# Patient Record
Sex: Male | Born: 1935 | Race: White | Hispanic: No | Marital: Married | Smoking: Former smoker
Health system: Southern US, Community
[De-identification: ages and names within clinical notes are randomized; demographics above are authoritative.]

## PROBLEM LIST (undated history)

## (undated) DIAGNOSIS — R0602 Shortness of breath: Secondary | ICD-10-CM

## (undated) DIAGNOSIS — I4891 Unspecified atrial fibrillation: Secondary | ICD-10-CM

## (undated) DIAGNOSIS — R1032 Left lower quadrant pain: Secondary | ICD-10-CM

## (undated) DIAGNOSIS — R739 Hyperglycemia, unspecified: Principal | ICD-10-CM

## (undated) DIAGNOSIS — I639 Cerebral infarction, unspecified: Secondary | ICD-10-CM

## (undated) DIAGNOSIS — H6120 Impacted cerumen, unspecified ear: Secondary | ICD-10-CM

## (undated) DIAGNOSIS — I714 Abdominal aortic aneurysm, without rupture, unspecified: Secondary | ICD-10-CM

## (undated) DIAGNOSIS — R29898 Other symptoms and signs involving the musculoskeletal system: Principal | ICD-10-CM

## (undated) DIAGNOSIS — S60519A Abrasion of unspecified hand, initial encounter: Secondary | ICD-10-CM

## (undated) DIAGNOSIS — I350 Nonrheumatic aortic (valve) stenosis: Secondary | ICD-10-CM

## (undated) DIAGNOSIS — M79604 Pain in right leg: Secondary | ICD-10-CM

## (undated) DIAGNOSIS — G459 Transient cerebral ischemic attack, unspecified: Secondary | ICD-10-CM

## (undated) DIAGNOSIS — G609 Hereditary and idiopathic neuropathy, unspecified: Secondary | ICD-10-CM

## (undated) DIAGNOSIS — R269 Unspecified abnormalities of gait and mobility: Secondary | ICD-10-CM

## (undated) DIAGNOSIS — E785 Hyperlipidemia, unspecified: Secondary | ICD-10-CM

## (undated) DIAGNOSIS — R2681 Unsteadiness on feet: Secondary | ICD-10-CM

## (undated) DIAGNOSIS — M48061 Spinal stenosis, lumbar region without neurogenic claudication: Secondary | ICD-10-CM

## (undated) DIAGNOSIS — M1990 Unspecified osteoarthritis, unspecified site: Secondary | ICD-10-CM

## (undated) DIAGNOSIS — G4700 Insomnia, unspecified: Secondary | ICD-10-CM

## (undated) DIAGNOSIS — I5032 Chronic diastolic (congestive) heart failure: Secondary | ICD-10-CM

## (undated) DIAGNOSIS — Z0000 Encounter for general adult medical examination without abnormal findings: Secondary | ICD-10-CM

## (undated) DIAGNOSIS — R1031 Right lower quadrant pain: Secondary | ICD-10-CM

## (undated) DIAGNOSIS — I2510 Atherosclerotic heart disease of native coronary artery without angina pectoris: Secondary | ICD-10-CM

## (undated) DIAGNOSIS — F329 Major depressive disorder, single episode, unspecified: Secondary | ICD-10-CM

## (undated) DIAGNOSIS — L989 Disorder of the skin and subcutaneous tissue, unspecified: Principal | ICD-10-CM

## (undated) DIAGNOSIS — R609 Edema, unspecified: Secondary | ICD-10-CM

## (undated) HISTORY — DX: Hereditary and idiopathic neuropathy, unspecified: G60.9

## (undated) HISTORY — DX: Unspecified abnormalities of gait and mobility: R26.9

## (undated) HISTORY — DX: Unspecified atrial fibrillation: I48.91

## (undated) HISTORY — DX: Hyperlipidemia, unspecified: E78.5

## (undated) HISTORY — DX: Abdominal aortic aneurysm, without rupture, unspecified: I71.40

## (undated) HISTORY — DX: Shortness of breath: R06.02

## (undated) HISTORY — DX: Cerebral infarction, unspecified: I63.9

## (undated) HISTORY — DX: Chronic diastolic (congestive) heart failure: I50.32

## (undated) HISTORY — DX: Spinal stenosis, lumbar region without neurogenic claudication: M48.061

## (undated) HISTORY — PX: SPINE SURGERY: SHX786

## (undated) HISTORY — DX: Pain in right leg: M79.604

## (undated) HISTORY — DX: Atherosclerotic heart disease of native coronary artery without angina pectoris: I25.10

## (undated) HISTORY — DX: Unspecified osteoarthritis, unspecified site: M19.90

## (undated) HISTORY — PX: LUMBAR LAMINECTOMY: SHX95

## (undated) HISTORY — DX: Transient cerebral ischemic attack, unspecified: G45.9

## (undated) HISTORY — DX: Insomnia, unspecified: G47.00

## (undated) HISTORY — PX: JOINT REPLACEMENT: SHX530

## (undated) HISTORY — PX: ACHILLES TENDON REPAIR: SUR1153

## (undated) HISTORY — PX: OTHER SURGICAL HISTORY: SHX169

## (undated) HISTORY — DX: Disorder of the skin and subcutaneous tissue, unspecified: L98.9

## (undated) HISTORY — DX: Encounter for general adult medical examination without abnormal findings: Z00.00

## (undated) HISTORY — DX: Hyperglycemia, unspecified: R73.9

## (undated) HISTORY — PX: BRAIN SURGERY: SHX531

## (undated) HISTORY — DX: Other symptoms and signs involving the musculoskeletal system: R29.898

## (undated) HISTORY — DX: Right lower quadrant pain: R10.31

## (undated) HISTORY — DX: Unsteadiness on feet: R26.81

## (undated) HISTORY — DX: Major depressive disorder, single episode, unspecified: F32.9

## (undated) HISTORY — DX: Abrasion of unspecified hand, initial encounter: S60.519A

## (undated) HISTORY — DX: Nonrheumatic aortic (valve) stenosis: I35.0

## (undated) HISTORY — DX: Abdominal aortic aneurysm, without rupture: I71.4

## (undated) HISTORY — DX: Edema, unspecified: R60.9

## (undated) HISTORY — DX: Left lower quadrant pain: R10.32

## (undated) HISTORY — PX: CARPAL TUNNEL RELEASE: SHX101

## (undated) HISTORY — DX: Impacted cerumen, unspecified ear: H61.20

---

## 2005-12-17 ENCOUNTER — Emergency Department (HOSPITAL_COMMUNITY): Admission: EM | Admit: 2005-12-17 | Discharge: 2005-12-17 | Payer: Self-pay | Admitting: *Deleted

## 2008-01-21 ENCOUNTER — Inpatient Hospital Stay (HOSPITAL_COMMUNITY): Admission: AD | Admit: 2008-01-21 | Discharge: 2008-01-22 | Payer: Self-pay | Admitting: General Surgery

## 2008-02-17 ENCOUNTER — Ambulatory Visit (HOSPITAL_BASED_OUTPATIENT_CLINIC_OR_DEPARTMENT_OTHER): Admission: RE | Admit: 2008-02-17 | Discharge: 2008-02-17 | Payer: Self-pay | Admitting: Orthopedic Surgery

## 2008-10-27 LAB — HM COLONOSCOPY

## 2008-11-06 ENCOUNTER — Emergency Department (HOSPITAL_COMMUNITY): Admission: EM | Admit: 2008-11-06 | Discharge: 2008-11-06 | Payer: Self-pay | Admitting: Emergency Medicine

## 2008-12-27 ENCOUNTER — Encounter: Admission: RE | Admit: 2008-12-27 | Discharge: 2009-01-22 | Payer: Self-pay | Admitting: Neurology

## 2009-01-22 ENCOUNTER — Encounter: Admission: RE | Admit: 2009-01-22 | Discharge: 2009-01-22 | Payer: Self-pay | Admitting: *Deleted

## 2009-08-20 ENCOUNTER — Encounter: Admission: RE | Admit: 2009-08-20 | Discharge: 2009-08-20 | Payer: Self-pay | Admitting: Internal Medicine

## 2010-03-31 ENCOUNTER — Encounter: Payer: Self-pay | Admitting: Family Medicine

## 2010-04-03 ENCOUNTER — Encounter: Admission: RE | Admit: 2010-04-03 | Discharge: 2010-04-03 | Payer: Self-pay | Admitting: Family Medicine

## 2010-04-03 ENCOUNTER — Encounter: Payer: Self-pay | Admitting: Family Medicine

## 2010-07-26 ENCOUNTER — Encounter: Admission: RE | Admit: 2010-07-26 | Discharge: 2010-08-19 | Payer: Self-pay | Admitting: Neurology

## 2010-08-12 ENCOUNTER — Ambulatory Visit: Payer: Self-pay | Admitting: Family Medicine

## 2010-08-12 DIAGNOSIS — G609 Hereditary and idiopathic neuropathy, unspecified: Secondary | ICD-10-CM

## 2010-08-12 DIAGNOSIS — H9190 Unspecified hearing loss, unspecified ear: Secondary | ICD-10-CM | POA: Insufficient documentation

## 2010-08-12 DIAGNOSIS — M48061 Spinal stenosis, lumbar region without neurogenic claudication: Secondary | ICD-10-CM

## 2010-08-12 DIAGNOSIS — I714 Abdominal aortic aneurysm, without rupture, unspecified: Secondary | ICD-10-CM | POA: Insufficient documentation

## 2010-08-12 DIAGNOSIS — G459 Transient cerebral ischemic attack, unspecified: Secondary | ICD-10-CM | POA: Insufficient documentation

## 2010-08-12 DIAGNOSIS — D126 Benign neoplasm of colon, unspecified: Secondary | ICD-10-CM | POA: Insufficient documentation

## 2010-08-12 DIAGNOSIS — R011 Cardiac murmur, unspecified: Secondary | ICD-10-CM

## 2010-08-12 DIAGNOSIS — E785 Hyperlipidemia, unspecified: Secondary | ICD-10-CM

## 2010-08-12 DIAGNOSIS — R7309 Other abnormal glucose: Secondary | ICD-10-CM

## 2010-08-12 DIAGNOSIS — Z8619 Personal history of other infectious and parasitic diseases: Secondary | ICD-10-CM

## 2010-08-12 DIAGNOSIS — Z9189 Other specified personal risk factors, not elsewhere classified: Secondary | ICD-10-CM | POA: Insufficient documentation

## 2010-08-12 DIAGNOSIS — H9319 Tinnitus, unspecified ear: Secondary | ICD-10-CM | POA: Insufficient documentation

## 2010-08-12 DIAGNOSIS — R269 Unspecified abnormalities of gait and mobility: Secondary | ICD-10-CM

## 2010-08-12 HISTORY — DX: Spinal stenosis, lumbar region without neurogenic claudication: M48.061

## 2010-08-12 HISTORY — DX: Hereditary and idiopathic neuropathy, unspecified: G60.9

## 2010-08-12 HISTORY — DX: Unspecified abnormalities of gait and mobility: R26.9

## 2010-08-12 HISTORY — DX: Transient cerebral ischemic attack, unspecified: G45.9

## 2010-09-11 ENCOUNTER — Telehealth (INDEPENDENT_AMBULATORY_CARE_PROVIDER_SITE_OTHER): Payer: Self-pay | Admitting: *Deleted

## 2010-09-25 ENCOUNTER — Encounter: Payer: Self-pay | Admitting: Family Medicine

## 2010-11-01 ENCOUNTER — Telehealth: Payer: Self-pay | Admitting: Family Medicine

## 2010-11-19 ENCOUNTER — Encounter: Payer: Self-pay | Admitting: Family Medicine

## 2010-11-20 ENCOUNTER — Encounter: Payer: Self-pay | Admitting: Family Medicine

## 2010-11-20 ENCOUNTER — Ambulatory Visit: Admission: RE | Admit: 2010-11-20 | Discharge: 2010-11-20 | Payer: Self-pay | Source: Home / Self Care

## 2010-11-26 NOTE — Miscellaneous (Signed)
Summary: Appointment Canceled  Appointment status changed to canceled by [employer] on 09/25/2010 3:03 PM.  Cancellation Comments --------------------- echo/heart murmur-mb  Appointment Information ----------------------- Appt Type:  CARDIOLOGY ANCILLARY VISIT      Date:  Friday, November 22, 2010      Time:  3:00 PM for 60 min   Urgency:  Routine   Made By:  Pearson Grippe  To Visit:  [id number]    Reason:  echo/heart murmur-mb  Appt Comments ------------- -- 09/25/10 15:03: ([facility]) CANCELED -- echo/heart murmur-mb -- 08/14/10 14:12: ([facility]) BOOKED -- Routine CARDIOLOGY ANCILLARY VISIT at 11/22/2010 3:00 PM for 60 min echo/heart murmur-mb

## 2010-11-26 NOTE — Progress Notes (Signed)
Summary: Pt called and needs clarification on letter rcvd from Dr Abner Greenspan  Phone Note Call from Patient Call back at [facility] Phone [phone]   Caller: Patient Summary of Call: Pt called and said that he has some questions re: information he needs obtain before next office visit. He rcvd a letter from Dr Abner Greenspan and pt does not understand. He is req clarification. Pls call asap.  Initial call taken by: Lucy Antigua,  September 11, 2010 11:26 AM  Follow-up for Phone Call        Patient informed. Follow-up by: Josph Macho RMA,  September 11, 2010 12:04 PM

## 2010-11-28 ENCOUNTER — Ambulatory Visit: Admit: 2010-11-28 | Payer: Self-pay | Admitting: Family Medicine

## 2010-11-28 ENCOUNTER — Other Ambulatory Visit: Payer: MEDICARE

## 2010-11-28 ENCOUNTER — Encounter (INDEPENDENT_AMBULATORY_CARE_PROVIDER_SITE_OTHER): Payer: Self-pay | Admitting: *Deleted

## 2010-11-28 ENCOUNTER — Other Ambulatory Visit: Payer: Self-pay | Admitting: Family Medicine

## 2010-11-28 DIAGNOSIS — E785 Hyperlipidemia, unspecified: Secondary | ICD-10-CM

## 2010-11-28 DIAGNOSIS — R7309 Other abnormal glucose: Secondary | ICD-10-CM

## 2010-11-28 DIAGNOSIS — R011 Cardiac murmur, unspecified: Secondary | ICD-10-CM

## 2010-11-28 LAB — BASIC METABOLIC PANEL
BUN: 21 mg/dL (ref 6–23)
Chloride: 102 mEq/L (ref 96–112)
Creatinine, Ser: 0.8 mg/dL (ref 0.4–1.5)
GFR: 97.98 mL/min (ref >60.00)
Glucose, Bld: 95 mg/dL (ref 70–99)
Sodium: 136 mEq/L (ref 135–145)

## 2010-11-28 LAB — HEPATIC FUNCTION PANEL
ALT: 17 U/L (ref 0–53)
Total Bilirubin: 0.8 mg/dL (ref 0.3–1.2)

## 2010-11-28 LAB — LIPID PANEL
Cholesterol: 141 mg/dL (ref 0–200)
LDL Cholesterol: 69 mg/dL (ref 0–99)
Total CHOL/HDL Ratio: 3
Triglycerides: 140 mg/dL (ref 0.0–149.0)
VLDL: 28 mg/dL (ref 0.0–40.0)

## 2010-11-28 LAB — CBC WITH DIFFERENTIAL/PLATELET
Basophils Absolute: 0 10*3/uL (ref 0.0–0.1)
Basophils Relative: 0.5 % (ref 0.0–3.0)
Eosinophils Absolute: 0.1 10*3/uL (ref 0.0–0.7)
MCV: 94.3 fl (ref 78.0–100.0)
Monocytes Relative: 7.9 % (ref 3.0–12.0)
Platelets: 197 10*3/uL (ref 150.0–400.0)
RBC: 4.59 Mil/uL (ref 4.22–5.81)

## 2010-11-28 NOTE — Miscellaneous (Signed)
Summary: Orders Update  Clinical Lists Changes  Orders: Added new Test order of Abdominal Aorta Duplex (Abd Aorta Duplex) - Signed 

## 2010-11-28 NOTE — Assessment & Plan Note (Signed)
Summary: NEW PT TO EST//SLM   Vital Signs:  Patient profile:   77 year old male Height:      76 inches (193.04 cm) Weight:      226 pounds (102.73 kg) BMI:     27.61 O2 Sat:      97 % on Room air Temp:     97.4 degrees F (36.33 degrees C) oral Pulse rate:   103 / minute BP sitting:   132 / 68  (left arm) Cuff size:   large  Vitals Entered By: Josph Macho RMA (August 12, 2010 2:22 PM)  O2 Flow:  Room air CC: Establish new patient/ CF Is Patient Diabetic? No   History of Present Illness: Patient in today for new patient vist. He generally feels well but does have a complex medical history. He has been through multiple orthopaedic surgeries for spinal stenosis, and arthritis. He reports surgical intervention [place] at L1 thru L4 without hardware being left in placeHas had lumbar surgeries and is struggling with peripheral neuropathy. He has noted some difficulty with unsteady gait recently due to this but no actual falls and no vertiginous or central nervous system concerns. Denies any CP/palp/SOB/f/c/GI or GU c/o today. No f/c/recent illness  Preventive Screening-Counseling & Management  Alcohol-Tobacco     Smoking Status: quit      Drug Use:  no.    Current Problems (verified): 1)  Cardiac Murmur  (ICD-785.2) 2)  Colonic Polyps  (ICD-211.3) 3)  Hyperglycemia  (ICD-790.29) 4)  Abdominal Aortic Aneurysm  (ICD-441.4) 5)  Measles, Hx of  (ICD-V12.09) 6)  Chickenpox, Hx of  (ICD-V15.9) 7)  Decreased Hearing, Right Ear  (ICD-389.9) 8)  Unsteady Gait  (ICD-781.2) 9)  Tia  (ICD-435.9) 10)  Hyperlipidemia  (ICD-272.4) 11)  Tinnitus  (ICD-388.30) 12)  Spinal Stenosis, Lumbar  (ICD-724.02) 13)  Peripheral Neuropathy  (ICD-356.9)  Current Medications (verified): 1)  Plavix 75 Mg Tabs (Clopidogrel Bisulfate) .... Once Daily 2)  Simvastatin 80 Mg Tabs (Simvastatin) .Marland Kitchen.. 1 Per Day 3)  Gabapentin 100 Mg Caps (Gabapentin) .... Three Times A Day 4)   Metanx 3-35-2 Mg Tabs (L-Methylfolate-B6-B12) .... One Twice A Day 5)  Coenzyme Q10 200 Mg Tabs (Coenzyme Q10) .... Once Daily 6)  Fish Oil 500mg  .... 2 Tablets Daily  Allergies (verified): No Known Drug Allergies  Past History:  Past Surgical History: Total knee replacement, b/l right thumb tendon severed and repaired, tendon snapped Carpal tunnel release on right Achilles tendon repair on left, repaired with wire Lumbar laminectomy Brain surgeries done by Dr Peter Garter for tinnitius, unsuccessful, only on right. Inguinal herniorrhaphy on right years ago and then 2 on the left  Family History: Father: deceased@88 , old age, CHF Mother: deceased@83 , CHF,  venous stasis ulcers Siblings:  P1/2sister: deceased at 24, cancer, unknown MGM: deceasedin late 75s, hip fracture, pneumonia MGF: deceased, unknown history PGM: deceased, unknown history PGF: deceased@97 , old age Children: Daughter: 20: A&W Duaghter: 5, A&W Son: 7, A&W  Social History: Retired from [employer] Married Former Smoker, quit 1961 after 3 ppd habit Drug use-no Alcohol use-yes. 1-2 beer daily Seat belt use regularly Smoking Status:  quit Drug Use:  no  Review of Systems      See HPI       The patient complains of difficulty walking.  The patient denies anorexia, fever, weight loss, weight gain, vision loss, decreased hearing, hoarseness, chest pain, syncope, dyspnea on exertion, peripheral edema, prolonged cough, headaches, hemoptysis,  abdominal pain, melena, hematochezia, severe indigestion/heartburn, hematuria, incontinence, muscle weakness, suspicious skin lesions, transient blindness, depression, unusual weight change, and abnormal bleeding.         Flu Vaccine Consent Questions     Do you have a history of severe allergic reactions to this vaccine? no    Any prior history of allergic reactions to egg and/or gelatin? no    Do you have a sensitivity to the preservative Thimersol? no    Do you  have a past history of Guillan-Barre Syndrome? no    Do you currently have an acute febrile illness? no    Have you ever had a severe reaction to latex? no    Vaccine information given and explained to patient? yes    Are you currently pregnant? no    Lot Number:[id number]   Exp Date:04/26/2011   Site Given  Left Deltoid IM Josph Macho RMA  August 12, 2010 2:58 PM   Physical Exam  General:  Well-developed,well-nourished,in no acute distress; alert,appropriate and cooperative throughout examination Head:  Normocephalic and atraumatic without obvious abnormalities. No apparent alopecia or balding. Eyes:  No corneal or conjunctival inflammation noted. EOMI. Perrla.  Ears:  External ear exam shows no significant lesions or deformities.  Otoscopic examination reveals clear canals, tympanic membranes are intact bilaterally without bulging, retraction, inflammation or discharge. Hearing is grossly normal bilaterally. Nose:  External nasal examination shows no deformity or inflammation. Nasal mucosa are pink and moist without lesions or exudates. Mouth:  Oral mucosa and oropharynx without lesions or exudates.  Teeth in good repair. Neck:  No deformities, masses, or tenderness noted. Lungs:  Normal respiratory effort, chest expands symmetrically. Lungs are clear to auscultation, no crackles or wheezes. Heart:  Normal rate and regular rhythm. S1 and S2 normal without gallop, click, rub or other extra sounds.grade  1/6 systolic murmur.   Abdomen:  Bowel sounds positive,abdomen soft and non-tender without masses, organomegaly or hernias noted. Msk:  No deformity or scoliosis noted of thoracic or lumbar spine.   Pulses:  R and L carotid,dorsalis pedis and posterior tibial pulses are full and equal bilaterally Extremities:  No clubbing, cyanosis, edema, or deformity noted with normal full range of motion of all joints.   Neurologic:  No cranial nerve deficits noted. Station and gait are normal.  Plantar reflexes are down-going bilaterally. DTRs are symmetrical throughout. Sensory, motor and coordinative functions appear intact. Skin:  Intact without suspicious lesions or rashes Cervical Nodes:  No lymphadenopathy noted Psych:  Cognition and judgment appear intact. Alert and cooperative with normal attention span and concentration. No apparent delusions, illusions, hallucinations   Impression & Recommendations:  Problem # 1:  HYPERGLYCEMIA (ICD-790.29) Check a fasting renal prior to next visit and avoid simple carbs  Problem # 2:  CARDIAC MURMUR (ICD-785.2)  Orders: Echo Referral (Echo) Denies ever having this worked up or evaluated, will proceed with echo patient will report if he becomes symptomatic  Problem # 3:  ABDOMINAL AORTIC ANEURYSM (ICD-441.4)  Orders: Radiology Referral (Radiology) Needs surveillance check [place] prior to next visit  Problem # 4:  UNSTEADY GAIT (ICD-781.2) Is already engaged in some PT will cont with same  Problem # 5:  PERIPHERAL NEUROPATHY (ICD-356.9) Long standing is following with neurosurgeon. No change to meds  Complete Medication List: 1)  Plavix 75 Mg Tabs (Clopidogrel bisulfate) .Marland Kitchen.. 1 tab by mouth qhs 2)  Gabapentin 100 Mg Caps (Gabapentin) .... Three times a day 3)  Metanx 3-35-2 Mg Tabs (L-methylfolate-b6-b12) .Marland KitchenMarland KitchenMarland Kitchen  One twice a day 4)  Coenzyme Q10 200 Mg Tabs (Coenzyme q10) .... Once daily 5)  Fish Oil 500mg   .... 2 tablets daily 6)  Simvastatin 40 Mg Tabs (Simvastatin) .Marland Kitchen.. 1 tab by mouth qhs  Other Orders: Flu Vaccine 27yrs + MEDICARE PATIENTS (U0454) Administration Flu vaccine - MCR (U9811)  Patient Instructions: 1)  Please schedule a follow-up appointment in 3 to 4 months .  2)  BMP prior to visit, ICD-9: 790.29 3)  Hepatic Panel prior to visit ICD-9: 790.29 4)  Lipid panel prior to visit ICD-9 : 272.4 5)  TSH prior to visit ICD-9 : 785.2 6)  CBC w/ Diff prior to visit ICD-9 : 785.2 Prescriptions: SIMVASTATIN 40 MG TABS  (SIMVASTATIN) 1 tab by mouth qhs  #90 x 3   Entered and Authorized by:   Danise Edge MD   Signed by:   Danise Edge MD on 08/12/2010   Method used:   Print then Give to Patient   RxID:   [id number] PLAVIX 75 MG TABS (CLOPIDOGREL BISULFATE) 1 tab by mouth qhs  #90 x 3   Entered and Authorized by:   Danise Edge MD   Signed by:   Danise Edge MD on 08/12/2010   Method used:   Print then Give to Patient   RxID:   [id number] SIMVASTATIN 80 MG TABS (SIMVASTATIN) 1 tab by mouth at bedtime  #90 x 3   Entered and Authorized by:   Danise Edge MD   Signed by:   Danise Edge MD on 08/12/2010   Method used:   Print then Give to Patient   RxID:   [id number]    Orders Added: 1)  Flu Vaccine 54yrs + MEDICARE PATIENTS [Q2039] 2)  Administration Flu vaccine - MCR [G0008] 3)  Echo Referral [Echo] 4)  Radiology Referral [Radiology] 5)  New Patient Level IV [74259]    Preventive Care Screening  Last Pneumovax:    Date:  06/27/2009    Results:  historical   Colonoscopy:    Date:  10/27/2008    Results:  historical   Appended Document: Orders Update    Clinical Lists Changes  Orders: Added new Referral order of Echo Referral (Echo) - Signed Added new Referral order of Radiology Referral (Radiology) - Signed

## 2010-11-28 NOTE — Progress Notes (Signed)
Summary: Pt referral   Phone Note Outgoing Call   Call placed by: Lannette Donath,  November 01, 2010 2:12 PM Summary of Call: Pt is refusing echo for cardiac murmur, pt agreed to abdominal u/s Initial call taken by: Lannette Donath,  November 01, 2010 2:19 PM  Follow-up for Phone Call        OK Follow-up by: Danise Edge MD,  November 01, 2010 3:42 PM

## 2010-12-06 ENCOUNTER — Ambulatory Visit: Payer: Self-pay | Admitting: Family Medicine

## 2010-12-09 ENCOUNTER — Encounter: Payer: Self-pay | Admitting: Family Medicine

## 2010-12-09 ENCOUNTER — Ambulatory Visit: Payer: Self-pay | Admitting: Family Medicine

## 2010-12-09 ENCOUNTER — Ambulatory Visit (INDEPENDENT_AMBULATORY_CARE_PROVIDER_SITE_OTHER): Payer: MEDICARE | Admitting: Family Medicine

## 2010-12-09 DIAGNOSIS — R011 Cardiac murmur, unspecified: Secondary | ICD-10-CM

## 2010-12-09 DIAGNOSIS — I714 Abdominal aortic aneurysm, without rupture: Secondary | ICD-10-CM

## 2010-12-09 DIAGNOSIS — G609 Hereditary and idiopathic neuropathy, unspecified: Secondary | ICD-10-CM

## 2010-12-09 DIAGNOSIS — E785 Hyperlipidemia, unspecified: Secondary | ICD-10-CM

## 2010-12-09 DIAGNOSIS — M48061 Spinal stenosis, lumbar region without neurogenic claudication: Secondary | ICD-10-CM

## 2010-12-18 NOTE — Assessment & Plan Note (Signed)
Summary: 4 month follow up/ njr   Vital Signs:  Patient profile:   77 year old male Height:      76 inches (193.04 cm) Weight:      226 pounds (102.73 kg) O2 Sat:      96 % on Room air Temp:     97.5 degrees F (36.39 degrees C) oral Pulse rate:   75 / minute BP sitting:   119 / 72  (right arm) Cuff size:   large  Vitals Entered By: Josph Macho RMA (December 09, 2010 2:42 PM)  O2 Flow:  Room air CC: 4 month follow up/ CF Is Patient Diabetic? No   History of Present Illness: patient is an 77 year old Caucasian male today for follow up examination. He reports feeling well since his last visit and denies any recent illness. He, congestion, fevers, chills, chest pain, palpitations, shortness of breath, abdominal pain, GI or GU symptoms last seen. He reports his bowels are moving well he is eating well in the ofers no newcomplaints. he continues to still with peripheral neuropathy and bilateral lower and extremities secondary to his spinal stenosis in the lumbar spine. He is following with neurology and is presently taking gabapentin 100 mg t.i.d. but does not notice any significant improvement in his symptoms. He also denies any untoward side effects, no somnolence GI upset or troubles with that gabapentin are noted. He has an appointment with neurology in the near future to discuss further options. Symptoms are not worsening   Current Medications (verified): 1)  Plavix 75 Mg Tabs (Clopidogrel Bisulfate) .Marland Kitchen.. 1 Tab By Mouth Qhs 2)  Gabapentin 100 Mg Caps (Gabapentin) .... Three Times A Day 3)  Metanx 3-35-2 Mg Tabs (L-Methylfolate-B6-B12) .... One Twice A Day 4)  Coenzyme Q10 200 Mg Tabs (Coenzyme Q10) .... Once Daily 5)  Fish Oil 500mg  .... 2 Tablets Daily 6)  Simvastatin 40 Mg Tabs (Simvastatin) .Marland Kitchen.. 1 Tab By Mouth Qhs  Allergies (verified): No Known Drug Allergies  Past History:  Past medical history reviewed for relevance to current acute and chronic problems. Social history  (including risk factors) reviewed for relevance to current acute and chronic problems.  Social History: Reviewed history from 08/12/2010 and no changes required. Retired from [employer] Married Former Smoker, quit 1961 after 3 ppd habit Drug use-no Alcohol use-yes. 1-2 beer daily Seat belt use regularly  Review of Systems      See HPI  Physical Exam  General:  Well-developed,well-nourished,in no acute distress; alert,appropriate and cooperative throughout examination Head:  Normocephalic and atraumatic without obvious abnormalities. No apparent alopecia or balding. Lungs:  Normal respiratory effort, chest expands symmetrically. Lungs are clear to auscultation, no crackles or wheezes. Heart:  Normal rate and regular rhythm. S1 and S2 normal without gallop, click, rub or other extra sounds.grade 2 /6 systolic murmur.   Abdomen:  Bowel sounds positive,abdomen soft and non-tender without masses, or organomegaly noted Extremities:  No clubbing, cyanosis, edema, or deformity noted with normal full range of motion of all joints.   Skin:  Intact without suspicious lesions or rashes   Impression & Recommendations:  Problem # 1:  ABDOMINAL AORTIC ANEURYSM (ICD-441.4)  Enlarging patient agrees to vascular consultation due to the increasing size of his Anuerysm. Will refer foor further evaluation and he will seek immediate care if worsening symptoms including abdominal pain, palp, CP, SOB develops  Orders: Vascular Clinic (Vascular)  Problem # 2:  PERIPHERAL NEUROPATHY (ICD-356.9) Is seeing neurology and was recently started  on Gabapentin for his neuropathy. Has not had any difficulty with the  medications but he does not feel they are helping. He has appt soon and may discuss the possiblity of increasing his dosing.  Problem # 3:  HYPERLIPIDEMIA (ICD-272.4)  His updated medication list for this problem includes:    Simvastatin 40 Mg Tabs (Simvastatin) .Marland Kitchen... 1 tab by mouth  qhs Tolerating Simvastatin and good FLP noted. Patient given a copy of his labs and asked to maintain current dosing of meds  Complete Medication List: 1)  Plavix 75 Mg Tabs (Clopidogrel bisulfate) .Marland Kitchen.. 1 tab by mouth qhs 2)  Gabapentin 100 Mg Caps (Gabapentin) .... Three times a day 3)  Metanx 3-35-2 Mg Tabs (L-methylfolate-b6-b12) .... One twice a day 4)  Coenzyme Q10 200 Mg Tabs (Coenzyme q10) .... Once daily 5)  Fish Oil 500mg   .... 2 tablets daily 6)  Simvastatin 40 Mg Tabs (Simvastatin) .Marland Kitchen.. 1 tab by mouth qhs  Patient Instructions: 1)  Please schedule a follow-up appointment in 4 to 6  months or sooner as needed 2)  We will call in next 1-2 days with Vascular appt.   Orders Added: 1)  Vascular Clinic [Vascular] 2)  Est. Patient Level IV [04540]

## 2011-01-01 ENCOUNTER — Encounter (INDEPENDENT_AMBULATORY_CARE_PROVIDER_SITE_OTHER): Payer: Medicare Other | Admitting: Vascular Surgery

## 2011-01-01 DIAGNOSIS — I714 Abdominal aortic aneurysm, without rupture, unspecified: Secondary | ICD-10-CM

## 2011-01-02 ENCOUNTER — Other Ambulatory Visit: Payer: Self-pay | Admitting: Vascular Surgery

## 2011-01-02 DIAGNOSIS — I714 Abdominal aortic aneurysm, without rupture, unspecified: Secondary | ICD-10-CM

## 2011-01-02 NOTE — Consult Note (Signed)
NEW PATIENT CONSULTATION  Shawn Cortez, Shawn Cortez DOB:  1925-05-01                                       01/01/2011 [id number]#:[id number]  I saw the patient in the office today in consultation concerning a 4.5- cm infrarenal abdominal aortic aneurysm.  He was referred by Dr. Abner Greenspan. This is a pleasant 77 year old gentleman who states that he was originally diagnosed with an aneurysm in February 2004 when it measured 3.5 cm in maximum diameter.  He states that he has had routine follow-up studies every 6 to 8 months and this has remained relatively stable in size.  However, on his most recent ultrasound done at the [facility] office on November 20, 2010, the maximum diameter was 4.5 cm.  Of note, the impression was that this was an infrarenal saccular aneurysm.  He was sent for vascular consultation.  He denies any history of abdominal or back pain.  He is unaware of any family history of aneurysmal disease.  PAST MEDICAL HISTORY:  Significant for hypercholesterolemia, which has been stable on his current medications.  In addition, he has had a previous transient ischemic attack in 2005 and has had no symptoms since that time.  He denies any history of diabetes, history of previous myocardial infarction, history of hypertension, history of congestive heart failure or history of COPD.  SOCIAL HISTORY:  He is married.  He has three children.  He quit tobacco in 1961.  FAMILY HISTORY:  There is no history of premature cardiovascular disease that he is aware of.  REVIEW OF SYSTEMS:  GENERAL:  He had no recent weight loss, weight gain or problems with his appetite. CARDIOVASCULAR:  He had no chest pain, chest pressure, palpitations or arrhythmias.  He gas had no history of DVT or phlebitis. GI, neurologic, pulmonary, hematologic, GU, ENT, psychiatric, integumentary review of systems is unremarkable and is documented on the medical history form in his chart. MUSCULOSKELETAL:  He  does have a history of arthritis.  PHYSICAL EXAMINATION:  This is a pleasant 77 year old gentleman who appears his stated age.  Blood pressure is 162/77, heart rate is 96, respiratory rate is 20.  I have reviewed his records from Dr. Mariel Aloe office.  He also has a history of peripheral neuropathy and is on gabapentin for this.  He is followed also by Dr. Sandria Manly.  I have reviewed his ultrasound from [facility] imaging on April 03, 2010, which showed that the maximum diameter of his aneurysm was 4.0 cm.  I have also reviewed his most recent ultrasound from the [facility] office, which showed the maximum diameter was 4.5 cm.  I explained that in a normal-risk patient we would normally consider elective repair of an aneurysm at 5.5 cm.  Therefore, his aneurysm is clearly not large enough to consider repair except that on his most recent ultrasound it was noted that he had a saccular aneurysm. Depending on the morphology of a saccular aneurysm, these may be at slightly higher risk for rupture and for this reason I have recommended that we obtain a CT scan to see if this is, in fact, a saccular aneurysm.  Sometimes this term is used loosely, and I suspect that he has a normal fusiform aneurysm.  However, given that the aneurysm has enlarged, we would feel safest to go ahead and sort this out.  This will also provide some information so that  we will know if in the future he does require elective repair, whether or not he is a candidate for endovascular repair.  We will arrange for his CAT scan and I will see him back after this study.    Di Kindle. Edilia Bo, M.D. Electronically Signed  CSD/MEDQ  D:  01/01/2011  T:  01/02/2011  Job:  [id number]

## 2011-01-15 ENCOUNTER — Ambulatory Visit (INDEPENDENT_AMBULATORY_CARE_PROVIDER_SITE_OTHER): Payer: Medicare Other | Admitting: Vascular Surgery

## 2011-01-15 ENCOUNTER — Ambulatory Visit
Admission: RE | Admit: 2011-01-15 | Discharge: 2011-01-15 | Disposition: A | Discharge status: Home / Self Care | Payer: Medicare Other | Source: Ambulatory Visit | Attending: Vascular Surgery | Admitting: Vascular Surgery

## 2011-01-15 DIAGNOSIS — I714 Abdominal aortic aneurysm, without rupture, unspecified: Secondary | ICD-10-CM

## 2011-01-15 MED ORDER — IOHEXOL 300 MG/ML  SOLN
100.0000 mL | Freq: Once | INTRAMUSCULAR | Status: AC | PRN
  Administered 2011-01-15: 100 mL via INTRAVENOUS

## 2011-01-16 NOTE — Assessment & Plan Note (Signed)
OFFICE VISIT  Shawn Cortez, Shawn Cortez DOB:  Jun 09, 1925                                       01/15/2011 JYNWG#:[id number]  I saw the patient in the office today for follow-up after his CT scan of his abdomen and pelvis.  This is a pleasant 77 year old gentleman who I saw on January 01, 2011, with a 4.5-cm infrarenal abdominal aortic aneurysm by ultrasound that was done at the [place] office.  The report on his ultrasound suggested that this was a saccular aneurysm.  I explained to the patient at that time that sometimes the term "saccular" is used loosely; however, if it is truly a saccular aneurysm, it is associated with increased risk of rupture as compared to a fusiform aneurysm.  For this reason I have recommend a CT scan to sort this out.  He comes in today to discuss those results.  I have reviewed his CT scan from today, which shows that this is a fusiform 4.5-cm infrarenal abdominal aortic aneurysm.  His right common iliac artery measures 22 mm in maximal diameter and the left common iliac 21 mm in maximum diameter.  PHYSICAL EXAMINATION:  On examination, blood pressure is 121/71, heart rate is 101, respiratory rate is 22.  Lungs are clear bilaterally to auscultation.  Abdomen is soft and nontender.  He has palpable femoral pulses.  I reassured the patient that he does not have a saccular aneurysm and that this is a standard fusiform aneurysm and that we would not consider elective repair unless it reached 5.5 cm in maximum diameter.  I have ordered a follow-up ultrasound in 6 months and I will see him back at that time.  He knows to call sooner if he has problems.    Di Kindle. Edilia Bo, M.D. Electronically Signed  CSD/MEDQ  D:  01/15/2011  T:  01/16/2011  Job:  [id number]  cc:   Dr. Abner Greenspan

## 2011-02-10 LAB — URINALYSIS, ROUTINE W REFLEX MICROSCOPIC
Bilirubin Urine: NEGATIVE
Hgb urine dipstick: NEGATIVE
Protein, ur: NEGATIVE mg/dL
Urobilinogen, UA: 0.2 mg/dL (ref 0.0–1.0)

## 2011-02-10 LAB — CBC
Hemoglobin: 15.5 g/dL (ref 13.0–17.0)
MCHC: 33.5 g/dL (ref 30.0–36.0)
Platelets: 194 10*3/uL (ref 150–400)
RDW: 14.1 % (ref 11.5–15.5)
WBC: 8.2 10*3/uL (ref 4.0–10.5)

## 2011-02-10 LAB — COMPREHENSIVE METABOLIC PANEL
ALT: 22 U/L (ref 0–53)
Albumin: 3.9 g/dL (ref 3.5–5.2)
BUN: 17 mg/dL (ref 6–23)
CO2: 22 mEq/L (ref 19–32)
Calcium: 9.3 mg/dL (ref 8.4–10.5)
Chloride: 101 mEq/L (ref 96–112)
GFR calc Af Amer: >60 mL/min (ref >60)
Glucose, Bld: 107 mg/dL — ABNORMAL HIGH (ref 70–99)
Potassium: 4.4 mEq/L (ref 3.5–5.1)
Sodium: 134 mEq/L — ABNORMAL LOW (ref 135–145)

## 2011-02-10 LAB — DIFFERENTIAL
Basophils Absolute: 0 10*3/uL (ref 0.0–0.1)
Eosinophils Absolute: 0 10*3/uL (ref 0.0–0.7)
Lymphs Abs: 1.5 10*3/uL (ref 0.7–4.0)
Monocytes Relative: 8 % (ref 3–12)
Neutro Abs: 6 10*3/uL (ref 1.7–7.7)

## 2011-02-10 LAB — APTT: aPTT: 29 seconds (ref 24–37)

## 2011-03-11 NOTE — Op Note (Signed)
NAME:  Shawn Cortez, Shawn Cortez NO.:  [id number]   MEDICAL RECORD NO.:  [id number]           PATIENT TYPE:   LOCATION:                                 FACILITY:   PHYSICIAN:  Katy Fitch. Sypher, M.D. DATE OF BIRTH:  1925/04/27   DATE OF PROCEDURE:  02/17/2008  DATE OF DISCHARGE:                               OPERATIVE REPORT   PREOPERATIVE DIAGNOSES:  Entrapment neuropathy median nerve, right  carpal tunnel with thenar atrophy and unrecordable latencies, right  wrist also status post transfer of flexor digitorum superficialis of  ring finger to thumb to attempt to reconstruct thumb flexion at the IP  joint.   POSTOPERATIVE DIAGNOSES:  Profound carpal tunnel syndrome.   OPERATION:  Release of right transverse carpal ligament.   OPERATING SURGEON:  Katy Fitch. Sypher, M.D.   ASSISTANT:  Marveen Reeks. Dasnoit, PA-C.   ANESTHESIA:  General by LMA.   SUPERVISING ANESTHESIOLOGIST:  Burna Forts, M.D.   INDICATIONS:  Shawn Cortez is an 77 year old gentleman referred  through the courtesy of Dr. Jacalyn Lefevre of [facility] for  evaluation and management of severe right carpal tunnel syndrome with  thenar atrophy.   Shawn Cortez has a complex past medical history with prior transfer of  his right ring finger flexor digitorum superficialis to the thumb,  flexor pollicis longus in an effort to reconstruct IP flexion following  a rupture of the flexor pollicis longus.   Unfortunately, the tendon transfer was not successful in recovering  motion at the right thumb IP joint.   Shawn Cortez also has a history of wrist injury and developed severe  bilateral carpal tunnel syndrome worse on the right than the left.   Due to failure to respond to nonoperative measures for the carpal tunnel  syndrome and with electrodiagnostic studies demonstrating profound  neuropathy on the right, Shawn Cortez is brought to the operating room  at this time anticipating  decompression of the carpal canal by release  of the transverse carpal ligament.   Preoperatively, he was advised of the multiple underlying factors that  may complicate recovery on the right side.  He has a history of his  prior tendon transfer, which may have altered the kinematics of the  flexor tendons at the wrist and cause pressure on the median nerve.  In  addition, he has had prior wrist injury and due to change in the shape  of his carpal bones he may have a more problematic bony anatomy of the  carpal canal.   Shawn Cortez was noted have unrecordable latencies across the right  wrist.  Therefore, he will require a minimum of 5 months to see recovery  following release of transverse carpal ligament.   After informed consent, he is brought to the operating at this time.   He had been on Plavix for TIA predicament.  He has been off the Plavix  for 4 days.   Questions were invited and answered in detail.  He was noted to be  intolerant of IV contrast dye.   PROCEDURE:  Shawn Cortez is brought to the operating room  and placed  in supine position on the operating table.   Following an anesthesia consult by Dr. Jacklynn Bue, general anesthesia by  LMA technique was recommended and accepted by Shawn Cortez.   He was brought to room 8 of the [facility] placed in the  supine position on the operating table and under Dr. Marlane Mingle direct  supervision general anesthesia by LMA technique induced.   The right arm was prepped with Betadine soap solution, sterilely draped.  A pneumatic tourniquet was applied proximal right brachium.   Upon exsanguination of the right arm with Esmarch bandage, the arterial  tourniquet was inflated to 230 mmHg.  The procedure commenced with a 2.5-  cm incision paralleling the thenar crease directly over the ulnar aspect  of the carpal canal.  Subcutaneous tissues were carefully divided taking  care to identify the palmar fascia.  The palmar  fascia was split in line  of its fibers to reveal the superficial palmar arch and common sensory  branch of the median nerve.  The ulnar artery was identified and Guyon's  canal sounded.  The pisohamate ligament was released subcutaneously for  an incidental release of Guyon's canal.  The transverse carpal ligament  was isolated and a Penfield 4 elevator used to sound the synovium and  clear the synovium off of the deep surface of the transverse carpal  ligament.   Due to the history of prior tendon transfer, great care was taken to  identify the path of the median nerve, which likely was in an abnormal  position following the prior tendon transfer.  Indeed, there was noted  to be a bulge beneath the median nerve probably due to the transfer of  the superficialis muscle causing the nerve to be in a far more ulnar  position than usual.   With great care under direct vision with a headlight, the transverse  carpal ligament was released subcutaneously into the distal forearm.  Care was taken to identify and retract the median nerve while the  transverse carpal ligament was released.  The canal was completely  released followed by identification of definite flattening of the median  nerve with the transverse carpal ligament compressing the nerve against  the transferred superficialis tendon.   The wound was meticulously inspected for bleeding points due to the  history of Plavix use.  These were electrocauterized with bipolar  forceps followed by repair of the skin with intradermal 3-0 Prolene  suture.   The wound was held with manual compression for 7 minutes to assure  hemostasis.   A compressive dressing was applied with a volar plaster splint to  maintain the wrist in 5 degrees dorsiflexion.  A 2% lidocaine was  infiltrated along the wound margins for postoperative analgesia.   Shawn Cortez tolerated surgery and anesthesia well.  He was transferred  to the recovery room with  stable vital signs.  We will see him back in  followup in approximately 1 week for wound inspection and instruction in  exercise program.  For aftercare, he is provided prescription for  Vicodin 5 mg  1 p.o. q. 4-6h, p.r.n. pain 20 tablets without refill.      Katy Fitch Sypher, M.D.  Electronically Signed     RVS/MEDQ  D:  02/17/2008  T:  02/18/2008  Job:  [id number]

## 2011-03-11 NOTE — Op Note (Signed)
NAME:  NICKOLIS, DIEL NO.:  [id number]   MEDICAL RECORD NO.:  [id number]          PATIENT TYPE:  OIB   LOCATION:  1524                         FACILITY:  [facility]   PHYSICIAN:  Adolph Pollack, M.D.DATE OF BIRTH:  1925-07-06   DATE OF PROCEDURE:  01/20/2008  DATE OF DISCHARGE:                               OPERATIVE REPORT   PREOPERATIVE DIAGNOSIS:  Left spigelian (ventral) hernia.   POSTOPERATIVE DIAGNOSIS:  Left spigelian (ventral) hernia.   PROCEDURE:  Laparoscopic repair of left spigelian hernia with mesh.   SURGEON:  Adolph Pollack, M.D.   ANESTHESIA:  General.   INDICATIONS:  This is an 77 year old male who at the end of last year  was having some left lower quadrant pain and constipation.  It was bad  enough that he went to the emergency department and was evaluated with a  CT scan which demonstrated a left spigelian hernia.  He was started on a  bowel regimen, which improved things, but he was still having  intermittent pains.  I saw him in the office, and he now presents for  elective repair.  We have discussed the procedure, risks, and aftercare  preoperatively.   TECHNIQUE:  He was seen in the holding area and the left lower quadrant  region marked.  He was then brought to the operating room, placed supine  on the operating table, and general anesthetic administered.  A Foley  catheter was placed in the bladder (the patient has had a problem with  urinary tension postanesthesia before).  The hair on the abdominal wall  was clipped, and the abdominal wall was then widely prepped and draped.  A small 5 mm incision was made in the right upper quadrant after  infiltration of Marcaine.  Using a 5 mm OptiVu trocar and 5 mm scope, I  entered the peritoneal cavity and created pneumoperitoneum.  I inspected  the area directly below the trocar, and there was no injury of solid or  hollow organ.  I was able to visualize the hernia in the left lower  quadrant, with the left colon up in it.  I then placed a 5 mm trocar in  the right midabdomen and an 11 mm trocar in the lower midline.  Using  careful manipulation, I reduced the colon from the hernia.  I then  divided some the lateral attachments to the descending colon and swept  it medially to allow for more exposure to place the mesh with adequate  overlap.  Using a spinal needle, I then marked the four quadrants of the  mesh and measured 3-4 cm away from these.  This would require an  approximately 12 cm x 10 cm piece of mesh to cover the defect with  adequate overlap.  I brought a piece of Parietex mesh with a hydrophilic  with a nonadherent barrier on one side into the field measuring 15 x 10  and trimmed the mesh.  I then put four anchoring sutures of the #1  Novofil at four quadrants of the mesh.  The mesh was hydrated then  placed into the abdominal cavity.  I then unrolled the mesh, making sure  the nonadherent  barrier side faced the viscera.  I then placed four  stab wounds in the four quadrants around the hernia defect as had been  previously marked and brought up the anchoring sutures through these  incisions, making sure I had a fascial bridge on each of them.  I then  tied these down, initially anchoring the mesh to the abdominal wall,  covering the defect adequately.  I then used spiral tacker to tack the  periphery of the mesh down for further fixation.  This more than  adequate coverage the defect, with good overlap.   Following this, I inspected the area, and hemostasis was adequate.  I  inspected the abdominal cavity, and there was no evidence of injury to  the viscera or solid organs.  I then released CO2 gas and watched the  viscera approximate the nonadherent side of the mesh.  I then removed  all trocars.   All skin incisions were closed with 4-0 Monocryl subcuticular stitches.  Steri-Strips and sterile dressings were applied.   He tolerated the procedure  well, without any apparent complications, and  was taken to the recovery room in satisfactory condition.      Adolph Pollack, M.D.  Electronically Signed     TJR/MEDQ  D:  01/20/2008  T:  01/21/2008  Job:  [id number]   cc:   Bertram Millard. Hyacinth Meeker, M.D.  Fax: [phone]

## 2011-04-21 ENCOUNTER — Telehealth: Payer: Self-pay

## 2011-04-21 NOTE — Telephone Encounter (Signed)
Pt would like a copy of CT results from March 2012 mailed to him. CT results put in mail

## 2011-05-07 ENCOUNTER — Telehealth: Payer: Self-pay

## 2011-05-07 MED ORDER — AMOXICILLIN 500 MG PO TABS: 500.0000 mg | ORAL_TABLET | Freq: Three times a day (TID) | ORAL | Status: DC

## 2011-05-07 NOTE — Telephone Encounter (Signed)
Pt called back and didn't want an antibiotic he wanted a Sedative called in. Per MD pt needs to be seen for this. Call was transferred to Diane and appt made for 1:30 tomorrow afternoon with Dr Milinda Cave

## 2011-05-07 NOTE — Telephone Encounter (Signed)
Pt informed

## 2011-05-07 NOTE — Telephone Encounter (Signed)
RX was called and cancelled

## 2011-05-07 NOTE — Telephone Encounter (Signed)
Patient has agreed to evaluation tomorrow will discontinue med until he is seen

## 2011-05-07 NOTE — Telephone Encounter (Signed)
Pt called and stated he had tinnitus in 1980 and he states its back in his right ear. Pt states he was up all night until about 4:30 this morning.  Pt would like an antibiotic called into [facility] [address]

## 2011-05-07 NOTE — Telephone Encounter (Signed)
OK to try a course of Amoxicillin 500mg  po tid x 7 days if no improvement then he needs to come in for evaluation. Should eat a yogurt or take a probiobitic daily while he is taking this. Disp # 21 no rf

## 2011-05-08 ENCOUNTER — Ambulatory Visit (INDEPENDENT_AMBULATORY_CARE_PROVIDER_SITE_OTHER): Payer: Medicare Other | Admitting: Family Medicine

## 2011-05-08 ENCOUNTER — Other Ambulatory Visit: Payer: Self-pay | Admitting: Family Medicine

## 2011-05-08 ENCOUNTER — Telehealth: Payer: Self-pay | Admitting: *Deleted

## 2011-05-08 ENCOUNTER — Encounter: Payer: Self-pay | Admitting: Family Medicine

## 2011-05-08 VITALS — BP 145/68 | HR 46 | Temp 97.4°F | Ht 76.0 in

## 2011-05-08 DIAGNOSIS — H9319 Tinnitus, unspecified ear: Secondary | ICD-10-CM

## 2011-05-08 DIAGNOSIS — H9311 Tinnitus, right ear: Secondary | ICD-10-CM

## 2011-05-08 MED ORDER — TRAZODONE HCL 50 MG PO TABS: ORAL_TABLET | ORAL | Status: AC

## 2011-05-08 MED ORDER — TRIAZOLAM 0.125 MG PO TABS: ORAL_TABLET | ORAL | Status: DC

## 2011-05-08 NOTE — Telephone Encounter (Signed)
Pt notified.  He will pick up new RX and if it does not help he will call office.

## 2011-05-08 NOTE — Telephone Encounter (Signed)
VM left by pt.  Triazolam is not covered by insurance.  Pt states Zaleplon or Zolpidem Tartrate (5mg  or 10mg ) is covered.  Please call to [facility].

## 2011-05-08 NOTE — Progress Notes (Signed)
OFFICE NOTE  05/08/2011  CC:  Chief Complaint  Patient presents with  . Tinnitus     HPI: Patient is a 77 y.o. Caucasian male who is here for ringing in right ear. Ringing in right ear last week for a few nights, impaired sleep greatly, asks for sedative for hs use prn when/if ringing recurs. No vertigo, no change in chronic hearing loss.  No URI or ear pain.  Pertinent PMH:  Tinnitus, distant hx.  Surgery x 2 unsuccessful.   Tinnitus masker helped x 19yrs and then they no longer made these. Spontaneous resolution for 20+ years until recurrence about a week ago.  Pertinent Meds:   No new meds recently.    PE: Blood pressure 145/68, pulse 46, temperature 97.4 F (36.3 C), temperature source Oral, height 6\' 4"  (1.93 m). Gen: Alert, well appearing.  Patient is oriented to person, place, time, and situation. HEENT: Scalp without lesions or hair loss.  Ears: EACs clear, normal epithelium.  TMs with good light reflex and landmarks bilaterally.  Eyes: no injection, icteris, swelling, or exudate.  EOMI, PERRLA. Nose: no drainage or turbinate edema/swelling.  No injection or focal lesion.  Mouth: lips without lesion/swelling.  Oral mucosa pink and moist.  Dentition intact and without obvious caries or gingival swelling.  Oropharynx without erythema, exudate, or swelling.    IMPRESSION AND PLAN: Tinnitus, right ear, recurrent.   Resolved currently, but when it does recur he needs something to help him sleep hs. Rx'd triazolam---short acting benzo: 0.125 to 0.25mg  qhs prn.  FOLLOW UP: prn

## 2011-05-08 NOTE — Telephone Encounter (Signed)
Ok.  Tell him (again) that I don't want him to have the zolpidem or the lunesta b/c of potential probs with side effects and lasting too long.  I will send a rx for trazodone that will cost him around 12 dollars (I checked with his pharmacy).  If this doesn't help then we'll try something else.  Thanks--PM

## 2011-05-27 ENCOUNTER — Other Ambulatory Visit: Payer: Self-pay | Admitting: Family Medicine

## 2011-05-27 NOTE — Telephone Encounter (Signed)
Please advise refill? 

## 2011-05-28 NOTE — Telephone Encounter (Signed)
OK to refill with same sig, #60 with 1 rf

## 2011-05-30 ENCOUNTER — Other Ambulatory Visit: Payer: Self-pay | Admitting: Family Medicine

## 2011-05-30 NOTE — Telephone Encounter (Signed)
RX refilled on 8/1 by Dr. Abner Greenspan.  Per Shanda Bumps at [facility], they did not receive fax on 8/1.  Verbal order given to [facility].  Pt notified that RX was called to pharm and will be ready today.

## 2011-06-18 ENCOUNTER — Encounter: Payer: Self-pay | Admitting: Family Medicine

## 2011-06-18 ENCOUNTER — Ambulatory Visit (INDEPENDENT_AMBULATORY_CARE_PROVIDER_SITE_OTHER): Payer: Medicare Other | Admitting: Family Medicine

## 2011-06-18 DIAGNOSIS — I714 Abdominal aortic aneurysm, without rupture: Secondary | ICD-10-CM

## 2011-06-18 DIAGNOSIS — R269 Unspecified abnormalities of gait and mobility: Secondary | ICD-10-CM

## 2011-06-18 DIAGNOSIS — E785 Hyperlipidemia, unspecified: Secondary | ICD-10-CM

## 2011-06-18 DIAGNOSIS — G609 Hereditary and idiopathic neuropathy, unspecified: Secondary | ICD-10-CM

## 2011-06-18 DIAGNOSIS — R739 Hyperglycemia, unspecified: Secondary | ICD-10-CM

## 2011-06-18 DIAGNOSIS — R5383 Other fatigue: Secondary | ICD-10-CM

## 2011-06-18 DIAGNOSIS — H9319 Tinnitus, unspecified ear: Secondary | ICD-10-CM

## 2011-06-18 DIAGNOSIS — R7309 Other abnormal glucose: Secondary | ICD-10-CM

## 2011-06-18 DIAGNOSIS — H9311 Tinnitus, right ear: Secondary | ICD-10-CM

## 2011-06-18 MED ORDER — TRIAZOLAM 0.125 MG PO TABS: ORAL_TABLET | ORAL | Status: DC

## 2011-06-18 NOTE — Patient Instructions (Signed)

## 2011-06-23 ENCOUNTER — Other Ambulatory Visit (INDEPENDENT_AMBULATORY_CARE_PROVIDER_SITE_OTHER): Payer: Medicare Other

## 2011-06-23 DIAGNOSIS — R5381 Other malaise: Secondary | ICD-10-CM

## 2011-06-23 DIAGNOSIS — E785 Hyperlipidemia, unspecified: Secondary | ICD-10-CM

## 2011-06-23 DIAGNOSIS — R5383 Other fatigue: Secondary | ICD-10-CM

## 2011-06-23 DIAGNOSIS — R7309 Other abnormal glucose: Secondary | ICD-10-CM

## 2011-06-23 DIAGNOSIS — R739 Hyperglycemia, unspecified: Secondary | ICD-10-CM

## 2011-06-23 LAB — CBC WITH DIFFERENTIAL/PLATELET
Basophils Absolute: 0 10*3/uL (ref 0.0–0.1)
Basophils Relative: 0.5 % (ref 0.0–3.0)
Eosinophils Absolute: 0.1 10*3/uL (ref 0.0–0.7)
Hemoglobin: 14.3 g/dL (ref 13.0–17.0)
Lymphs Abs: 2.6 10*3/uL (ref 0.7–4.0)
MCHC: 33.6 g/dL (ref 30.0–36.0)
MCV: 94.7 fl (ref 78.0–100.0)
Monocytes Absolute: 0.6 10*3/uL (ref 0.1–1.0)
Neutro Abs: 3.2 10*3/uL (ref 1.4–7.7)
RBC: 4.49 Mil/uL (ref 4.22–5.81)
RDW: 14.2 % (ref 11.5–14.6)

## 2011-06-23 LAB — RENAL FUNCTION PANEL
Albumin: 4 g/dL (ref 3.5–5.2)
Calcium: 8.7 mg/dL (ref 8.4–10.5)
Chloride: 101 mEq/L (ref 96–112)
Glucose, Bld: 107 mg/dL — ABNORMAL HIGH (ref 70–99)
Phosphorus: 3.5 mg/dL (ref 2.3–4.6)
Potassium: 4.3 mEq/L (ref 3.5–5.1)
Sodium: 138 mEq/L (ref 135–145)

## 2011-06-23 LAB — HEPATIC FUNCTION PANEL
Alkaline Phosphatase: 63 U/L (ref 39–117)
Bilirubin, Direct: 0 mg/dL (ref 0.0–0.3)
Total Bilirubin: 0.6 mg/dL (ref 0.3–1.2)

## 2011-06-23 LAB — LIPID PANEL: Total CHOL/HDL Ratio: 3

## 2011-06-23 NOTE — Assessment & Plan Note (Signed)
Patient is in today to discuss the ongoing surveillance of his AAA. At his most recent US it was found that  His aneurysm was enlarging and there was some concern that it we irregular so he was referred to Vascular Surgery for evaluation, they obtained further imaging and at least for now have opted to continue active surveillance of the lesion. He agrees to follow this up with vascular surgeon and already has a follow up appt for repeat US at the 6 month mark from the last

## 2011-06-23 NOTE — Progress Notes (Signed)
Shawn Cortez [id number] 1925/04/30 06/23/2011      Progress Note-Follow Up  Subjective  Chief Complaint  Chief Complaint  Patient presents with  . Follow-up    cordinate labs and ultrasound    HPI  Patient is an 77 year old male who is in today accompanied by his wife for followup. On recent ultrasound of his abdominal aorta it was found that his long-standing aneurysm was enlarging. There also was some question of irregularity so he was referred to vascular surgeon. They've an EGD further imaging which confirmed that we may still watch this lesion. No abdominal pain GI or GU complaints. No recent illness, fevers, chills, chest pain, palpitations, shortness of breath, GI or GU complaints. He discontinued she will treat pain, back pain and neuropathic numbness and tingling most notably in his hands but this is unchanged from his baseline. Tendinitis and recently flared with a short course of Halcion he is improved again offers no acute complaints of any tinnitus at this time.  History reviewed. No pertinent past medical history.  Past Surgical History  Procedure Date  . Replacement total knee bilateral   . Thumb tendon severed and repaired     right, tendon snapped  . Carpal tunnel release     Right  . Achilles tendon repair     left, repaired w/ wire  . Lumbar laminetomy   . Brain surgery     done by Dr Peter Garter for tinnitius, unsuccessful, only on right  . Inguinal herniorrhapy years ago    right and then 2 on left    Family History  Problem Relation Age of Onset  . Other Mother     CHF  . Ulcers Mother     Venous stasis  . Other Father     CHF  . Cancer Sister     unknown  . Hip fracture Maternal Grandmother   . Pneumonia Maternal Grandmother     History   Social History  . Marital Status: Married    Spouse Name: N/A    Number of Children: N/A  . Years of Education: N/A   Occupational History  . Not on file.   Social History Main Topics  . Smoking  status: Former Smoker -- 3.0 packs/day    Quit date: 10/28/1959  . Smokeless tobacco: Not on file  . Alcohol Use: 8.4 oz/week    14 Cans of beer per week     1-2 beer daily  . Drug Use: No  . Sexually Active:    Other Topics Concern  . Not on file   Social History Narrative  . No narrative on file    Current Outpatient Prescriptions on File Prior to Visit  Medication Sig Dispense Refill  . clopidogrel (PLAVIX) 75 MG tablet Take 75 mg by mouth at bedtime.        . Coenzyme Q-10 200 MG CAPS Take 200 mg by mouth daily.        . fish oil-omega-3 fatty acids 1000 MG capsule Take 500 mg by mouth daily. 2 daily       . gabapentin (NEURONTIN) 100 MG capsule Take 100 mg by mouth 3 (three) times daily.        . simvastatin (ZOCOR) 40 MG tablet Take 40 mg by mouth at bedtime.        . triazolam (HALCION) 0.125 MG tablet 1-2 tabs po qhs prn tinnitus/ringing in ears  60 tablet  3    No Known Allergies  Review of Systems  Review of Systems  Constitutional: Negative for fever and malaise/fatigue.  HENT: Positive for hearing loss and tinnitus. Negative for congestion.        Right ear improving again  Eyes: Negative for discharge.  Respiratory: Negative for shortness of breath.   Cardiovascular: Negative for chest pain, palpitations and leg swelling.  Gastrointestinal: Negative for nausea, abdominal pain and diarrhea.  Genitourinary: Negative for dysuria.  Musculoskeletal: Negative for falls.  Skin: Negative for rash.  Neurological: Positive for tingling. Negative for sensory change, speech change, seizures, loss of consciousness and headaches.       B/l hands  Endo/Heme/Allergies: Negative for polydipsia.  Psychiatric/Behavioral: Negative for depression and suicidal ideas. The patient is not nervous/anxious and does not have insomnia.      Objective  BP 147/78  Pulse 88  Temp(Src) 97.7 F (36.5 C) (Oral)  Ht 6\' 4"  (1.93 m)  Wt 222 lb 12.8 oz (101.061 kg)  BMI 27.12 kg/m2   SpO2 95%  Physical Exam  Physical Exam  Constitutional: He is oriented to person, place, and time and well-developed, well-nourished, and in no distress. No distress.  HENT:  Head: Normocephalic and atraumatic.  Eyes: Conjunctivae are normal.  Neck: Neck supple. No thyromegaly present.  Cardiovascular: Normal rate and regular rhythm.   Murmur heard.      2/6 sys murmur  Pulmonary/Chest: Effort normal and breath sounds normal. No respiratory distress.  Abdominal: He exhibits no distension and no mass. There is no tenderness.  Musculoskeletal: He exhibits no edema.  Neurological: He is alert and oriented to person, place, and time.  Skin: Skin is warm.  Psychiatric: Memory, affect and judgment normal.    Lab Results  Component Value Date   TSH 1.97 11/28/2010   Lab Results  Component Value Date   WBC 7.4 11/28/2010   HGB 15.2 11/28/2010   HCT 43.3 11/28/2010   MCV 94.3 11/28/2010   PLT 197.0 11/28/2010   Lab Results  Component Value Date   CREATININE 0.8 11/28/2010   BUN 21 11/28/2010   NA 136 11/28/2010   K 4.1 11/28/2010   CL 102 11/28/2010   CO2 26 11/28/2010   Lab Results  Component Value Date   ALT 17 11/28/2010   AST 21 11/28/2010   ALKPHOS 71 11/28/2010   BILITOT 0.8 11/28/2010   Lab Results  Component Value Date   CHOL 141 11/28/2010   Lab Results  Component Value Date   HDL 44.50 11/28/2010   Lab Results  Component Value Date   LDLCALC 69 11/28/2010   Lab Results  Component Value Date   TRIG 140.0 11/28/2010   Lab Results  Component Value Date   CHOLHDL 3 11/28/2010     Assessment & Plan  ABDOMINAL AORTIC ANEURYSM Patient is in today to discuss the ongoing surveillance of his AAA. At his most recent US it was found that  His aneurysm was enlarging and there was some concern that it we irregular so he was referred to Vascular Surgery for evaluation, they obtained further imaging and at least for now have opted to continue active surveillance of the lesion. He agrees to follow  this up with vascular surgeon and already has a follow up appt for repeat US at the 6 month mark from the last  Tinnitus of right ear Had recently worsened and he was given a prescription for Halcion to use when necessary. [facility] when he needed it but symptoms are recently began to improve again we will  continue to monitor and consider referral for further evaluation if symptoms become more troublesome.  PERIPHERAL NEUROPATHY Worst in hands, declines referral at this time does see Dr Sandria Manly intermittently  UNSTEADY GAIT Multifactorial and due to advancing age, spinal stenosis and peripheral neuropathy, no change in therapy  HYPERGLYCEMIA Encouraged to avoid simple carbs and we will check annual labs in the fall and see the patient in follow up as needed

## 2011-06-23 NOTE — Assessment & Plan Note (Signed)
Worst in hands, declines referral at this time does see Dr Sandria Manly intermittently

## 2011-06-23 NOTE — Assessment & Plan Note (Signed)
Encouraged to avoid simple carbs and we will check annual labs in the fall and see the patient in follow up as needed

## 2011-06-23 NOTE — Assessment & Plan Note (Signed)
Had recently worsened and he was given a prescription for Halcion to use when necessary. [facility] when he needed it but symptoms are recently began to improve again we will continue to monitor and consider referral for further evaluation if symptoms become more troublesome.

## 2011-06-23 NOTE — Assessment & Plan Note (Signed)
Multifactorial and due to advancing age, spinal stenosis and peripheral neuropathy, no change in therapy

## 2011-07-21 ENCOUNTER — Telehealth: Payer: Self-pay | Admitting: Family Medicine

## 2011-07-21 LAB — CBC
HCT: 44.6
Hemoglobin: 15.4
Platelets: 206
RBC: 4.83
WBC: 6.5

## 2011-07-21 LAB — COMPREHENSIVE METABOLIC PANEL
Albumin: 3.9
Alkaline Phosphatase: 54
BUN: 15
CO2: 30
Chloride: 102
GFR calc non Af Amer: >60
Glucose, Bld: 100 — ABNORMAL HIGH
Potassium: 3.8
Total Bilirubin: 1.3 — ABNORMAL HIGH

## 2011-07-21 LAB — DIFFERENTIAL
Basophils Absolute: 0
Basophils Relative: 1
Eosinophils Relative: 1
Monocytes Absolute: 0.3
Neutro Abs: 4.3

## 2011-07-21 NOTE — Telephone Encounter (Signed)
Last seen on 06/18/11.  Follow up scheduled on 12/19/11.  Is it OK to refill until that time?  Please advise.  Thanks

## 2011-07-21 NOTE — Telephone Encounter (Signed)
Patient wants  Clopidogrel(Generic Plavix, tablet 75 mg 90 day prescription sent to  [facility] pharmacy fax # [phone].

## 2011-07-21 NOTE — Telephone Encounter (Signed)
OK to refill Clopidegrol 75mg  tab po daily, #90, 1 rf

## 2011-07-22 ENCOUNTER — Encounter: Payer: Self-pay | Admitting: Vascular Surgery

## 2011-07-22 LAB — POCT HEMOGLOBIN-HEMACUE: Hemoglobin: 14.9

## 2011-07-22 MED ORDER — CLOPIDOGREL BISULFATE 75 MG PO TABS: 75.0000 mg | ORAL_TABLET | Freq: Every day | ORAL | Status: DC

## 2011-07-23 ENCOUNTER — Encounter: Payer: Self-pay | Admitting: Vascular Surgery

## 2011-07-23 ENCOUNTER — Encounter (INDEPENDENT_AMBULATORY_CARE_PROVIDER_SITE_OTHER): Payer: Medicare Other | Admitting: *Deleted

## 2011-07-23 ENCOUNTER — Ambulatory Visit (INDEPENDENT_AMBULATORY_CARE_PROVIDER_SITE_OTHER): Payer: Medicare Other | Admitting: Vascular Surgery

## 2011-07-23 VITALS — BP 138/80 | HR 75 | Resp 14 | Ht 76.0 in | Wt 225.0 lb

## 2011-07-23 DIAGNOSIS — I714 Abdominal aortic aneurysm, without rupture, unspecified: Secondary | ICD-10-CM

## 2011-07-23 NOTE — Progress Notes (Signed)
CC: Follow up AAA  HPI: Shawn Cortez is a 77 y.o. male I have been following with an abdominal aortic aneurysm. Since I saw him last 6 months ago, he has had no abdominal or back pain. His been no significant change in his medical history. He does have a history of arthritis which is been stable. He has hyperlipidemia which is stable on his current medications. He's had previous TIA in 2005 but has had no recent history of stroke or TIAs.  Past Medical History  Diagnosis Date  . Arthritis   . SOB (shortness of breath) on exertion   . Hyperlipidemia   . Stroke     TIA history 2005    FAMILY HISTORY: Family History  Problem Relation Age of Onset  . Other Mother     CHF  . Ulcers Mother     Venous stasis  . Other Father     CHF  . Cancer Sister     unknown  . Hip fracture Maternal Grandmother   . Pneumonia Maternal Grandmother     SOCIAL HISTORY: History  Substance Use Topics  . Smoking status: Former Smoker -- 3.0 packs/day    Quit date: 10/28/1959  . Smokeless tobacco: Not on file  . Alcohol Use: 8.4 oz/week    14 Cans of beer per week     1-2 beer daily    Allergies  Allergen Reactions  . Niacin And Related     Facial redness  . Omnipaque (Iohexol)     Facial redness and flushing    Current Outpatient Prescriptions  Medication Sig Dispense Refill  . clopidogrel (PLAVIX) 75 MG tablet Take 1 tablet (75 mg total) by mouth at bedtime.  90 tablet  1  . Coenzyme Q-10 200 MG CAPS Take 200 mg by mouth daily.        . fish oil-omega-3 fatty acids 1000 MG capsule Take 500 mg by mouth daily. 2 daily       . gabapentin (NEURONTIN) 100 MG capsule Take 100 mg by mouth 3 (three) times daily.        . simvastatin (ZOCOR) 40 MG tablet Take 40 mg by mouth at bedtime.        . triazolam (HALCION) 0.125 MG tablet 1-2 tabs po qhs prn tinnitus/ringing in ears  60 tablet  3    REVIEW OF SYSTEMS: CARDIOVASCULAR:  [ ]  chest pain   [ ]  chest pressure   [ ]  palpitations   [ ]   orthopnea   Arly.Keller ] dyspnea on exertion   [ ]  claudication   [ ]  rest pain   [ ]  DVT   [ ]  phlebitis PULMONARY:   [ ]  productive cough   [ ]  asthma   [ ]  wheezing NEUROLOGIC:   [ ]  weakness  [ ]  paresthesias  [ ]  aphasia  [ ]  amaurosis  [ ]  dizziness HEMATOLOGIC:   [ ]  bleeding problems   [ ]  clotting disorders MUSCULOSKELETAL:  [ ]  joint pain   [ ]  joint swelling GASTROINTESTINAL: [ ]   blood in stool  [ ]   hematemesis GENITOURINARY:  [ ]   dysuria  [ ]   hematuria PSYCHIATRIC:  [ ]  history of major depression INTEGUMENTARY:  [ ]  rashes  [ ]  ulcers CONSTITUTIONAL:  [ ]  fever   [ ]  chills  PHYSICAL EXAM: Filed Vitals:   07/23/11 0955  BP: 138/80  Pulse: 75  Resp: 14  Height: 6\' 4"  (1.93 m)  Weight: 225 lb (  102.059 kg)  SpO2: 97%   Body mass index is 27.39 kg/(m^2).  GENERAL: The patient appears their stated age. The vital signs are documented above. CARDIOVASCULAR: There is a regular rate and rhythm without significant murmur appreciated. I do not detect any carotid bruits. He has palpable femoral popliteal and dorsalis pedis pulses bilaterally. PULMONARY: There is good air exchange bilaterally without wheezing or rales. ABDOMEN: Soft and non-tender with normal pitched bowel sounds. His aneurysm is palpable and nontender. MUSCULOSKELETAL: There are no major deformities or cyanosis. NEUROLOGIC: No focal weakness or paresthesias are detected. SKIN: There are no ulcers or rashes noted. PSYCHIATRIC: The patient has a normal affect.  DATA: I have independently interpreted his duplex of his abdominal aortic aneurysm which shows that the maximum diameter of his infrarenal aorta is 4.2 cm. In March by CT scan the aneurysm was 4.5 cm in maximum diameter. Of note on today's study because of his size it was difficult to visualize his common iliac arteries bilaterally.   MEDICAL ISSUES:this patient's abdominal aortic aneurysm appears to be stable in size. He understands that and a normal risk  patient we would consider elective repair at 5.5 cm. Because visualization was somewhat difficult by ultrasound today, in 6 months I have ordered a followup CT scan of the abdomen and pelvis with IV contrast. I'll see him back at that time. He knows to call sooner if he has problems.

## 2011-07-29 ENCOUNTER — Other Ambulatory Visit: Payer: Self-pay | Admitting: Family Medicine

## 2011-07-29 MED ORDER — CLOPIDOGREL BISULFATE 75 MG PO TABS: 75.0000 mg | ORAL_TABLET | Freq: Every day | ORAL | Status: DC

## 2011-07-29 NOTE — Telephone Encounter (Addendum)
[facility] day [facility]. [facility] [address]

## 2011-07-31 NOTE — Procedures (Unsigned)
DUPLEX ULTRASOUND OF ABDOMINAL AORTA  INDICATION:  Abdominal aortic aneurysm.  HISTORY: Diabetes:  No. Cardiac:  No. Hypertension:  No. Smoking:  Previous. Connective Tissue Disorder: Family History:  No. Previous Surgery:  No.  DUPLEX EXAM:         AP (cm)                   TRANSVERSE (cm) Proximal             2.8 cm                    2.9 cm Mid                  Not visualized            Not visualized Distal               4.2 cm                    4.2 cm Right Iliac          Not visualized            Not visualized Left Iliac           Not visualized            Not visualized  PREVIOUS:  Date: 01/01/2011  AP:  4.5  TRANSVERSE:  4.4  IMPRESSION: 1. Aneurysmal dilatation of the distal abdominal aorta with no     significant change when compared to the previous examination done     at [facility]. 2. Unable to adequately visualize the mid abdominal aorta and     bilateral common iliac arteries due to overlying bowel gas     patterns.  ___________________________________________ Di Kindle. Edilia Bo, M.D.  CH/MEDQ  D:  07/28/2011  T:  07/28/2011  Job:  [id number]

## 2011-08-06 ENCOUNTER — Ambulatory Visit: Payer: Medicare Other

## 2011-08-20 ENCOUNTER — Telehealth: Payer: Self-pay | Admitting: Family Medicine

## 2011-08-20 NOTE — Telephone Encounter (Signed)
Patient needs to know whether he got his flu shot

## 2011-08-20 NOTE — Telephone Encounter (Signed)
Informed patients wife that pt had not had his flu shot.

## 2011-10-04 ENCOUNTER — Other Ambulatory Visit: Payer: Self-pay | Admitting: Family Medicine

## 2011-11-10 ENCOUNTER — Ambulatory Visit (INDEPENDENT_AMBULATORY_CARE_PROVIDER_SITE_OTHER): Payer: Medicare Other | Admitting: Family Medicine

## 2011-11-10 ENCOUNTER — Encounter: Payer: Self-pay | Admitting: Family Medicine

## 2011-11-10 DIAGNOSIS — H9311 Tinnitus, right ear: Secondary | ICD-10-CM

## 2011-11-10 DIAGNOSIS — G609 Hereditary and idiopathic neuropathy, unspecified: Secondary | ICD-10-CM

## 2011-11-10 DIAGNOSIS — H9319 Tinnitus, unspecified ear: Secondary | ICD-10-CM

## 2011-11-10 MED ORDER — TRIAZOLAM 0.125 MG PO TABS: 0.1250 mg | ORAL_TABLET | Freq: Two times a day (BID) | ORAL | Status: DC | PRN

## 2011-11-10 NOTE — Progress Notes (Signed)
Patient ID: Shawn Cortez, male   DOB: 01/27/1925, 78 y.o.   MRN: [id number] Shawn Cortez [id number] 12/18/1924 11/10/2011      Progress Note-Follow Up  Subjective  Chief Complaint  Chief Complaint  Patient presents with  . consulation per wife    HPI  Patient is a 78 year old male who is in today for evaluation of multiple medical problems. He does note he had a complete checkup at home with [facility] November and increased fasting labs prior to next clinic. He said no recent illness, fevers, chills, palpitations, shortness of breath, GI symptoms  Past Medical History  Diagnosis Date  . Arthritis   . SOB (shortness of breath) on exertion   . Hyperlipidemia   . Stroke     TIA history 2005    Past Surgical History  Procedure Date  . Thumb tendon severed and repaired     right, tendon snapped  . Carpal tunnel release     Right  . Achilles tendon repair     left, repaired w/ wire  . Brain surgery     done by Dr Peter Garter for tinnitius, unsuccessful, only on right  . Inguinal herniorrhapy years ago    right and then 2 on left  . Spine surgery     Lumbar laminectomy  . Joint replacement     Bilateral total knee replacement    Family History  Problem Relation Age of Onset  . Other Mother     CHF  . Ulcers Mother     Venous stasis  . Other Father     CHF  . Cancer Sister     unknown  . Hip fracture Maternal Grandmother   . Pneumonia Maternal Grandmother     History   Social History  . Marital Status: Married    Spouse Name: N/A    Number of Children: N/A  . Years of Education: N/A   Occupational History  . Not on file.   Social History Main Topics  . Smoking status: Former Smoker -- 3.0 packs/day    Quit date: 10/28/1959  . Smokeless tobacco: Not on file  . Alcohol Use: 8.4 oz/week    14 Cans of beer per week     1-2 beer daily  . Drug Use: No  . Sexually Active:    Other Topics Concern  . Not on file   Social History Narrative  . No narrative  on file    Current Outpatient Prescriptions on File Prior to Visit  Medication Sig Dispense Refill  . clopidogrel (PLAVIX) 75 MG tablet Take 1 tablet (75 mg total) by mouth at bedtime.  30 tablet  1  . Coenzyme Q-10 200 MG CAPS Take 200 mg by mouth daily.        . fish oil-omega-3 fatty acids 1000 MG capsule Take 500 mg by mouth daily. 2 daily       . gabapentin (NEURONTIN) 100 MG capsule Take 100 mg by mouth 3 (three) times daily.        . simvastatin (ZOCOR) 40 MG tablet Take 40 mg by mouth at bedtime.        Marland Kitchen DISCONTD: triazolam (HALCION) 0.125 MG tablet TAKE ONE TO TWO TABLETS BY MOUTH AT BEDTIME AS NEEDED RINGING IN THE EAR.  60 tablet  0    Allergies  Allergen Reactions  . Niacin And Related     Facial redness  . Omnipaque (Iohexol)     Facial redness and flushing  Review of Systems  Review of Systems  Constitutional: Negative for fever and malaise/fatigue.  HENT: Negative for congestion.   Eyes: Negative for discharge.  Respiratory: Negative for shortness of breath.   Cardiovascular: Negative for chest pain, palpitations and leg swelling.  Gastrointestinal: Negative for nausea, abdominal pain and diarrhea.  Genitourinary: Negative for dysuria.  Musculoskeletal: Negative for falls.  Skin: Negative for rash.  Neurological: Negative for loss of consciousness and headaches.  Endo/Heme/Allergies: Negative for polydipsia.  Psychiatric/Behavioral: Negative for depression and suicidal ideas. The patient is not nervous/anxious and does not have insomnia.     Objective  BP 108/68  Pulse 83  Temp(Src) 97.6 F (36.4 C) (Temporal)  Ht 6\' 4"  (1.93 m)  Wt 225 lb 12.8 oz (102.422 kg)  BMI 27.49 kg/m2  SpO2 95%  Physical Exam  Physical Exam  Constitutional: He is oriented to person, place, and time and well-developed, well-nourished, and in no distress. No distress.  HENT:  Head: Normocephalic and atraumatic.  Eyes: Conjunctivae are normal.  Neck: Neck supple. No  thyromegaly present.  Cardiovascular: Normal rate, regular rhythm and normal heart sounds.   No murmur heard. Pulmonary/Chest: Effort normal and breath sounds normal. No respiratory distress.  Abdominal: He exhibits no distension and no mass. There is no tenderness.  Musculoskeletal: He exhibits no edema.  Neurological: He is alert and oriented to person, place, and time.  Skin: Skin is warm.  Psychiatric: Memory, affect and judgment normal.    Lab Results  Component Value Date   TSH 0.68 06/23/2011   Lab Results  Component Value Date   WBC 6.6 06/23/2011   HGB 14.3 06/23/2011   HCT 42.6 06/23/2011   MCV 94.7 06/23/2011   PLT 209.0 06/23/2011   Lab Results  Component Value Date   CREATININE 0.9 06/23/2011   BUN 21 06/23/2011   NA 138 06/23/2011   K 4.3 06/23/2011   CL 101 06/23/2011   CO2 27 06/23/2011   Lab Results  Component Value Date   ALT 14 06/23/2011   AST 20 06/23/2011   ALKPHOS 63 06/23/2011   BILITOT 0.6 06/23/2011   Lab Results  Component Value Date   CHOL 133 06/23/2011   Lab Results  Component Value Date   HDL 52.30 06/23/2011   Lab Results  Component Value Date   LDLCALC 56 06/23/2011   Lab Results  Component Value Date   TRIG 124.0 06/23/2011   Lab Results  Component Value Date   CHOLHDL 3 06/23/2011     Assessment & Plan

## 2011-11-10 NOTE — Assessment & Plan Note (Signed)
Has had a good response to low dose halcion will give rx today

## 2011-11-10 NOTE — Assessment & Plan Note (Signed)
B/l feet with burning tingling dating back over the years patient has many old work mates to work

## 2011-11-10 NOTE — Patient Instructions (Signed)
Peripheral Neuropathy Peripheral neuropathy is a common disorder of your nerves resulting from damage. CAUSES  This disorder may be caused by a disease of the nerves or illness. Many neuropathies have well known causes such as:  Diabetes. This is one of the most common causes.   Uremia.   AIDS.   Nutritional deficiencies.   Other causes include mechanical pressures. These may be from:   Compression.   Injury.   Contusions or bruises.   Fracture or dislocated bones.   Pressure involving the nerves close to the surface. Nerves such as the ulnar, or radial can be injured by prolonged use of crutches.  Other injuries may come from:  Tumor.   Hemorrhage or bleeding into a nerve.   Exposure to cold or radiation.   Certain medicines or toxic substances (rare).   Vascular or collagen disorders such as:   Atherosclerosis.   Systemic lupus erythematosus.   Scleroderma.   Sarcoidosis.   Rheumatoid arthritis.   Polyarteritis nodosa.   A large number of cases are of unknown cause.  SYMPTOMS  Common problems include:  Weakness.   Numbness.   Abnormal sensations (paresthesia) such as:   Burning.   Tickling.   Pricking.   Tingling.   Pain in the arms, hands, legs and/or feet.  TREATMENT  Therapy for this disorder differs depending on the cause. It may vary from medical treatment with medications or physical therapy among others.   For example, therapy for this disorder caused by diabetes involves control of the diabetes.   In cases where a tumor or ruptured disc is the cause, therapy may involve surgery. This would be to remove the tumor or to repair the ruptured disc.   In entrapment or compression neuropathy, treatment may consist of splinting or surgical decompression of the ulnar or median nerves. A common example of entrapment neuropathy is carpal tunnel syndrome. This has become more common because of the increasing use of computers.   Peroneal and  radial compression neuropathies may require avoidance of pressure.   Physical therapy and/or splints may be useful in preventing contractures. This is a condition in which shortened muscles around joints cause abnormal and sometimes painful positioning of the joints.  Document Released: 10/03/2002 Document Revised: 06/25/2011 Document Reviewed: 10/13/2005 [employer] Patient Information 2012 [employer]. 

## 2011-11-11 ENCOUNTER — Telehealth: Payer: Self-pay | Admitting: Family Medicine

## 2011-11-11 MED ORDER — CLOPIDOGREL BISULFATE 75 MG PO TABS: 75.0000 mg | ORAL_TABLET | Freq: Every day | ORAL | Status: DC

## 2011-11-11 MED ORDER — SIMVASTATIN 40 MG PO TABS: 40.0000 mg | ORAL_TABLET | Freq: Every day | ORAL | Status: DC

## 2011-11-11 NOTE — Telephone Encounter (Signed)
Pls send Rx  For Simvastatin & Clopidogrel to [facility] fax [phone], patient is not sure if this needs to be for 30 or 90 days?

## 2011-11-12 ENCOUNTER — Encounter: Payer: Self-pay | Admitting: Neurology

## 2011-11-24 ENCOUNTER — Telehealth: Payer: Self-pay | Admitting: Family Medicine

## 2011-11-24 MED ORDER — CLOPIDOGREL BISULFATE 75 MG PO TABS: 75.0000 mg | ORAL_TABLET | Freq: Every day | ORAL | Status: DC

## 2011-11-24 MED ORDER — SIMVASTATIN 40 MG PO TABS: 40.0000 mg | ORAL_TABLET | Freq: Every day | ORAL | Status: DC

## 2011-11-24 NOTE — Telephone Encounter (Signed)
Patient requesting Clopidogrel & Simvastatin 90 days sent to [facility], patient said that the mail order pharmacy did not fill his Rx due to having the wrong CC on file and didn't contact him & he doesn't want to deal with them any more

## 2011-12-12 ENCOUNTER — Other Ambulatory Visit: Payer: Medicare Other

## 2011-12-17 ENCOUNTER — Ambulatory Visit: Payer: Medicare Other | Admitting: Neurology

## 2011-12-18 ENCOUNTER — Ambulatory Visit (INDEPENDENT_AMBULATORY_CARE_PROVIDER_SITE_OTHER): Payer: Medicare Other | Admitting: Neurology

## 2011-12-18 ENCOUNTER — Encounter: Payer: Self-pay | Admitting: Neurology

## 2011-12-18 DIAGNOSIS — R7309 Other abnormal glucose: Secondary | ICD-10-CM

## 2011-12-18 DIAGNOSIS — G609 Hereditary and idiopathic neuropathy, unspecified: Secondary | ICD-10-CM

## 2011-12-18 MED ORDER — GABAPENTIN 300 MG PO CAPS: 300.0000 mg | ORAL_CAPSULE | Freq: Two times a day (BID) | ORAL | Status: DC

## 2011-12-18 NOTE — Patient Instructions (Addendum)
Your labs will be collected next week when you go to Dr. Mariel Aloe office.

## 2011-12-18 NOTE — Progress Notes (Signed)
Dear Dr. Abner Greenspan,  Thank you for having me see Shawn Cortez in consultation today at [facility] for his problem with ?peripheral neuropathy.  As you may recall, he is a 78 y.o. year old male with a history of lumbar stenosis s/p multilevel laminectomy ?L1-L5 who presents with progresive tingling that started in his right hand over the last several years and then moved to his left.  He feels the dexterity is not the same in his right hand to the extent he has had to start using the computer mouse with his left hand.  He has developed very similar sensations in his left hand.  He had a carpal tunnel release done which did not help his right hand.  He describes the sensation as being in the thumb, forefinger and middle finger.  He has been seen by Dr. Sandria Manly, and it is unclear what his diagnosis was, although it sounds like Dr. Sandria Manly felt this was due to a radiculopathy.  No EMG/NCS has been done for this problem(although did have one done for his lumbar stenosis which found a neuropathy).  We also don't have any records of MRI C-spine imaging.  The patient has been started on gabapentin but is taking it irregularly and takes between 2-4 tablets once per day.  He has a long history of gait instability, that apparently happened secondary to his lumbar stenosis.   Past Medical History  Diagnosis Date  . Arthritis   . SOB (shortness of breath) on exertion   . Hyperlipidemia   . Stroke     TIA history 2005    Past Surgical History  Procedure Date  . Thumb tendon severed and repaired     right, tendon snapped  . Carpal tunnel release     Right  . Achilles tendon repair     left, repaired w/ wire  . Brain surgery     done by Dr Peter Garter for tinnitius, unsuccessful, only on right  . Inguinal herniorrhapy years ago    right and then 2 on left  . Spine surgery     Lumbar laminectomy  . Joint replacement     Bilateral total knee replacement    History   Social History  . Marital Status:  Married    Spouse Name: N/A    Number of Children: N/A  . Years of Education: N/A   Social History Main Topics  . Smoking status: Former Smoker -- 3.0 packs/day    Quit date: 10/16/1960  . Smokeless tobacco: Never Used  . Alcohol Use: 8.4 oz/week    14 Cans of beer per week     1-2 beer daily  . Drug Use: No  . Sexually Active: None   Other Topics Concern  . None   Social History Narrative  . None    Family History  Problem Relation Age of Onset  . Other Mother     CHF  . Ulcers Mother     Venous stasis  . Other Father     CHF  . Cancer Sister     unknown  . Hip fracture Maternal Grandmother   . Pneumonia Maternal Grandmother     Current Outpatient Prescriptions on File Prior to Visit  Medication Sig Dispense Refill  . clopidogrel (PLAVIX) 75 MG tablet Take 1 tablet (75 mg total) by mouth at bedtime.  90 tablet  1  . Coenzyme Q-10 200 MG CAPS Take 200 mg by mouth daily.        Marland Kitchen  fish oil-omega-3 fatty acids 1000 MG capsule Take 500 mg by mouth daily. 2 daily       . simvastatin (ZOCOR) 40 MG tablet Take 1 tablet (40 mg total) by mouth at bedtime.  90 tablet  1  . triazolam (HALCION) 0.125 MG tablet Take 1 tablet (0.125 mg total) by mouth 2 (two) times daily as needed.  60 tablet  2    Allergies  Allergen Reactions  . Niacin And Related     Facial redness  . Omnipaque (Iohexol)     Facial redness and flushing      ROS:  13 systems were reviewed and are notable for severe gait instability, paucity of tingling in feet.  He does get tinnitus and thinks this is related to neck position.  His tingling may get worse with movement of the neck.  He has been told he has arthritis in his neck.  He has severe immobility of his neck.  All other review of systems are unremarkable.   Examination:  Filed Vitals:   12/18/11 1421  BP: 142/80  Pulse: 104  Height: 6\' 4"  (1.93 m)  Weight: 224 lb (101.606 kg)     In general, well appearing older  man.  Cardiovascular: The patient has a regular rate and rhythm.  Fundoscopy:  Limited by pupillary constriction.  Mental status:   The patient is oriented to person, place and time. Recent and remote memory are intact. Attention span and concentration are normal. Language including repetition, naming, following commands are intact. Fund of knowledge of current and historical events, as well as vocabulary are normal.  Cranial Nerves: Pupils are equally round and reactive to light. Visual fields full to confrontation. Extraocular movements are intact without nystagmus. Facial sensation and muscles of mastication are intact. Muscles of facial expression are symmetric. Hearing decreased to finger run, particularly on the right. Tongue protrusion, uvula, palate midline.  Shoulder shrug intact  Motor:  The patient has normal decreased bulk in his right thenar muscles, no pronator drift.  There are no adventitious movements.  5/5 muscle strength bilaterally.  Reflexes:   Biceps Brachioradialis Triceps Patella Ankles Babinski  Right 2+ 1+ 2+ 2+ 0 mute  Left 2+ 2+ 1+ 0 0 mute    Coordination:  Normal finger to nose.  No dysdiadokinesia.  Sensation is decreased to vibration and temperature in feet, but not clearly length dependent.  In hands there may be a dermatomal pattern of loss and again not clear length dep, but perhaps C6 more involved at least in right hand.  Gait and Station are wide based unstable.    Impression/Recs:  1.  Bilateral hand tingling - I think this is due to a C6 radiculopathy.  Patient would like to hold off on EMG for now.  He is going to increase his gabapentin to 300 bid.  If this does not help we can increase further.   I will send off PN labs and get Dr. Imagene Gurney notes. 2.  Gait instability - I think this is from his history of lumbar stenosis.  However, he could have an overlying PN.  If we get an EMG/NCS we may be able to discern the difference or whether both  are at play here.   We will see the patient back in 6 weeks.  Thank you for having [place] see Shawn Cortez in consultation.  Feel free to contact me with any questions.  Lupita Raider Modesto Charon, MD [facility], [place] [address] [place],  [place] [address] Phone: [phone] Fax: [phone].

## 2011-12-19 ENCOUNTER — Ambulatory Visit: Payer: Medicare Other | Admitting: Family Medicine

## 2011-12-26 ENCOUNTER — Other Ambulatory Visit: Payer: Self-pay | Admitting: Neurology

## 2011-12-26 ENCOUNTER — Other Ambulatory Visit (INDEPENDENT_AMBULATORY_CARE_PROVIDER_SITE_OTHER): Payer: Medicare Other

## 2011-12-26 DIAGNOSIS — E785 Hyperlipidemia, unspecified: Secondary | ICD-10-CM

## 2011-12-26 DIAGNOSIS — R7309 Other abnormal glucose: Secondary | ICD-10-CM

## 2011-12-26 DIAGNOSIS — G609 Hereditary and idiopathic neuropathy, unspecified: Secondary | ICD-10-CM

## 2011-12-26 LAB — CBC WITH DIFFERENTIAL/PLATELET
Basophils Absolute: 0 10*3/uL (ref 0.0–0.1)
Basophils Relative: 1 % (ref 0–1)
Eosinophils Absolute: 0.1 10*3/uL (ref 0.0–0.7)
Eosinophils Relative: 2 % (ref 0–5)
HCT: 43.5 % (ref 39.0–52.0)
Hemoglobin: 14.2 g/dL (ref 13.0–17.0)
MCH: 30 pg (ref 26.0–34.0)
MCHC: 32.6 g/dL (ref 30.0–36.0)
Monocytes Absolute: 0.4 10*3/uL (ref 0.1–1.0)
Monocytes Relative: 7 % (ref 3–12)
RDW: 13.9 % (ref 11.5–15.5)

## 2011-12-26 LAB — LIPID PANEL
Cholesterol: 131 mg/dL (ref 0–200)
HDL: 47.2 mg/dL (ref >39.00)
LDL Cholesterol: 64 mg/dL (ref 0–99)
Triglycerides: 100 mg/dL (ref 0.0–149.0)
VLDL: 20 mg/dL (ref 0.0–40.0)

## 2011-12-26 LAB — HEPATIC FUNCTION PANEL
ALT: 17 U/L (ref 0–53)
AST: 24 U/L (ref 0–37)
Albumin: 3.8 g/dL (ref 3.5–5.2)
Alkaline Phosphatase: 74 U/L (ref 39–117)

## 2011-12-26 LAB — RENAL FUNCTION PANEL
Albumin: 3.8 g/dL (ref 3.5–5.2)
BUN: 19 mg/dL (ref 6–23)
CO2: 26 mEq/L (ref 19–32)
Calcium: 8.8 mg/dL (ref 8.4–10.5)
Chloride: 101 mEq/L (ref 96–112)
Creatinine, Ser: 1 mg/dL (ref 0.4–1.5)
Glucose, Bld: 97 mg/dL (ref 70–99)
Phosphorus: 3.2 mg/dL (ref 2.3–4.6)
Potassium: 4 mEq/L (ref 3.5–5.1)
Sodium: 136 mEq/L (ref 135–145)

## 2011-12-27 LAB — HEMOGLOBIN A1C
Hgb A1c MFr Bld: 6.1 % — ABNORMAL HIGH (ref <5.7)
Mean Plasma Glucose: 128 mg/dL — ABNORMAL HIGH (ref <117)

## 2011-12-27 LAB — COMPREHENSIVE METABOLIC PANEL
Albumin: 3.8 g/dL (ref 3.5–5.2)
Alkaline Phosphatase: 81 U/L (ref 39–117)
BUN: 17 mg/dL (ref 6–23)
Creat: 0.98 mg/dL (ref 0.50–1.35)
Glucose, Bld: 92 mg/dL (ref 70–99)
Total Bilirubin: 0.8 mg/dL (ref 0.3–1.2)

## 2011-12-27 LAB — VITAMIN B12: Vitamin B-12: 441 pg/mL (ref 211–911)

## 2011-12-27 LAB — INTRINSIC FACTOR ANTIBODIES: Intrinsic Factor: NEGATIVE

## 2011-12-29 LAB — FOLATE: Folate: >24.8 ng/mL (ref >5.9)

## 2011-12-30 LAB — METHYLMALONIC ACID, SERUM: Methylmalonic Acid, Quant: 0.2 umol/L (ref <0.40)

## 2011-12-30 LAB — VITAMIN B1: Vitamin B1 (Thiamine): 21 nmol/L (ref 8–30)

## 2011-12-31 LAB — SPEP & IFE WITH QIG
Albumin ELP: 56.6 % (ref 55.8–66.1)
Alpha-1-Globulin: 4.3 % (ref 2.9–4.9)
Alpha-2-Globulin: 11.1 % (ref 7.1–11.8)
Beta 2: 5.2 % (ref 3.2–6.5)
Beta Globulin: 5.8 % (ref 4.7–7.2)
Gamma Globulin: 17 % (ref 11.1–18.8)

## 2012-01-02 ENCOUNTER — Encounter: Payer: Self-pay | Admitting: Family Medicine

## 2012-01-02 ENCOUNTER — Ambulatory Visit (INDEPENDENT_AMBULATORY_CARE_PROVIDER_SITE_OTHER): Payer: Medicare Other | Admitting: Family Medicine

## 2012-01-02 DIAGNOSIS — R7309 Other abnormal glucose: Secondary | ICD-10-CM

## 2012-01-02 DIAGNOSIS — R739 Hyperglycemia, unspecified: Secondary | ICD-10-CM

## 2012-01-02 DIAGNOSIS — M48061 Spinal stenosis, lumbar region without neurogenic claudication: Secondary | ICD-10-CM

## 2012-01-02 DIAGNOSIS — I714 Abdominal aortic aneurysm, without rupture: Secondary | ICD-10-CM

## 2012-01-02 DIAGNOSIS — G609 Hereditary and idiopathic neuropathy, unspecified: Secondary | ICD-10-CM

## 2012-01-02 NOTE — Patient Instructions (Signed)
Peripheral Neuropathy Peripheral neuropathy is a common disorder of your nerves resulting from damage. CAUSES  This disorder may be caused by a disease of the nerves or illness. Many neuropathies have well known causes such as:  Diabetes. This is one of the most common causes.   Uremia.   AIDS.   Nutritional deficiencies.   Other causes include mechanical pressures. These may be from:   Compression.   Injury.   Contusions or bruises.   Fracture or dislocated bones.   Pressure involving the nerves close to the surface. Nerves such as the ulnar, or radial can be injured by prolonged use of crutches.  Other injuries may come from:  Tumor.   Hemorrhage or bleeding into a nerve.   Exposure to cold or radiation.   Certain medicines or toxic substances (rare).   Vascular or collagen disorders such as:   Atherosclerosis.   Systemic lupus erythematosus.   Scleroderma.   Sarcoidosis.   Rheumatoid arthritis.   Polyarteritis nodosa.   A large number of cases are of unknown cause.  SYMPTOMS  Common problems include:  Weakness.   Numbness.   Abnormal sensations (paresthesia) such as:   Burning.   Tickling.   Pricking.   Tingling.   Pain in the arms, hands, legs and/or feet.  TREATMENT  Therapy for this disorder differs depending on the cause. It may vary from medical treatment with medications or physical therapy among others.   For example, therapy for this disorder caused by diabetes involves control of the diabetes.   In cases where a tumor or ruptured disc is the cause, therapy may involve surgery. This would be to remove the tumor or to repair the ruptured disc.   In entrapment or compression neuropathy, treatment may consist of splinting or surgical decompression of the ulnar or median nerves. A common example of entrapment neuropathy is carpal tunnel syndrome. This has become more common because of the increasing use of computers.   Peroneal and  radial compression neuropathies may require avoidance of pressure.   Physical therapy and/or splints may be useful in preventing contractures. This is a condition in which shortened muscles around joints cause abnormal and sometimes painful positioning of the joints.  Document Released: 10/03/2002 Document Revised: 06/25/2011 Document Reviewed: 10/13/2005 [employer] Patient Information 2012 [employer]. 

## 2012-01-04 ENCOUNTER — Encounter: Payer: Self-pay | Admitting: Family Medicine

## 2012-01-04 DIAGNOSIS — R739 Hyperglycemia, unspecified: Secondary | ICD-10-CM

## 2012-01-04 HISTORY — DX: Hyperglycemia, unspecified: R73.9

## 2012-01-04 NOTE — Assessment & Plan Note (Signed)
hgba1c of 6.1. Patient encouraged to minimize carbs and only use complex carbs, minimize sweets and we will repeat hgba1c in 3 months

## 2012-01-04 NOTE — Assessment & Plan Note (Signed)
Has been left some residual neuropathy

## 2012-01-04 NOTE — Assessment & Plan Note (Signed)
Has an MRI of Aorta scheduled soon and is following with vascular surgeon.

## 2012-01-04 NOTE — Progress Notes (Signed)
Patient ID: Shawn Cortez, male   DOB: [birth year], 78 y.o.   MRN: [id number] Shawn Cortez [id number] 1925-06-02 01/04/2012      Progress Note-Follow Up  Subjective  Chief Complaint  Chief Complaint  Patient presents with  . Follow-up    6 month follow up    HPI  Patient is a 78 year old caucasian male in today for routine follow up. He feels well today. No recent illness, fevers, chills, cp, palp, sob, gi or gu c/o. He has an appt soon for an MRI of his aorta but is not experiencing any abdominal pain, syncope or other concerning symptoms.He continues to struggle with numbness and discomfort in his legs. He is following with neurology at the present time  Past Medical History  Diagnosis Date  . Arthritis   . SOB (shortness of breath) on exertion   . Hyperlipidemia   . Stroke     TIA history 2005  . Hyperglycemia 01/04/2012    Past Surgical History  Procedure Date  . Thumb tendon severed and repaired     right, tendon snapped  . Carpal tunnel release     Right  . Achilles tendon repair     left, repaired w/ wire  . Brain surgery     done by Dr Peter Garter for tinnitius, unsuccessful, only on right  . Inguinal herniorrhapy years ago    right and then 2 on left  . Spine surgery     Lumbar laminectomy  . Joint replacement     Bilateral total knee replacement    Family History  Problem Relation Age of Onset  . Other Mother     CHF  . Ulcers Mother     Venous stasis  . Other Father     CHF  . Cancer Sister     unknown  . Hip fracture Maternal Grandmother   . Pneumonia Maternal Grandmother     History   Social History  . Marital Status: Married    Spouse Name: N/A    Number of Children: N/A  . Years of Education: N/A   Occupational History  . Not on file.   Social History Main Topics  . Smoking status: Former Smoker -- 3.0 packs/day    Quit date: 10/16/1960  . Smokeless tobacco: Never Used  . Alcohol Use: 8.4 oz/week    14 Cans of beer per week    1-2 beer daily  . Drug Use: No  . Sexually Active: Not on file   Other Topics Concern  . Not on file   Social History Narrative  . No narrative on file    Current Outpatient Prescriptions on File Prior to Visit  Medication Sig Dispense Refill  . clopidogrel (PLAVIX) 75 MG tablet Take 1 tablet (75 mg total) by mouth at bedtime.  90 tablet  1  . fish oil-omega-3 fatty acids 1000 MG capsule Take 500 mg by mouth daily. 2 daily       . gabapentin (NEURONTIN) 300 MG capsule Take 1 capsule (300 mg total) by mouth 2 (two) times daily.  60 capsule  3  . simvastatin (ZOCOR) 40 MG tablet Take 1 tablet (40 mg total) by mouth at bedtime.  90 tablet  1  . triazolam (HALCION) 0.125 MG tablet Take 1 tablet (0.125 mg total) by mouth 2 (two) times daily as needed.  60 tablet  2  . Coenzyme Q-10 200 MG CAPS Take 200 mg by mouth daily.  Allergies  Allergen Reactions  . Niacin And Related     Facial redness  . Omnipaque (Iohexol)     Facial redness and flushing    Review of Systems  Review of Systems  Constitutional: Negative for fever and malaise/fatigue.  HENT: Negative for congestion.   Eyes: Negative for discharge.  Respiratory: Negative for shortness of breath.   Cardiovascular: Negative for chest pain, palpitations and leg swelling.  Gastrointestinal: Negative for nausea, abdominal pain and diarrhea.  Genitourinary: Negative for dysuria.  Musculoskeletal: Negative for falls.  Skin: Negative for rash.  Neurological: Positive for sensory change. Negative for loss of consciousness and headaches.  Endo/Heme/Allergies: Negative for polydipsia.  Psychiatric/Behavioral: Negative for depression and suicidal ideas. The patient is not nervous/anxious and does not have insomnia.     Objective  BP 111/65  Pulse 80  Temp(Src) 99 F (37.2 C) (Temporal)  Ht 6\' 4"  (1.93 m)  Wt 226 lb 12.8 oz (102.876 kg)  BMI 27.61 kg/m2  SpO2 97%  Physical Exam  Physical Exam  Constitutional:  He is oriented to person, place, and time and well-developed, well-nourished, and in no distress. No distress.  HENT:  Head: Normocephalic and atraumatic.  Eyes: Conjunctivae are normal.  Neck: Neck supple. No thyromegaly present.  Cardiovascular: Normal rate and regular rhythm.   Murmur heard.      1/6 murmur  Pulmonary/Chest: Effort normal and breath sounds normal. No respiratory distress.  Abdominal: He exhibits no distension and no mass. There is no tenderness.  Musculoskeletal: He exhibits no edema.  Neurological: He is alert and oriented to person, place, and time.  Skin: Skin is warm.  Psychiatric: Memory, affect and judgment normal.    Lab Results  Component Value Date   TSH 2.532 12/26/2011   Lab Results  Component Value Date   WBC 6.0 12/26/2011   HGB 14.2 12/26/2011   HCT 43.5 12/26/2011   MCV 92.0 12/26/2011   PLT 220 12/26/2011   Lab Results  Component Value Date   CREATININE 1.0 12/26/2011   BUN 19 12/26/2011   NA 136 12/26/2011   K 4.0 12/26/2011   CL 101 12/26/2011   CO2 26 12/26/2011   Lab Results  Component Value Date   ALT 17 12/26/2011   AST 24 12/26/2011   ALKPHOS 74 12/26/2011   BILITOT 0.7 12/26/2011   Lab Results  Component Value Date   CHOL 131 12/26/2011   Lab Results  Component Value Date   HDL 47.20 12/26/2011   Lab Results  Component Value Date   LDLCALC 64 12/26/2011   Lab Results  Component Value Date   TRIG 100.0 12/26/2011   Lab Results  Component Value Date   CHOLHDL 3 12/26/2011     Assessment & Plan  Hyperglycemia hgba1c of 6.1. Patient encouraged to minimize carbs and only use complex carbs, minimize sweets and we will repeat hgba1c in 3 months  ABDOMINAL AORTIC ANEURYSM Has an MRI of Aorta scheduled soon and is following with vascular surgeon.  SPINAL STENOSIS, LUMBAR Has been left some residual neuropathy   PERIPHERAL NEUROPATHY Thus far his work up with neurology has not uncovered any cause of his lower extremity symptoms that are amenable  to easy treatment

## 2012-01-04 NOTE — Assessment & Plan Note (Signed)
Thus far his work up with neurology has not uncovered any cause of his lower extremity symptoms that are amenable to easy treatment

## 2012-01-16 ENCOUNTER — Other Ambulatory Visit: Payer: Self-pay | Admitting: Vascular Surgery

## 2012-01-16 LAB — CREATININE, SERUM: Creat: 1.12 mg/dL (ref 0.50–1.35)

## 2012-01-20 ENCOUNTER — Encounter: Payer: Self-pay | Admitting: Vascular Surgery

## 2012-01-21 ENCOUNTER — Ambulatory Visit (INDEPENDENT_AMBULATORY_CARE_PROVIDER_SITE_OTHER): Payer: Medicare Other | Admitting: Vascular Surgery

## 2012-01-21 ENCOUNTER — Ambulatory Visit
Admission: RE | Admit: 2012-01-21 | Discharge: 2012-01-21 | Disposition: A | Discharge status: Home / Self Care | Payer: Medicare Other | Source: Ambulatory Visit | Attending: Vascular Surgery | Admitting: Vascular Surgery

## 2012-01-21 ENCOUNTER — Encounter: Payer: Self-pay | Admitting: Vascular Surgery

## 2012-01-21 VITALS — BP 127/69 | HR 107 | Temp 97.4°F | Resp 18 | Ht 76.0 in | Wt 223.0 lb

## 2012-01-21 DIAGNOSIS — I714 Abdominal aortic aneurysm, without rupture, unspecified: Secondary | ICD-10-CM

## 2012-01-21 MED ORDER — IOHEXOL 300 MG/ML  SOLN
100.0000 mL | Freq: Once | INTRAMUSCULAR | Status: AC | PRN
  Administered 2012-01-21: 100 mL via INTRAVENOUS

## 2012-01-21 NOTE — Progress Notes (Signed)
[facility]  Patient name: Shawn Cortez MRN: [id number] DOB: 1925/06/06 Sex: male  REASON FOR VISIT: follow up of abdominal aortic aneurysm  HPI: Shawn Cortez is a 78 y.o. male who I last saw in September of 2012 with a 4.5 cm abdominal aortic aneurysm. He comes in with a 6 month follow up CT scan. He has not had any new onset abdominal pain or back pain. This been no significant change in his medical history. He has some generalized arthritis which is been stable. He has hyperlipidemia which is well-controlled on his current medications.  Past Medical History  Diagnosis Date  . Arthritis   . SOB (shortness of breath) on exertion   . Hyperlipidemia   . Stroke     TIA history 2005  . Hyperglycemia 01/04/2012    Family History  Problem Relation Age of Onset  . Other Mother     CHF  . Ulcers Mother     Venous stasis  . Other Father     CHF  . Cancer Sister     unknown  . Hip fracture Maternal Grandmother   . Pneumonia Maternal Grandmother     SOCIAL HISTORY: History  Substance Use Topics  . Smoking status: Former Smoker -- 3.0 packs/day    Quit date: 10/16/1960  . Smokeless tobacco: Never Used  . Alcohol Use: 8.4 oz/week    14 Cans of beer per week     1-2 beer daily    Allergies  Allergen Reactions  . Niacin And Related     Facial redness  . Omnipaque (Iohexol)     Facial redness and flushing    Current Outpatient Prescriptions  Medication Sig Dispense Refill  . clopidogrel (PLAVIX) 75 MG tablet Take 1 tablet (75 mg total) by mouth at bedtime.  90 tablet  1  . fish oil-omega-3 fatty acids 1000 MG capsule Take 500 mg by mouth daily. 2 daily       . gabapentin (NEURONTIN) 300 MG capsule Take 1 capsule (300 mg total) by mouth 2 (two) times daily.  60 capsule  3  . simvastatin (ZOCOR) 40 MG tablet Take 1 tablet (40 mg total) by mouth at bedtime.  90 tablet  1  . triazolam (HALCION) 0.125 MG tablet Take 1 tablet (0.125 mg total)  by mouth 2 (two) times daily as needed.  60 tablet  2  . Coenzyme Q-10 200 MG CAPS Take 200 mg by mouth daily.         No current facility-administered medications for this visit.   Facility-Administered Medications Ordered in Other Visits  Medication Dose Route Frequency Provider Last Rate Last Dose  . iohexol (OMNIPAQUE) 300 MG/ML solution 100 mL  100 mL Intravenous Once PRN Medication Radiologist, MD   100 mL at 01/21/12 1053    REVIEW OF SYSTEMS: Arly.Keller ] denotes positive finding; [  ] denotes negative finding  CARDIOVASCULAR:  [ ]  chest pain   [ ]  chest pressure   [ ]  palpitations   [ ]  orthopnea   [ ]  dyspnea on exertion   [ ]  claudication   [ ]  rest pain   [ ]  DVT   [ ]  phlebitis PULMONARY:   [ ]  productive cough   [ ]  asthma   [ ]  wheezing NEUROLOGIC:   [ ]  weakness  [ ]  paresthesias  [ ]  aphasia  [ ]  amaurosis  [ ]  dizziness HEMATOLOGIC:   [ ]  bleeding problems   [ ]   clotting disorders MUSCULOSKELETAL:  [ ]  joint pain   [ ]  joint swelling [ ]  leg swelling GASTROINTESTINAL: [ ]   blood in stool  [ ]   hematemesis GENITOURINARY:  [ ]   dysuria  [ ]   hematuria PSYCHIATRIC:  [ ]  history of major depression INTEGUMENTARY:  [ ]  rashes  [ ]  ulcers CONSTITUTIONAL:  [ ]  fever   [ ]  chills  PHYSICAL EXAM: Filed Vitals:   01/21/12 1123  BP: 127/69  Pulse: 107  Temp: 97.4 F (36.3 C)  TempSrc: Oral  Resp: 18  Height: 6\' 4"  (1.93 m)  Weight: 223 lb (101.152 kg)   Body mass index is 27.14 kg/(m^2). GENERAL: The patient is a well-nourished male, in no acute distress. The vital signs are documented above. CARDIOVASCULAR: There is a regular rate and rhythm without significant murmur appreciated. I do not detect carotid bruits. He has palpable femoral pulses and palpable dorsalis pedis pulses bilaterally. He has mild bilateral lower extremity swelling.  PULMONARY: There is good air exchange bilaterally without wheezing or rales. ABDOMEN: Soft and non-tender with normal pitched bowel sounds.  His aneurysm it is difficult to palpate because of his size. MUSCULOSKELETAL: There are no major deformities or cyanosis. NEUROLOGIC: No focal weakness or paresthesias are detected. SKIN: There are no ulcers or rashes noted. PSYCHIATRIC: The patient has a normal affect.  DATA:  I have independently reviewed his CT scan. By my measurement the maximum diameter is 4.7 cm. I compared this to his CAT scan a year ago and is been no significant change in size of his aneurysm. The right common iliac artery is ectatic at 2.4 cm in the left common iliac is also ectatic at 2.5 cm.  His creatinine was normal today  MEDICAL ISSUES:  AAA: This patient has a 4.7 cm infrarenal abdominal aortic aneurysm which has been stable in size. He understands we would not consider elective repair in a normal risk patient unless the aneurysm were greater than 5.5 cm. I've ordered a follow up ultrasound in 6 months. I'll plan on seeing him back in one year. It is any increase in size of his aneurysm in 6 months also him at that time. He knows to call sooner for has problems.  Navid Lenzen S [facility] [place]: [phone]

## 2012-01-30 ENCOUNTER — Ambulatory Visit (INDEPENDENT_AMBULATORY_CARE_PROVIDER_SITE_OTHER): Payer: Medicare Other | Admitting: Neurology

## 2012-01-30 ENCOUNTER — Encounter: Payer: Self-pay | Admitting: Neurology

## 2012-01-30 VITALS — BP 110/60 | HR 88 | Wt 224.0 lb

## 2012-01-30 DIAGNOSIS — G629 Polyneuropathy, unspecified: Secondary | ICD-10-CM

## 2012-01-30 DIAGNOSIS — G589 Mononeuropathy, unspecified: Secondary | ICD-10-CM

## 2012-01-30 NOTE — Patient Instructions (Signed)
Your MRI is scheduled at the [facility] right beside [facility]. The address is [address] [place]. Your appointment is Thursday, April 11th at 1:00 pm.  Please arrive 15 minutes prior to your scheduled appointment. [phone].  Your NCV/EMG is scheduled at [facility] located at [address] in [place] on Monday, April 29th at 10:15 am.  Please arrive 15 minutes prior to your scheduled appointment.  [phone]    Follow up with Dr. Modesto Charon in 3 months.

## 2012-01-30 NOTE — Progress Notes (Signed)
Dear Dr. Abner Greenspan,  I saw  Shawn Cortez back in [facility] for his problem with bilateral hand numbness.  As you may recall, he is a 78 y.o. year old male with a history of lumbar spinal stenosis surgery s/p decompression L3-L5 who has had progressive numbness and tingling in his hands mainly in the 2nd, 3rd and 4th finger on both sides.  At his last visit I felt this was likely due to a cervical radiculopathy.  Notably he has had a carpal tunnel surgery done on his right hand with no improvement.  We increased his gabapentin to 300 bid with no effect.  He does not report any change in his symptoms.  Medical history, social history, and family history were reviewed and have not changed since the last clinic visit.  Current Outpatient Prescriptions on File Prior to Visit  Medication Sig Dispense Refill  . clopidogrel (PLAVIX) 75 MG tablet Take 1 tablet (75 mg total) by mouth at bedtime.  90 tablet  1  . Coenzyme Q-10 200 MG CAPS Take 200 mg by mouth daily.        . fish oil-omega-3 fatty acids 1000 MG capsule Take 500 mg by mouth daily. 2 daily       . gabapentin (NEURONTIN) 300 MG capsule Take 1 capsule (300 mg total) by mouth 2 (two) times daily.  60 capsule  3  . simvastatin (ZOCOR) 40 MG tablet Take 1 tablet (40 mg total) by mouth at bedtime.  90 tablet  1  . triazolam (HALCION) 0.125 MG tablet Take 1 tablet (0.125 mg total) by mouth 2 (two) times daily as needed.  60 tablet  2    Allergies  Allergen Reactions  . Niacin And Related     Facial redness  . Omnipaque (Iohexol)     Facial redness and flushing    ROS:  13 systems were reviewed and are notable for severe gait ataxia.  This is likely multifactorial, with the main cause being his lumbar stenosis and also idiopathic neuropathy.  All other review of systems are unremarkable.  Exam: . Filed Vitals:   01/30/12 1456  BP: 110/60  Pulse: 88  Weight: 224 lb (101.606 kg)    In general, well appearing man.  H&N:   severe restriction of movement of his neck.  - Spurling's. Tinel's at wrist is negative. Tinel's at elbow negative. Cranial Nerves:   Pupils are equally round and reactive to light. Visual fields full to confrontation. Extraocular movements are intact without nystagmus. Facial sensation and muscles of mastication are intact. Muscles of facial expression are symmetric. Hearing decreased to finger run, particularly on the right. Tongue protrusion, uvula, palate midline. Shoulder shrug intact  Motor: The patient has normal decreased bulk in his right thenar muscles, no pronator drift. There are no adventitious movements.   5/5 muscle strength bilaterally.  Reflexes:   Biceps  Brachioradialis  Triceps  Patella  Ankles  Babinski   Right  2+  1+  2+  2+  0  mute   Left  2+  2+  1+  0  0  mute   Coordination: Normal finger to nose. No dysdiadokinesia.  Sensation is decreased to vibration and temperature in feet, but not clearly length dependent. In hands there may be a dermatomal pattern of loss and again not clear length dep, but perhaps C6 more involved at least in right hand.  Impression/Recommendations:  1.  Bilateral hand numbness - I am going to proceed  with a bilateral UE EMG/NCS.  At the same time I am going to get an MRI of his C-spine.  I suspect his hand numbness may be coming from a radiculopathy.  It still could be due to carpal tunnel syndrome, despite his lack of improvement after her carpal tunnel release on the right however.  We will see the patient back in 2 months.  Lupita Raider Modesto Charon, MD [facility], [place]

## 2012-02-01 ENCOUNTER — Encounter: Payer: Self-pay | Admitting: Neurology

## 2012-02-04 ENCOUNTER — Other Ambulatory Visit: Payer: Self-pay

## 2012-02-04 MED ORDER — TRIAZOLAM 0.125 MG PO TABS: 0.1250 mg | ORAL_TABLET | Freq: Two times a day (BID) | ORAL | Status: DC | PRN

## 2012-02-04 NOTE — Telephone Encounter (Signed)
Pt called stating he needed the Triazolam sent to [facility] on [address]. Pt states he lost his other RX. RX printed and faxed to pharmacy

## 2012-02-05 ENCOUNTER — Ambulatory Visit (HOSPITAL_COMMUNITY)
Admission: RE | Admit: 2012-02-05 | Discharge: 2012-02-05 | Disposition: A | Discharge status: Home / Self Care | Payer: Medicare Other | Source: Ambulatory Visit | Attending: Neurology | Admitting: Neurology

## 2012-02-05 DIAGNOSIS — Q7649 Other congenital malformations of spine, not associated with scoliosis: Secondary | ICD-10-CM | POA: Insufficient documentation

## 2012-02-05 DIAGNOSIS — G589 Mononeuropathy, unspecified: Secondary | ICD-10-CM | POA: Insufficient documentation

## 2012-02-05 DIAGNOSIS — G609 Hereditary and idiopathic neuropathy, unspecified: Secondary | ICD-10-CM | POA: Insufficient documentation

## 2012-02-05 DIAGNOSIS — G629 Polyneuropathy, unspecified: Secondary | ICD-10-CM

## 2012-02-20 ENCOUNTER — Telehealth: Payer: Self-pay | Admitting: Neurology

## 2012-02-20 NOTE — Telephone Encounter (Signed)
Message copied by Benay Spice on Fri Feb 20, 2012  9:50 AM ------      Message from: Milas Gain      Created: Fri Feb 20, 2012  9:27 AM       Let Mr. Brinkley know that his MRI of his C-spine surprisingly did not show any explanation of his bilateral hand weakness and numbness.  We will await the results of his EMG/NCS.

## 2012-02-20 NOTE — Telephone Encounter (Signed)
Spoke with the patient. Information given as per Dr. Modesto Charon below re: MRI results. Patient having his NCV/EMG on Monday. No further questions or concerns voiced at this time.

## 2012-02-24 ENCOUNTER — Other Ambulatory Visit: Payer: Self-pay | Admitting: Family Medicine

## 2012-02-24 MED ORDER — CLOPIDOGREL BISULFATE 75 MG PO TABS: 75.0000 mg | ORAL_TABLET | Freq: Every day | ORAL | Status: DC

## 2012-02-24 MED ORDER — SIMVASTATIN 40 MG PO TABS: 40.0000 mg | ORAL_TABLET | Freq: Every day | ORAL | Status: DC

## 2012-02-24 NOTE — Telephone Encounter (Signed)
RX sent

## 2012-02-26 ENCOUNTER — Telehealth: Payer: Self-pay | Admitting: Neurology

## 2012-02-26 NOTE — Telephone Encounter (Signed)
Pt called back to leave a cell phone number: [phone]

## 2012-02-26 NOTE — Telephone Encounter (Signed)
relayed that he had severe bilateral CTS.  doesn't want to see a hand surgeon now.  Is going to try to increase gabapentin to 600 tid.

## 2012-02-26 NOTE — Telephone Encounter (Signed)
Pt had NCS/EMG done on Monday 02/23/2012. Pt stated that you requested he call in a couple of days for the results. He is leaving town tomorrow and asks that you return his call today.

## 2012-03-01 ENCOUNTER — Other Ambulatory Visit: Payer: Self-pay | Admitting: Family Medicine

## 2012-03-01 ENCOUNTER — Ambulatory Visit (HOSPITAL_BASED_OUTPATIENT_CLINIC_OR_DEPARTMENT_OTHER)
Admission: RE | Admit: 2012-03-01 | Discharge: 2012-03-01 | Disposition: A | Discharge status: Home / Self Care | Payer: Medicare Other | Source: Ambulatory Visit | Attending: Family Medicine | Admitting: Family Medicine

## 2012-03-01 ENCOUNTER — Encounter: Payer: Self-pay | Admitting: Family Medicine

## 2012-03-01 ENCOUNTER — Ambulatory Visit (INDEPENDENT_AMBULATORY_CARE_PROVIDER_SITE_OTHER): Payer: Medicare Other | Admitting: Family Medicine

## 2012-03-01 VITALS — BP 112/62 | HR 99 | Temp 98.5°F | Ht 76.0 in | Wt 224.8 lb

## 2012-03-01 DIAGNOSIS — Z96659 Presence of unspecified artificial knee joint: Secondary | ICD-10-CM

## 2012-03-01 DIAGNOSIS — R29898 Other symptoms and signs involving the musculoskeletal system: Secondary | ICD-10-CM

## 2012-03-01 DIAGNOSIS — M79609 Pain in unspecified limb: Secondary | ICD-10-CM

## 2012-03-01 DIAGNOSIS — M25559 Pain in unspecified hip: Secondary | ICD-10-CM

## 2012-03-01 DIAGNOSIS — M8448XA Pathological fracture, other site, initial encounter for fracture: Secondary | ICD-10-CM

## 2012-03-01 DIAGNOSIS — M4120 Other idiopathic scoliosis, site unspecified: Secondary | ICD-10-CM | POA: Insufficient documentation

## 2012-03-01 DIAGNOSIS — M79604 Pain in right leg: Secondary | ICD-10-CM

## 2012-03-01 DIAGNOSIS — M5380 Other specified dorsopathies, site unspecified: Secondary | ICD-10-CM

## 2012-03-01 DIAGNOSIS — M948X9 Other specified disorders of cartilage, unspecified sites: Secondary | ICD-10-CM

## 2012-03-01 DIAGNOSIS — M6281 Muscle weakness (generalized): Secondary | ICD-10-CM | POA: Insufficient documentation

## 2012-03-01 DIAGNOSIS — M5137 Other intervertebral disc degeneration, lumbosacral region: Secondary | ICD-10-CM | POA: Insufficient documentation

## 2012-03-01 DIAGNOSIS — M47817 Spondylosis without myelopathy or radiculopathy, lumbosacral region: Secondary | ICD-10-CM | POA: Insufficient documentation

## 2012-03-01 DIAGNOSIS — M48061 Spinal stenosis, lumbar region without neurogenic claudication: Secondary | ICD-10-CM

## 2012-03-01 DIAGNOSIS — M51379 Other intervertebral disc degeneration, lumbosacral region without mention of lumbar back pain or lower extremity pain: Secondary | ICD-10-CM | POA: Insufficient documentation

## 2012-03-01 HISTORY — DX: Other symptoms and signs involving the musculoskeletal system: R29.898

## 2012-03-01 HISTORY — DX: Pain in right leg: M79.604

## 2012-03-01 MED ORDER — METHYLPREDNISOLONE 4 MG PO KIT: PACK | ORAL | Status: AC

## 2012-03-01 NOTE — Progress Notes (Signed)
Patient ID: Shawn Cortez, male   DOB: June 13, 1925, 78 y.o.   MRN: [id number] Spoke with patient's wife and clarified his past history. His spinal surgery in November 2005 was done in [place], [place] by Dr Harlon Flor. They actually went in and debrided some areas of spinal stenosis at L1 through to L4

## 2012-03-01 NOTE — Assessment & Plan Note (Signed)
Medrol dosepak no strenuous activity til xray results available

## 2012-03-01 NOTE — Assessment & Plan Note (Signed)
He suffered a fall in his home roughly 2 weeks ago but was doing well until 3 days ago. He reports having some pain in his right anterior hip early on Friday then sat [place] he tried to get out of the car and was physically unable to lift his quadricep op care to pull his leg out of the car. He describes weakness in his quadriceps. He has full sensation and movement in his foot other than being unable to pull his foot off the ground due to the quadricep. He denies incontinence. This pain is not a major symptom but it is a component. Due to the fall we will check x-rays of his lumbar spine, right hip and right knee today and await results. He is referred to orthopedics at this time for further evaluation and we'll await x-ray results before deciding if he needs neurosurgery as well. He will seek immediate care symptoms worsen. We'll start him on a Medrol Dosepak Today

## 2012-03-01 NOTE — Progress Notes (Signed)
Patient ID: Shawn Cortez, male   DOB: [birth year], 78 y.o.   MRN: [id number] Deone Leifheit [id number] 03-Apr-1925 03/01/2012      Progress Note-Follow Up  Subjective  Chief Complaint  Chief Complaint  Patient presents with  . leg weak    right leg- between knee and hip X 4 days    HPI  78 year old Caucasian male. He is here today accompanied by his wife. They report last Friday roughly 4 days ago he began to struggle with some pain in his right hip and then ultimately weakness in his right quadricep muscle. A week ago he was sitting on a chair in his kitchen in the legs broke out from under him and he fell down he says backwards into the corner striking his left arm but also his left side. Other than a bruise versus on his left shoulder he says he doesn't feel like he was left with any deficits or increased pain after that. Then 3-4 days ago he woke up with some mild discomfort and stiffness in his right hip. He then drove to a bald [place] when he tried to get out of the car he was unable to lift his right leg out of the car. He reported a seated position he is minimal to no control over his right quadricep. If he is in a slightly more stretched out position he does have more control over his quadricep is able to lift his foot more. At this point in the room he can not manage his quadricep and off basis but only off the floor. Left side is no difficulty. He denies any incontinence. Denies any actual back pain. He did have spinal surgery at L4-L5 back in 2005 for spinal stenosis but had been doing well until this past May. Today he has minimal discomfort in his anterior right hip as well as some minimal discomfort in his right knee. He does have a history of bilateral knee replacements in the past. He denies any swelling or ecchymosis. He denies any numbness tingling or weakness in the calf or in the foot. No other neurologic complaints, chest pain, palpitations or shortness of breath of note  he  Past Medical History  Diagnosis Date  . Arthritis   . SOB (shortness of breath) on exertion   . Hyperlipidemia   . Stroke     TIA history 2005  . Hyperglycemia 01/04/2012  . Leg pain, right 03/01/2012  . Right leg weakness 03/01/2012    Past Surgical History  Procedure Date  . Thumb tendon severed and repaired     right, tendon snapped  . Carpal tunnel release     Right  . Achilles tendon repair     left, repaired w/ wire  . Brain surgery     done by Dr Peter Garter for tinnitius, unsuccessful, only on right  . Inguinal herniorrhapy years ago    right and then 2 on left  . Lumbar laminectomy     L3-L5 posterior laminectomy 2006  . Joint replacement     Bilateral total knee replacement    Family History  Problem Relation Age of Onset  . Other Mother     CHF  . Ulcers Mother     Venous stasis  . Other Father     CHF  . Cancer Sister     unknown  . Hip fracture Maternal Grandmother   . Pneumonia Maternal Grandmother     History   Social History  . Marital Status: Married  Spouse Name: N/A    Number of Children: N/A  . Years of Education: N/A   Occupational History  . Not on file.   Social History Main Topics  . Smoking status: Former Smoker -- 3.0 packs/day    Quit date: 10/16/1960  . Smokeless tobacco: Never Used  . Alcohol Use: 8.4 oz/week    14 Cans of beer per week     1-2 beer daily  . Drug Use: No  . Sexually Active: Not on file   Other Topics Concern  . Not on file   Social History Narrative  . No narrative on file    Current Outpatient Prescriptions on File Prior to Visit  Medication Sig Dispense Refill  . clopidogrel (PLAVIX) 75 MG tablet Take 1 tablet (75 mg total) by mouth at bedtime.  90 tablet  1  . Coenzyme Q-10 200 MG CAPS Take 200 mg by mouth daily.        . fish oil-omega-3 fatty acids 1000 MG capsule Take 500 mg by mouth daily. 2 daily       . simvastatin (ZOCOR) 40 MG tablet Take 1 tablet (40 mg total) by mouth at bedtime.  90  tablet  1  . triazolam (HALCION) 0.125 MG tablet Take 1 tablet (0.125 mg total) by mouth 2 (two) times daily as needed.  60 tablet  2  . DISCONTD: gabapentin (NEURONTIN) 300 MG capsule Take 1 capsule (300 mg total) by mouth 2 (two) times daily.  60 capsule  3    Allergies  Allergen Reactions  . Niacin And Related     Facial redness  . Omnipaque (Iohexol)     Facial redness and flushing    Review of Systems  Review of Systems  Constitutional: Negative for fever and malaise/fatigue.  HENT: Negative for congestion.   Eyes: Negative for discharge.  Respiratory: Negative for shortness of breath.   Cardiovascular: Negative for chest pain, palpitations and leg swelling.  Gastrointestinal: Negative for nausea, abdominal pain and diarrhea.  Genitourinary: Negative for dysuria.  Musculoskeletal: Positive for joint pain. Negative for falls.       Right hip and right knee are intermittent. Worse over last several days. Have had both knees replaced in past.  Skin: Negative for rash.  Neurological: Positive for focal weakness. Negative for loss of consciousness and headaches.  Endo/Heme/Allergies: Negative for polydipsia.  Psychiatric/Behavioral: Negative for depression and suicidal ideas. The patient is not nervous/anxious and does not have insomnia.     Objective  BP 112/62  Pulse 99  Temp(Src) 98.5 F (36.9 C) (Temporal)  Ht 6\' 4"  (1.93 m)  Wt 224 lb 12.8 oz (101.969 kg)  BMI 27.36 kg/m2  SpO2 99%  Physical Exam  Physical Exam  Constitutional: He is oriented to person, place, and time and well-developed, well-nourished, and in no distress. No distress.  HENT:  Head: Normocephalic and atraumatic.  Eyes: Conjunctivae are normal.  Neck: Neck supple. No thyromegaly present.  Cardiovascular: Normal rate and regular rhythm.   Murmur heard.      1/6 M  Pulmonary/Chest: Effort normal and breath sounds normal. No respiratory distress.  Abdominal: He exhibits no distension and no  mass. There is no tenderness.  Musculoskeletal: He exhibits no edema and no tenderness.  Neurological: He is alert and oriented to person, place, and time.       Unsteady gait. Difficulty illiciting patellar reflexes b/l. Patient has to use his hands to move his right foot forward while in a  seated position but is able to move the foot at will otherwise. Denies any decreased sensation in right thigh vs left thigh. Patient unable to get on the table comfortably.   Skin: Skin is warm.  Psychiatric: Memory, affect and judgment normal.    Lab Results  Component Value Date   TSH 2.532 12/26/2011   Lab Results  Component Value Date   WBC 6.0 12/26/2011   HGB 14.2 12/26/2011   HCT 43.5 12/26/2011   MCV 92.0 12/26/2011   PLT 220 12/26/2011   Lab Results  Component Value Date   CREATININE 1.12 01/16/2012   BUN 19 01/16/2012   NA 136 12/26/2011   K 4.0 12/26/2011   CL 101 12/26/2011   CO2 26 12/26/2011   Lab Results  Component Value Date   ALT 17 12/26/2011   AST 24 12/26/2011   ALKPHOS 74 12/26/2011   BILITOT 0.7 12/26/2011   Lab Results  Component Value Date   CHOL 131 12/26/2011   Lab Results  Component Value Date   HDL 47.20 12/26/2011   Lab Results  Component Value Date   LDLCALC 64 12/26/2011   Lab Results  Component Value Date   TRIG 100.0 12/26/2011   Lab Results  Component Value Date   CHOLHDL 3 12/26/2011     Assessment & Plan  Right leg weakness He suffered a fall in his home roughly 2 weeks ago but was doing well until 3 days ago. He reports having some pain in his right anterior hip early on Friday then sat for a long car ride [place] when he tried to get out of the car and was physically unable to lift his quadricep op care to pull his leg out of the car. He describes weakness in his quadriceps. He has full sensation and movement in his foot other than being unable to pull his foot off the ground due to the quadricep. He denies incontinence. This pain is not a major symptom but it is  a component. Due to the fall we will check x-rays of his lumbar spine, right hip and right knee today and await results. He is referred to orthopedics at this time for further evaluation and we'll await x-ray results before deciding if he needs neurosurgery as well. He will seek immediate care symptoms worsen. We'll start him on a Medrol Dosepak Today  SPINAL STENOSIS, LUMBAR Patient describes loss of strength of his anterior thigh muscle. May benefit from referral to neursurgery if no response to Medrol and xrays today are inconclusive or hip pathology  Leg pain, right Medrol dosepak no strenuous activity til xray results available

## 2012-03-01 NOTE — Assessment & Plan Note (Signed)
Patient describes loss of strength of his anterior thigh muscle. May benefit from referral to neursurgery if no response to Medrol and xrays today are inconclusive or hip pathology

## 2012-03-01 NOTE — Patient Instructions (Signed)
Hip Pain  The hips join the upper legs to the lower pelvis. The bones, cartilage, tendons, and muscles of the hip joint perform a lot of work each day holding your body weight and allowing you to move around.  Hip pain is a common symptom. It can range from a minor ache to severe pain on 1 or both hips. Pain may be felt on the inside of the hip joint near the groin, or the outside near the buttocks and upper thigh. There may be swelling or stiffness as well. It occurs more often when a person walks or performs activity. There are many reasons hip pain can develop.  CAUSES   It is important to work with your caregiver to identify the cause since many conditions can impact the bones, cartilage, muscles, and tendons of the hips. Causes for hip pain include:   Broken (fractured) bones.   Separation of the thighbone from the hip socket (dislocation).   Torn cartilage of the hip joint.   Swelling (inflammation) of a tendon (tendonitis), the sac within the hip joint (bursitis), or a joint.   A weakening in the abdominal wall (hernia), affecting the nerves to the hip.   Arthritis in the hip joint or lining of the hip joint.   Pinched nerves in the back, hip, or upper thigh.   A bulging disc in the spine (herniated disc).   Rarely, bone infection or cancer.  DIAGNOSIS   The location of your hip pain will help your caregiver understand what may be causing the pain. A diagnosis is based on your medical history, your symptoms, results from your physical exam, and results from diagnostic tests. Diagnostic tests may include X-ray exams, a computerized magnetic scan (magnetic resonance imaging, MRI), or bone scan.  TREATMENT   Treatment will depend on the cause of your hip pain. Treatment may include:   Limiting activities and resting until symptoms improve.   Crutches or other walking supports (a cane or brace).   Ice, elevation, and compression.   Physical therapy or home exercises.   Shoe inserts or special  shoes.   Losing weight.   Medications to reduce pain.   Undergoing surgery.  HOME CARE INSTRUCTIONS    Only take over-the-counter or prescription medicines for pain, discomfort, or fever as directed by your caregiver.   Put ice on the injured area:   Put ice in a plastic bag.   Place a towel between your skin and the bag.   Leave the ice on for 15 to 20 minutes at a time, 3 to 4 times a day.   Keep your leg raised (elevated) when possible to lessen swelling.   Avoid activities that cause pain.   Follow specific exercises as directed by your caregiver.   Sleep with a pillow between your legs on your most comfortable side.   Record how often you have hip pain, the location of the pain, and what it feels like. This information may be helpful to you and your caregiver.   Ask your caregiver about returning to work or sports and whether you should drive.   Follow up with your caregiver for further exams, therapy, or testing as directed.  SEEK MEDICAL CARE IF:    Your pain or swelling continues or worsens after 1 week.   You are feeling unwell or have chills.   You have increasing difficulty with walking.   You have a loss of sensation or other new symptoms.   You have questions   or concerns.  SEEK IMMEDIATE MEDICAL CARE IF:    You cannot put weight on the affected hip.   You have fallen.   You have a sudden increase in pain and swelling in your hip.   You have a fever.  MAKE SURE YOU:    Understand these instructions.   Will watch your condition.   Will get help right away if you are not doing well or get worse.  Document Released: 04/02/2010 Document Revised: 10/02/2011 Document Reviewed: 04/02/2010  [employer] Patient Information 2012 [employer].

## 2012-03-02 ENCOUNTER — Encounter: Payer: Self-pay | Admitting: Neurology

## 2012-03-04 ENCOUNTER — Telehealth: Payer: Self-pay

## 2012-03-04 NOTE — Telephone Encounter (Signed)
Patient called stating that he sees Dr Maeola Harman on Monday but would like a second opinion with Dr Marthenia Rolling with [facility] also. Pt would like MD to put a referral in for him. I informed patient that MD is out until Monday and he stated that this was okay. Please advise?

## 2012-03-08 NOTE — Telephone Encounter (Signed)
So he needs to have his ortho notes forwarded to me and then he should come in so I can document why he needs a second opinion otherwise insurance might not cover it

## 2012-03-08 NOTE — Telephone Encounter (Signed)
Pt left a message stating to hold off right now for the referral

## 2012-03-17 ENCOUNTER — Other Ambulatory Visit: Payer: Self-pay | Admitting: Neurosurgery

## 2012-03-17 DIAGNOSIS — M545 Low back pain: Secondary | ICD-10-CM

## 2012-03-23 ENCOUNTER — Other Ambulatory Visit: Payer: Self-pay

## 2012-03-23 ENCOUNTER — Telehealth: Payer: Self-pay

## 2012-03-23 MED ORDER — METHYLPREDNISOLONE 4 MG PO KIT: PACK | ORAL | Status: DC

## 2012-03-23 MED ORDER — GABAPENTIN 300 MG PO CAPS: 600.0000 mg | ORAL_CAPSULE | Freq: Three times a day (TID) | ORAL | Status: DC

## 2012-03-23 NOTE — Telephone Encounter (Signed)
Pt calling about his gabapentin, it is working wonderful, but he would like to discuss changing the dosage.  He is currently up to 600mg  three times a day and would like a refill sent for 90 day supply to his pharmacy.  [facility] [address]  Pt going out of town next week.    Ok per Dr. Modesto Charon.  [facility]

## 2012-03-23 NOTE — Telephone Encounter (Signed)
Pt called stating he is going out of town and would like the Methylprednisolone called into the pharmacy so he'll have it just in case? Please advise?

## 2012-03-23 NOTE — Telephone Encounter (Signed)
RX sent and patient informed.  

## 2012-03-23 NOTE — Telephone Encounter (Signed)
OK to give one refill on the medrol dosepak to use as needed

## 2012-03-25 ENCOUNTER — Ambulatory Visit
Admission: RE | Admit: 2012-03-25 | Discharge: 2012-03-25 | Disposition: A | Discharge status: Home / Self Care | Payer: Medicare Other | Source: Ambulatory Visit | Attending: Neurosurgery | Admitting: Neurosurgery

## 2012-03-25 DIAGNOSIS — M545 Low back pain: Secondary | ICD-10-CM

## 2012-03-25 MED ORDER — GADOBENATE DIMEGLUMINE 529 MG/ML IV SOLN
20.0000 mL | Freq: Once | INTRAVENOUS | Status: AC | PRN
  Administered 2012-03-25: 20 mL via INTRAVENOUS

## 2012-04-07 ENCOUNTER — Ambulatory Visit: Payer: Medicare Other | Admitting: Family Medicine

## 2012-04-19 ENCOUNTER — Ambulatory Visit (INDEPENDENT_AMBULATORY_CARE_PROVIDER_SITE_OTHER): Payer: Medicare Other | Admitting: Family Medicine

## 2012-04-19 ENCOUNTER — Encounter: Payer: Self-pay | Admitting: Family Medicine

## 2012-04-19 VITALS — BP 125/69 | HR 90 | Temp 98.0°F | Ht 76.0 in | Wt 223.8 lb

## 2012-04-19 DIAGNOSIS — R739 Hyperglycemia, unspecified: Secondary | ICD-10-CM

## 2012-04-19 DIAGNOSIS — M48061 Spinal stenosis, lumbar region without neurogenic claudication: Secondary | ICD-10-CM

## 2012-04-19 DIAGNOSIS — R269 Unspecified abnormalities of gait and mobility: Secondary | ICD-10-CM

## 2012-04-19 DIAGNOSIS — R7309 Other abnormal glucose: Secondary | ICD-10-CM

## 2012-04-19 DIAGNOSIS — E785 Hyperlipidemia, unspecified: Secondary | ICD-10-CM

## 2012-04-19 NOTE — Assessment & Plan Note (Signed)
Now following with ortho and is supposed to have a steroid injection in the spine soon, encouraged to proceed may hold Plavix daily x 7 days. Is feeling much stronger and his pain improved greatly with the oral steroids we gave him at his last visit. He has a refill if he needs it

## 2012-04-19 NOTE — Patient Instructions (Addendum)
Spinal Stenosis One cause of back pain is spinal stenosis. Stenosis means abnormal narrowing. The spinal canal contains and protects the spinal nerve roots. In spinal stenosis, the spinal canal narrows and pinches the spinal cord and nerves. This causes low back pain and pain in the legs. Stenosis may pinch the nerves that control muscles and sensation in the legs. This leads to pain and abnormal feelings in the leg muscles and areas supplied by those nerves. CAUSES  Spinal stenosis often happens to people as they get older and arthritic boney growths occur in their spinal canal. There is also a loss of the disk height between the bones of the back, which also adds to this problem. Sometimes the problem is present at birth. SYMPTOMS   Pain that is generally worse with activities, particularly standing and walking.   Numbness, tingling, hot or cold feelings, weakness, or a weariness in the legs.   Clumsiness, frequent falling, and a foot-slapping gait, which may come as a result of nerve pressure and muscle weakness.  DIAGNOSIS   Your caregiver may suspect spinal stenosis if you have unusual leg symptoms, such as those previously mentioned.   Your orthopedic surgeon may request special imaging exams, such a computerized magnetic scan (MRI) or computerized X-ray scan (CT) to find out the cause of the problem.  TREATMENT   Sometimes treatments such as postural changes or nonsteroidal anti-inflammatory drugs will relieve the pain.   Nonsteroidal anti-inflammatory medications may help relieve symptoms. These medicines do this by decreasing swelling and inflammation in the nerves.   When stenosis causes severe nerve root compression, conservative treatment may not be enough to maintain a normal lifestyle. Surgery may be recommended to relieve the pressure on affected nerves. In properly selected patients, the results are very good, and patients are able to continue a normal lifestyle.  HOME CARE  INSTRUCTIONS   Flexing the spine by leaning forward while walking may relieve symptoms. Lying with the knees drawn up to the chest may offer some relief. These positions enlarge the space available to the nerves. They may make it easier for stenosis sufferers to walk longer distances.   Rest, followed by gradually resuming activity, also can help.   Aerobic activity, such as bicycling or swimming, is often recommended.   Losing weight can also relieve some of the load on the spine.   Application of warm or cold compresses to the area of pain can be helpful.  SEEK MEDICAL CARE IF:   The periods of relief between episodes of pain become shorter and shorter.   You experience pain that radiates down your leg, even when you are not standing or walking.  SEEK IMMEDIATE MEDICAL CARE IF:   You have a loss of bowel or bladder control.   You have a sudden loss of feeling in your legs.   You suddenly cannot move your legs.  Document Released: 01/03/2004 Document Revised: 10/02/2011 Document Reviewed: 02/28/2010 [facility] Patient Information 2012 [place], [place].

## 2012-04-19 NOTE — Assessment & Plan Note (Signed)
Last hgba1c was 6.1 will repeat prior to next visit. Minimize simple carbs

## 2012-04-19 NOTE — Assessment & Plan Note (Signed)
Using walker and walking better today

## 2012-04-19 NOTE — Assessment & Plan Note (Signed)
Repeat lipid panel prior to next visit. and continue Simvastatin

## 2012-04-19 NOTE — Progress Notes (Signed)
Patient ID: Shawn Cortez, male   DOB: Aug 16, 1925, 78 y.o.   MRN: [id number] Shawn Cortez [id number] 1925/06/15 04/19/2012      Progress Note-Follow Up  Subjective  Chief Complaint  Chief Complaint  Patient presents with  . Follow-up    3 month     HPI  Patient is an 78 year old Caucasian male who is in today for followup. Overall he is doing much better. His right lower extremity pain and weakness improved greatly with his steroid dose pack. He has been seen now by orthopedics and they're working and getting him scheduled for some steroid injections for his significant lower back pathology. At this point his right leg is strong enough for him he still needs to physically lift the leg to get in the car. No incontinence. No falls. No numbness, weakness. Otherwise  He reports he feels well. No chest pain, palpitations, shortness of breath, fevers, chills, chest pain, GI or GU complaints noted today.  Past Medical History  Diagnosis Date  . Arthritis   . SOB (shortness of breath) on exertion   . Hyperlipidemia   . Stroke     TIA history 2005  . Hyperglycemia 01/04/2012  . Leg pain, right 03/01/2012  . Right leg weakness 03/01/2012    Past Surgical History  Procedure Date  . Thumb tendon severed and repaired     right, tendon snapped  . Carpal tunnel release     Right  . Achilles tendon repair     left, repaired w/ wire  . Brain surgery     done by Dr Shawn Cortez for tinnitius, unsuccessful, only on right  . Inguinal herniorrhapy years ago    right and then 2 on left  . Lumbar laminectomy     L3-L5 posterior laminectomy 2006  . Joint replacement     Bilateral total knee replacement    Family History  Problem Relation Age of Onset  . Other Mother     CHF  . Ulcers Mother     Venous stasis  . Other Father     CHF  . Cancer Sister     unknown  . Hip fracture Maternal Grandmother   . Pneumonia Maternal Grandmother     History   Social History  . Marital Status:  Married    Spouse Name: N/A    Number of Children: N/A  . Years of Education: N/A   Occupational History  . Not on file.   Social History Main Topics  . Smoking status: Former Smoker -- 3.0 packs/day    Quit date: 10/16/1960  . Smokeless tobacco: Never Used  . Alcohol Use: 8.4 oz/week    14 Cans of beer per week     1-2 beer daily  . Drug Use: No  . Sexually Active: Not on file   Other Topics Concern  . Not on file   Social History Narrative  . No narrative on file    Current Outpatient Prescriptions on File Prior to Visit  Medication Sig Dispense Refill  . clopidogrel (PLAVIX) 75 MG tablet Take 1 tablet (75 mg total) by mouth at bedtime.  90 tablet  1  . Coenzyme Q-10 200 MG CAPS Take 200 mg by mouth daily.        . fish oil-omega-3 fatty acids 1000 MG capsule Take 500 mg by mouth daily. 2 daily       . gabapentin (NEURONTIN) 300 MG capsule Take 2 capsules (600 mg total) by mouth 3 (three) times  daily.  540 capsule  3  . simvastatin (ZOCOR) 40 MG tablet Take 1 tablet (40 mg total) by mouth at bedtime.  90 tablet  1  . triazolam (HALCION) 0.125 MG tablet Take 1 tablet (0.125 mg total) by mouth 2 (two) times daily as needed.  60 tablet  2  . methylPREDNISolone (MEDROL DOSEPAK) 4 MG tablet Follow package directions  21 tablet  0    Allergies  Allergen Reactions  . Niacin And Related     Facial redness  . Omnipaque (Iohexol)     Facial redness and flushing    Review of Systems  Review of Systems  Constitutional: Negative for fever and malaise/fatigue.  HENT: Negative for congestion.   Eyes: Negative for discharge.  Respiratory: Negative for shortness of breath.   Cardiovascular: Negative for chest pain, palpitations and leg swelling.  Gastrointestinal: Negative for nausea, abdominal pain and diarrhea.  Genitourinary: Negative for dysuria.  Musculoskeletal: Positive for back pain. Negative for falls.  Skin: Negative for rash.  Neurological: Positive for focal  weakness. Negative for loss of consciousness and headaches.       RLE weakness still present but greatly improved  Endo/Heme/Allergies: Negative for polydipsia.  Psychiatric/Behavioral: Negative for depression and suicidal ideas. The patient is not nervous/anxious and does not have insomnia.     Objective  BP 125/69  Pulse 90  Temp 98 F (36.7 C) (Temporal)  Ht 6\' 4"  (1.93 m)  Wt 223 lb 12.8 oz (101.515 kg)  BMI 27.24 kg/m2  SpO2 96%  Physical Exam  Physical Exam  Constitutional: He is oriented to person, place, and time and well-developed, well-nourished, and in no distress. No distress.  HENT:  Head: Normocephalic and atraumatic.  Eyes: Conjunctivae are normal.  Neck: Neck supple. No thyromegaly present.  Cardiovascular: Normal rate and regular rhythm.   Murmur heard.      1/6 systolic M  Pulmonary/Chest: Effort normal and breath sounds normal. No respiratory distress.  Abdominal: He exhibits no distension and no mass. There is no tenderness.  Musculoskeletal: He exhibits no edema.  Neurological: He is alert and oriented to person, place, and time.       Unsteady gait  Skin: Skin is warm.  Psychiatric: Memory, affect and judgment normal.    Lab Results  Component Value Date   TSH 2.532 12/26/2011   Lab Results  Component Value Date   WBC 6.0 12/26/2011   HGB 14.2 12/26/2011   HCT 43.5 12/26/2011   MCV 92.0 12/26/2011   PLT 220 12/26/2011   Lab Results  Component Value Date   CREATININE 1.12 01/16/2012   BUN 19 01/16/2012   NA 136 12/26/2011   K 4.0 12/26/2011   CL 101 12/26/2011   CO2 26 12/26/2011   Lab Results  Component Value Date   ALT 17 12/26/2011   AST 24 12/26/2011   ALKPHOS 74 12/26/2011   BILITOT 0.7 12/26/2011   Lab Results  Component Value Date   CHOL 131 12/26/2011   Lab Results  Component Value Date   HDL 47.20 12/26/2011   Lab Results  Component Value Date   LDLCALC 64 12/26/2011   Lab Results  Component Value Date   TRIG 100.0 12/26/2011   Lab Results    Component Value Date   CHOLHDL 3 12/26/2011     Assessment & Plan  SPINAL STENOSIS, LUMBAR Now following with ortho and is supposed to have a steroid injection in the spine soon, encouraged to proceed may hold  Plavix daily x 7 days. Is feeling much stronger and his pain improved greatly with the oral steroids we gave him at his last visit. He has a refill if he needs it  Hyperglycemia Last hgba1c was 6.1 will repeat prior to next visit. Minimize simple carbs  UNSTEADY GAIT Using walker and walking better today  HYPERLIPIDEMIA Repeat lipid panel prior to next visit. and continue Simvastatin

## 2012-06-10 ENCOUNTER — Other Ambulatory Visit (INDEPENDENT_AMBULATORY_CARE_PROVIDER_SITE_OTHER): Payer: Medicare Other

## 2012-06-10 DIAGNOSIS — R7309 Other abnormal glucose: Secondary | ICD-10-CM

## 2012-06-10 DIAGNOSIS — E785 Hyperlipidemia, unspecified: Secondary | ICD-10-CM

## 2012-06-10 LAB — CBC
HCT: 42.5 % (ref 39.0–52.0)
MCV: 95.1 fl (ref 78.0–100.0)
RDW: 15.2 % — ABNORMAL HIGH (ref 11.5–14.6)
WBC: 7.4 10*3/uL (ref 4.5–10.5)

## 2012-06-10 LAB — HEPATIC FUNCTION PANEL
ALT: 18 U/L (ref 0–53)
Albumin: 3.8 g/dL (ref 3.5–5.2)
Bilirubin, Direct: 0 mg/dL (ref 0.0–0.3)
Total Protein: 7.1 g/dL (ref 6.0–8.3)

## 2012-06-10 LAB — RENAL FUNCTION PANEL
Albumin: 3.8 g/dL (ref 3.5–5.2)
GFR: 81.04 mL/min (ref >60.00)
Glucose, Bld: 99 mg/dL (ref 70–99)
Phosphorus: 3.2 mg/dL (ref 2.3–4.6)
Potassium: 4.7 mEq/L (ref 3.5–5.1)

## 2012-06-10 LAB — HEMOGLOBIN A1C: Hgb A1c MFr Bld: 6.1 % (ref 4.6–6.5)

## 2012-06-10 LAB — LIPID PANEL
Cholesterol: 136 mg/dL (ref 0–200)
HDL: 54.2 mg/dL (ref >39.00)
Triglycerides: 94 mg/dL (ref 0.0–149.0)
VLDL: 18.8 mg/dL (ref 0.0–40.0)

## 2012-06-11 ENCOUNTER — Other Ambulatory Visit: Payer: Medicare Other

## 2012-06-18 ENCOUNTER — Encounter: Payer: Self-pay | Admitting: Family Medicine

## 2012-06-18 ENCOUNTER — Ambulatory Visit (INDEPENDENT_AMBULATORY_CARE_PROVIDER_SITE_OTHER): Payer: Medicare Other | Admitting: Family Medicine

## 2012-06-18 VITALS — BP 117/65 | HR 91 | Temp 97.4°F | Ht 76.0 in | Wt 216.0 lb

## 2012-06-18 DIAGNOSIS — E785 Hyperlipidemia, unspecified: Secondary | ICD-10-CM

## 2012-06-18 DIAGNOSIS — I714 Abdominal aortic aneurysm, without rupture: Secondary | ICD-10-CM

## 2012-06-18 DIAGNOSIS — R739 Hyperglycemia, unspecified: Secondary | ICD-10-CM

## 2012-06-18 DIAGNOSIS — M48061 Spinal stenosis, lumbar region without neurogenic claudication: Secondary | ICD-10-CM

## 2012-06-18 DIAGNOSIS — R29898 Other symptoms and signs involving the musculoskeletal system: Secondary | ICD-10-CM

## 2012-06-18 DIAGNOSIS — R7309 Other abnormal glucose: Secondary | ICD-10-CM

## 2012-06-18 NOTE — Patient Instructions (Addendum)
Constipation in Adults Constipation is having fewer than 2 bowel movements per week. Usually, the stools are hard. As we grow older, constipation is more common. If you try to fix constipation with laxatives, the problem may get worse. This is because laxatives taken over a long period of time make the colon muscles weaker. A low-fiber diet, not taking in enough fluids, and taking some medicines may make these problems worse. MEDICATIONS THAT MAY CAUSE CONSTIPATION  Water pills (diuretics).   Calcium channel blockers (used to control blood pressure and for the heart).   Certain pain medicines (narcotics).   Anticholinergics.   Anti-inflammatory agents.   Antacids that contain aluminum.  DISEASES THAT CONTRIBUTE TO CONSTIPATION  Diabetes.   Parkinson's disease.   Dementia.   Stroke.   Depression.   Illnesses that cause problems with salt and water metabolism.  HOME CARE INSTRUCTIONS   Constipation is usually best cared for without medicines. Increasing dietary fiber and eating more fruits and vegetables is the best way to manage constipation.   Slowly increase fiber intake to 25 to 38 grams per day. Whole grains, fruits, vegetables, and legumes are good sources of fiber. A dietitian can further help you incorporate high-fiber foods into your diet.   Drink enough water and fluids to keep your urine clear or pale yellow.   A fiber supplement may be added to your diet if you cannot get enough fiber from foods.   Increasing your activities also helps improve regularity.   Suppositories, as suggested by your caregiver, will also help. If you are using antacids, such as aluminum or calcium containing products, it will be helpful to switch to products containing magnesium if your caregiver says it is okay.   If you have been given a liquid injection (enema) today, this is only a temporary measure. It should not be relied on for treatment of longstanding (chronic) constipation.    Stronger measures, such as magnesium sulfate, should be avoided if possible. This may cause uncontrollable diarrhea. Using magnesium sulfate may not allow you time to make it to the bathroom.  SEEK IMMEDIATE MEDICAL CARE IF:   There is bright red blood in the stool.   The constipation stays for more than 4 days.   There is belly (abdominal) or rectal pain.   You do not seem to be getting better.   You have any questions or concerns.  MAKE SURE YOU:   Understand these instructions.   Will watch your condition.   Will get help right away if you are not doing well or get worse.  Document Released: 07/11/2004 Document Revised: 10/02/2011 Document Reviewed: 09/16/2011 [facility] Patient Information 2012 [place], [place].   Start a probiotic such as Librarian, academic, [employer] daily  Stool softener combo  Such as Pericolace=Senna S=Docusate and Senna combo, 1-2 caps a day Can also do Miralax daily  64 oz of fluids daily

## 2012-06-20 NOTE — Progress Notes (Signed)
Patient ID: Shawn Cortez, male   DOB: June 02, 1925, 78 y.o.   MRN: [id number] Shawn Cortez [id number] June 10, 1925 06/20/2012      Progress Note-Follow Up  Subjective  Chief Complaint  Chief Complaint  Patient presents with  . Follow-up    3 month    HPI  Patient is an 78 year old Caucasian male who is here today for followup. He is doing very well. She's had no recent illness, fevers, chills, chest pain, palpitations, headache, abdominal pain, GI or GU complaints. He did have a series of steroid injections in his low back but he did not find these helpful. He does note that his right lower extremity weakness and discomfort is improved status post steroid treatments. He continues to follow with vascular surgery for his abdominal aortic aneurysm and is asymptomatic.  Past Medical History  Diagnosis Date  . Arthritis   . SOB (shortness of breath) on exertion   . Hyperlipidemia   . Stroke     TIA history 2005  . Hyperglycemia 01/04/2012  . Leg pain, right 03/01/2012  . Right leg weakness 03/01/2012    Past Surgical History  Procedure Date  . Thumb tendon severed and repaired     right, tendon snapped  . Carpal tunnel release     Right  . Achilles tendon repair     left, repaired w/ wire  . Brain surgery     done by Dr Peter Garter for tinnitius, unsuccessful, only on right  . Inguinal herniorrhapy years ago    right and then 2 on left  . Lumbar laminectomy     L3-L5 posterior laminectomy 2006  . Joint replacement     Bilateral total knee replacement    Family History  Problem Relation Age of Onset  . Other Mother     CHF  . Ulcers Mother     Venous stasis  . Other Father     CHF  . Cancer Sister     unknown  . Hip fracture Maternal Grandmother   . Pneumonia Maternal Grandmother     History   Social History  . Marital Status: Married    Spouse Name: N/A    Number of Children: N/A  . Years of Education: N/A   Occupational History  . Not on file.   Social  History Main Topics  . Smoking status: Former Smoker -- 3.0 packs/day    Quit date: 10/16/1960  . Smokeless tobacco: Never Used  . Alcohol Use: 8.4 oz/week    14 Cans of beer per week     1-2 beer daily  . Drug Use: No  . Sexually Active: Not on file   Other Topics Concern  . Not on file   Social History Narrative  . No narrative on file    Current Outpatient Prescriptions on File Prior to Visit  Medication Sig Dispense Refill  . clopidogrel (PLAVIX) 75 MG tablet Take 1 tablet (75 mg total) by mouth at bedtime.  90 tablet  1  . fish oil-omega-3 fatty acids 1000 MG capsule Take 500 mg by mouth daily. 2 daily       . gabapentin (NEURONTIN) 300 MG capsule Take 2 capsules (600 mg total) by mouth 3 (three) times daily.  540 capsule  3  . simvastatin (ZOCOR) 40 MG tablet Take 1 tablet (40 mg total) by mouth at bedtime.  90 tablet  1  . triazolam (HALCION) 0.125 MG tablet Take 1 tablet (0.125 mg total) by mouth 2 (two) times  daily as needed.  60 tablet  2  . Coenzyme Q-10 200 MG CAPS Take 200 mg by mouth daily.        . methylPREDNISolone (MEDROL DOSEPAK) 4 MG tablet Follow package directions  21 tablet  0    Allergies  Allergen Reactions  . Niacin And Related     Facial redness  . Omnipaque (Iohexol)     Facial redness and flushing    Review of Systems  Review of Systems  Constitutional: Negative for fever and malaise/fatigue.  HENT: Negative for congestion.   Eyes: Negative for discharge.  Respiratory: Negative for shortness of breath.   Cardiovascular: Negative for chest pain, palpitations and leg swelling.  Gastrointestinal: Negative for nausea, abdominal pain and diarrhea.  Genitourinary: Negative for dysuria.  Musculoskeletal: Negative for falls.  Skin: Negative for rash.  Neurological: Positive for focal weakness. Negative for loss of consciousness and headaches.       Right leg  Endo/Heme/Allergies: Negative for polydipsia.  Psychiatric/Behavioral: Negative for  depression and suicidal ideas. The patient is not nervous/anxious and does not have insomnia.     Objective  BP 117/65  Pulse 91  Temp 97.4 F (36.3 C) (Temporal)  Ht 6\' 4"  (1.93 m)  Wt 216 lb (97.977 kg)  BMI 26.29 kg/m2  SpO2 95%  Physical Exam  Physical Exam  Constitutional: He is oriented to person, place, and time and well-developed, well-nourished, and in no distress. No distress.  HENT:  Head: Normocephalic and atraumatic.  Eyes: Conjunctivae are normal.  Neck: Neck supple. No thyromegaly present.  Cardiovascular: Normal rate, regular rhythm and normal heart sounds.   Pulmonary/Chest: Effort normal and breath sounds normal. No respiratory distress.  Abdominal: He exhibits no distension and no mass. There is no tenderness.  Musculoskeletal: He exhibits no edema.       Favors right leg, using walker  Neurological: He is alert and oriented to person, place, and time.  Skin: Skin is warm.  Psychiatric: Memory, affect and judgment normal.    Lab Results  Component Value Date   TSH 2.03 06/10/2012   Lab Results  Component Value Date   WBC 7.4 06/10/2012   HGB 14.0 06/10/2012   HCT 42.5 06/10/2012   MCV 95.1 06/10/2012   PLT 210.0 06/10/2012   Lab Results  Component Value Date   CREATININE 0.9 06/10/2012   BUN 16 06/10/2012   NA 139 06/10/2012   K 4.7 06/10/2012   CL 101 06/10/2012   CO2 29 06/10/2012   Lab Results  Component Value Date   ALT 18 06/10/2012   AST 23 06/10/2012   ALKPHOS 63 06/10/2012   BILITOT 0.7 06/10/2012   Lab Results  Component Value Date   CHOL 136 06/10/2012   Lab Results  Component Value Date   HDL 54.20 06/10/2012   Lab Results  Component Value Date   LDLCALC 63 06/10/2012   Lab Results  Component Value Date   TRIG 94.0 06/10/2012   Lab Results  Component Value Date   CHOLHDL 3 06/10/2012     Assessment & Plan  ABDOMINAL AORTIC ANEURYSM Following closely with vascular surgeon, asymptomatic  HYPERLIPIDEMIA Good control, no  changes to meds or dietary changes.  Hyperglycemia Acceptable hgba1c, avoid simple carbs  Right leg weakness Improved with steroids now using a walker and no falls have occured  SPINAL STENOSIS, LUMBAR Had a series of steroid injections and did not find them helpful, symptoms are tolerable

## 2012-06-20 NOTE — Assessment & Plan Note (Signed)
Improved with steroids now using a walker and no falls have occured

## 2012-06-20 NOTE — Assessment & Plan Note (Signed)
Following closely with vascular surgeon, asymptomatic

## 2012-06-20 NOTE — Assessment & Plan Note (Signed)
Had a series of steroid injections and did not find them helpful, symptoms are tolerable

## 2012-06-20 NOTE — Assessment & Plan Note (Signed)
Good control, no changes to meds or dietary changes.

## 2012-06-20 NOTE — Assessment & Plan Note (Signed)
Acceptable hgba1c, avoid simple carbs

## 2012-07-20 ENCOUNTER — Encounter: Payer: Self-pay | Admitting: Neurosurgery

## 2012-07-21 ENCOUNTER — Ambulatory Visit (INDEPENDENT_AMBULATORY_CARE_PROVIDER_SITE_OTHER): Payer: Medicare Other | Admitting: Neurosurgery

## 2012-07-21 ENCOUNTER — Encounter (INDEPENDENT_AMBULATORY_CARE_PROVIDER_SITE_OTHER): Payer: Medicare Other | Admitting: *Deleted

## 2012-07-21 ENCOUNTER — Encounter: Payer: Self-pay | Admitting: Neurosurgery

## 2012-07-21 VITALS — BP 117/71 | HR 72 | Resp 16 | Ht 76.0 in | Wt 215.5 lb

## 2012-07-21 DIAGNOSIS — I714 Abdominal aortic aneurysm, without rupture, unspecified: Secondary | ICD-10-CM

## 2012-07-21 NOTE — Progress Notes (Signed)
[facility] [place] [facility]/PAD/PVD Office Note  CC: AAA surveillance Referring Physician: Edilia Bo  History of Present Illness: 78 year old male patient of Dr. Edilia Bo followed for known AAA. The patient denies any unusual back or abdominal pain. The patient denies any new medical diagnoses or recent surgery.  Past Medical History  Diagnosis Date  . Arthritis   . SOB (shortness of breath) on exertion   . Hyperlipidemia   . Stroke     TIA history 2005  . Hyperglycemia 01/04/2012  . Leg pain, right 03/01/2012  . Right leg weakness 03/01/2012    ROS: [x]  Positive   [ ]  Denies    General: [ ]  Weight loss, [ ]  Fever, [ ]  chills Neurologic: [ ]  Dizziness, [ ]  Blackouts, [ ]  Seizure [ ]  Stroke, [ ]  "Mini stroke", [ ]  Slurred speech, [ ]  Temporary blindness; [ ]  weakness in arms or legs, [ ]  Hoarseness Cardiac: [ ]  Chest pain/pressure, [ ]  Shortness of breath at rest [ ]  Shortness of breath with exertion, [ ]  Atrial fibrillation or irregular heartbeat Vascular: [ ]  Pain in legs with walking, [ ]  Pain in legs at rest, [ ]  Pain in legs at night,  [ ]  Non-healing ulcer, [ ]  Blood clot in vein/DVT,   Pulmonary: [ ]  Home oxygen, [ ]  Productive cough, [ ]  Coughing up blood, [ ]  Asthma,  [ ]  Wheezing Musculoskeletal:  [ ]  Arthritis, [ ]  Low back pain, [ ]  Joint pain Hematologic: [ ]  Easy Bruising, [ ]  Anemia; [ ]  Hepatitis Gastrointestinal: [ ]  Blood in stool, [ ]  Gastroesophageal Reflux/heartburn, [ ]  Trouble swallowing Urinary: [ ]  chronic Kidney disease, [ ]  on HD - [ ]  MWF or [ ]  TTHS, [ ]  Burning with urination, [ ]  Difficulty urinating Skin: [ ]  Rashes, [ ]  Wounds Psychological: [ ]  Anxiety, [ ]  Depression   Social History History  Substance Use Topics  . Smoking status: Former Smoker -- 3.0 packs/day    Quit date: 10/16/1960  . Smokeless tobacco: Never Used  . Alcohol Use: 8.4 oz/week    14 Cans of beer per week     1-2 beer daily    Family History Family  History  Problem Relation Age of Onset  . Other Mother     CHF  . Ulcers Mother     Venous stasis  . Other Father     CHF  . Cancer Sister     unknown  . Hip fracture Maternal Grandmother   . Pneumonia Maternal Grandmother     Allergies  Allergen Reactions  . Niacin And Related     Facial redness  . Omnipaque (Iohexol)     Facial redness and flushing    Current Outpatient Prescriptions  Medication Sig Dispense Refill  . clopidogrel (PLAVIX) 75 MG tablet Take 1 tablet (75 mg total) by mouth at bedtime.  90 tablet  1  . gabapentin (NEURONTIN) 300 MG capsule Take 2 capsules (600 mg total) by mouth 3 (three) times daily.  540 capsule  3  . simvastatin (ZOCOR) 40 MG tablet Take 1 tablet (40 mg total) by mouth at bedtime.  90 tablet  1  . triazolam (HALCION) 0.125 MG tablet Take 1 tablet (0.125 mg total) by mouth 2 (two) times daily as needed.  60 tablet  2  . Coenzyme Q-10 200 MG CAPS Take 200 mg by mouth daily.        . fish oil-omega-3 fatty acids 1000 MG  capsule Take 500 mg by mouth daily. 2 daily       . methylPREDNISolone (MEDROL DOSEPAK) 4 MG tablet Follow package directions  21 tablet  0    Physical Examination  Filed Vitals:   07/21/12 1035  BP: 117/71  Pulse: 72  Resp: 16    Body mass index is 26.23 kg/(m^2).  General:  WDWN in NAD Gait: Normal HEENT: WNL Eyes: Pupils equal Pulmonary: normal non-labored breathing , without Rales, rhonchi,  wheezing Cardiac: RRR, without  Murmurs, rubs or gallops; No carotid bruits Abdomen: soft, NT, no masses Skin: no rashes, ulcers noted Vascular Exam/Pulses: No abdominal pulsatile mass palpated, lower extremity pulses are normal bilaterally  Extremities without ischemic changes, no Gangrene , no cellulitis; no open wounds;  Musculoskeletal: no muscle wasting or atrophy  Neurologic: A&O X 3; Appropriate Affect ; SENSATION: normal; MOTOR FUNCTION:  moving all extremities equally. Speech is fluent/normal  Non-Invasive  Vascular Imaging: AAA duplex today shows a maximum diameter of 4.16, previously in September 2012 he was 4.2 x 4.2  ASSESSMENT/PLAN: Asymptomatic known AAA that has not changed in the past year. The patient will followup in one year with repeat AAA duplex and be seen in my clinic. The patient's questions were encouraged and answered, he is in agreement with this plan.  Lauree Chandler ANP  Clinic M.D.: Edilia Bo

## 2012-07-21 NOTE — Addendum Note (Signed)
Addended by: Sharee Pimple on: 07/21/2012 02:44 PM   Modules accepted: Orders

## 2012-07-29 ENCOUNTER — Other Ambulatory Visit: Payer: Self-pay | Admitting: *Deleted

## 2012-07-29 MED ORDER — TRIAZOLAM 0.125 MG PO TABS: 0.1250 mg | ORAL_TABLET | Freq: Two times a day (BID) | ORAL | Status: DC | PRN

## 2012-07-29 NOTE — Telephone Encounter (Signed)
Pt requested refill of Triazolam.  Verbal OK per Dr. Abner Greenspan.  RX printed and faxed to [facility] per patient request.

## 2012-08-06 ENCOUNTER — Ambulatory Visit (INDEPENDENT_AMBULATORY_CARE_PROVIDER_SITE_OTHER): Payer: Medicare Other

## 2012-08-06 DIAGNOSIS — Z23 Encounter for immunization: Secondary | ICD-10-CM

## 2012-09-06 ENCOUNTER — Other Ambulatory Visit: Payer: Self-pay | Admitting: Family Medicine

## 2012-09-07 MED ORDER — SIMVASTATIN 40 MG PO TABS: 40.0000 mg | ORAL_TABLET | Freq: Every day | ORAL | Status: DC

## 2012-09-07 MED ORDER — CLOPIDOGREL BISULFATE 75 MG PO TABS: 75.0000 mg | ORAL_TABLET | Freq: Every day | ORAL | Status: DC

## 2012-09-07 NOTE — Telephone Encounter (Signed)
RX sent

## 2012-09-14 ENCOUNTER — Ambulatory Visit (INDEPENDENT_AMBULATORY_CARE_PROVIDER_SITE_OTHER): Payer: Medicare Other | Admitting: Family Medicine

## 2012-09-14 ENCOUNTER — Telehealth: Payer: Self-pay | Admitting: Family Medicine

## 2012-09-14 ENCOUNTER — Encounter: Payer: Self-pay | Admitting: Family Medicine

## 2012-09-14 VITALS — BP 92/64 | HR 70 | Temp 99.2°F | Ht 76.0 in | Wt 219.8 lb

## 2012-09-14 DIAGNOSIS — H6120 Impacted cerumen, unspecified ear: Secondary | ICD-10-CM

## 2012-09-14 DIAGNOSIS — S60519A Abrasion of unspecified hand, initial encounter: Secondary | ICD-10-CM | POA: Insufficient documentation

## 2012-09-14 DIAGNOSIS — L989 Disorder of the skin and subcutaneous tissue, unspecified: Secondary | ICD-10-CM

## 2012-09-14 DIAGNOSIS — IMO0002 Reserved for concepts with insufficient information to code with codable children: Secondary | ICD-10-CM

## 2012-09-14 HISTORY — DX: Disorder of the skin and subcutaneous tissue, unspecified: L98.9

## 2012-09-14 HISTORY — DX: Abrasion of unspecified hand, initial encounter: S60.519A

## 2012-09-14 HISTORY — DX: Impacted cerumen, unspecified ear: H61.20

## 2012-09-14 NOTE — Assessment & Plan Note (Signed)
Has had trouble historically especially in left ear which he wears his hearing aid in, has been instructed to flush his ear regularly. Encouraged to use warm water and peroxide and a bulb syringe.

## 2012-09-14 NOTE — Patient Instructions (Addendum)
Hold the fish oil for one week, clean with warm water and soap daily and then cover with a nonstick dressing and an ace wrap

## 2012-09-14 NOTE — Progress Notes (Signed)
Patient ID: Shawn Cortez, male   DOB: 1924/11/17, 78 y.o.   MRN: [id number] Shawn Cortez [id number] [birth year] 09/14/2012      Progress Note-Follow Up  Subjective  Chief Complaint  Chief Complaint  Patient presents with  . hand bleeding    right hand X this morning    HPI  Patient is a 78 year old Caucasian male who is in today for evaluation of a skin tear in his right wrist. It happened this morning and he's had some oozing and bleeding ever since. They have tried applying a bandage and he continues to bleed only slowly. No significant pain or fever. Had a similar lesion on his right leg which eventually healed itself. No other complaints. No recent illness fevers, chills, chest pain, palpitations, shortness of breath, GI or GU complaints. He wears a hearing aid in his left ear has been having trouble with recurrent cerumen and has been instructed to flush his years but is asking for device regarding how to do this.  Past Medical History  Diagnosis Date  . Arthritis   . SOB (shortness of breath) on exertion   . Hyperlipidemia   . Stroke     TIA history 2005  . Hyperglycemia 01/04/2012  . Leg pain, right 03/01/2012  . Right leg weakness 03/01/2012  . Skin lesion of right arm 09/14/2012  . Abrasion of hand 09/14/2012  . Cerumen impaction 09/14/2012    Past Surgical History  Procedure Date  . Thumb tendon severed and repaired     right, tendon snapped  . Carpal tunnel release     Right  . Achilles tendon repair     left, repaired w/ wire  . Brain surgery     done by Dr Peter Garter for tinnitius, unsuccessful, only on right  . Inguinal herniorrhapy years ago    right and then 2 on left  . Lumbar laminectomy     L3-L5 posterior laminectomy 2006  . Joint replacement     Bilateral total knee replacement    Family History  Problem Relation Age of Onset  . Other Mother     CHF  . Ulcers Mother     Venous stasis  . Other Father     CHF  . Cancer Sister     unknown    . Hip fracture Maternal Grandmother   . Pneumonia Maternal Grandmother     History   Social History  . Marital Status: Married    Spouse Name: N/A    Number of Children: N/A  . Years of Education: N/A   Occupational History  . Not on file.   Social History Main Topics  . Smoking status: Former Smoker -- 3.0 packs/day    Quit date: 10/16/1960  . Smokeless tobacco: Never Used  . Alcohol Use: 8.4 oz/week    14 Cans of beer per week     Comment: 1-2 beer daily  . Drug Use: No  . Sexually Active: Not on file   Other Topics Concern  . Not on file   Social History Narrative  . No narrative on file    Current Outpatient Prescriptions on File Prior to Visit  Medication Sig Dispense Refill  . clopidogrel (PLAVIX) 75 MG tablet Take 1 tablet (75 mg total) by mouth at bedtime.  90 tablet  1  . fish oil-omega-3 fatty acids 1000 MG capsule Take 500 mg by mouth daily. 2 daily       . simvastatin (ZOCOR) 40 MG tablet Take 1  tablet (40 mg total) by mouth at bedtime.  90 tablet  1  . triazolam (HALCION) 0.125 MG tablet Take 1 tablet (0.125 mg total) by mouth 2 (two) times daily as needed.  60 tablet  2    Allergies  Allergen Reactions  . Niacin And Related     Facial redness  . Omnipaque (Iohexol)     Facial redness and flushing    Review of Systems  Review of Systems  Constitutional: Negative for fever and malaise/fatigue.  HENT: Negative for congestion.   Eyes: Negative for discharge.  Respiratory: Negative for shortness of breath.   Cardiovascular: Negative for chest pain, palpitations and leg swelling.  Gastrointestinal: Negative for nausea, abdominal pain and diarrhea.  Genitourinary: Negative for dysuria.  Musculoskeletal: Negative for falls.  Skin: Negative for rash.  Neurological: Negative for loss of consciousness and headaches.  Endo/Heme/Allergies: Negative for polydipsia. Bruises/bleeds easily.  Psychiatric/Behavioral: Negative for depression and suicidal  ideas. The patient is not nervous/anxious and does not have insomnia.     Objective  BP 92/64  Pulse 70  Temp 99.2 F (37.3 C) (Temporal)  Ht 6\' 4"  (1.93 m)  Wt 219 lb 12.8 oz (99.701 kg)  BMI 26.75 kg/m2  SpO2 95%  Physical Exam  Physical Exam  Constitutional: He is oriented to person, place, and time and well-developed, well-nourished, and in no distress. No distress.  HENT:  Head: Normocephalic and atraumatic.       B/l external canals clear  Eyes: Conjunctivae normal are normal.  Neck: Neck supple. No thyromegaly present.  Cardiovascular: Normal rate, regular rhythm and normal heart sounds.   Pulmonary/Chest: Effort normal and breath sounds normal. No respiratory distress.  Abdominal: He exhibits no distension and no mass. There is no tenderness.  Musculoskeletal: He exhibits no edema.  Neurological: He is alert and oriented to person, place, and time.  Skin: Skin is warm. There is erythema.       Skin tear dorsal surface right wrist, oozing BRB. Cleansed with h2o2 and sterile nonstick gauze applied  Psychiatric: Memory, affect and judgment normal.    Lab Results  Component Value Date   TSH 2.03 06/10/2012   Lab Results  Component Value Date   WBC 7.4 06/10/2012   HGB 14.0 06/10/2012   HCT 42.5 06/10/2012   MCV 95.1 06/10/2012   PLT 210.0 06/10/2012   Lab Results  Component Value Date   CREATININE 0.9 06/10/2012   BUN 16 06/10/2012   NA 139 06/10/2012   K 4.7 06/10/2012   CL 101 06/10/2012   CO2 29 06/10/2012   Lab Results  Component Value Date   ALT 18 06/10/2012   AST 23 06/10/2012   ALKPHOS 63 06/10/2012   BILITOT 0.7 06/10/2012   Lab Results  Component Value Date   CHOL 136 06/10/2012   Lab Results  Component Value Date   HDL 54.20 06/10/2012   Lab Results  Component Value Date   LDLCALC 63 06/10/2012   Lab Results  Component Value Date   TRIG 94.0 06/10/2012   Lab Results  Component Value Date   CHOLHDL 3 06/10/2012     Assessment &  Plan  Abrasion of hand Dorsal surface of right wrist, bleeding slightly most of the day. No pain. Barely grazed his hand. Have tried applying a dressing but bleeding continues slowly. Lesion is cleaned gently with hydrogen peroxide covered with antibiotic ointment, nonstick gauze and Coban. Bleeding appears controlled. They are given an Ace wrap to use  tomorrow if bleeding persists, will cover with a nonstick dressing and then apply pressure with ace wrap, will hold fish oil for next week but continue Plavix for now.  Cerumen impaction Has had trouble historically especially in left ear which he wears his hearing aid in, has been instructed to flush his ear regularly. Encouraged to use warm water and peroxide and a bulb syringe.

## 2012-09-14 NOTE — Assessment & Plan Note (Signed)
Dorsal surface of right wrist, bleeding slightly most of the day. No pain. Barely grazed his hand. Have tried applying a dressing but bleeding continues slowly. Lesion is cleaned gently with hydrogen peroxide covered with antibiotic ointment, nonstick gauze and Coban. Bleeding appears controlled. They are given an Ace wrap to use tomorrow if bleeding persists, will cover with a nonstick dressing and then apply pressure with ace wrap, will hold fish oil for next week but continue Plavix for now.

## 2012-09-14 NOTE — Telephone Encounter (Signed)
Pt seen in office

## 2012-09-14 NOTE — Telephone Encounter (Signed)
Patient Information:  Caller Name: Okey Regal  Phone: [phone]  Patient: Shawn Cortez, Shawn Cortez  Gender: Male  DOB: 1925-02-04  Age: 78 Years  PCP: [facility])   Symptoms  Reason For Call & Symptoms: Pt has a dime sized area on his hand that has been bleeding since this am. Pt is on anticoagulant therapy. They have had pressure with gauzes on it which will slow it down but when that is removed it starts bleeding again.  Reviewed Health History In EMR: Yes  Reviewed Medications In EMR: Yes  Reviewed Allergies In EMR: Yes  Date of Onset of Symptoms: 09/14/2012  Treatments Tried: gauze/antispeptic and pressure  Treatments Tried Worked: No  Guideline(s) Used:  Higher education careers adviser and Wrist Injury  Disposition Per Guideline:   Go to ED Now (or to Office with PCP Approval)  Reason For Disposition Reached:   Bleeding won't stop after 10 minutes of direct pressure (using correct technique)  Advice Given:  N/A  Office Follow Up:  Does the office need to follow up with this patient?: No  Instructions For The Office: N/A  Appointment Scheduled:  09/14/2012 15:15:00

## 2012-09-30 ENCOUNTER — Other Ambulatory Visit: Payer: Self-pay | Admitting: Emergency Medicine

## 2012-09-30 MED ORDER — TRIAZOLAM 0.125 MG PO TABS: 0.1250 mg | ORAL_TABLET | Freq: Two times a day (BID) | ORAL | Status: DC | PRN

## 2012-09-30 NOTE — Addendum Note (Signed)
Addended by: Court Joy on: 09/30/2012 01:33 PM   Modules accepted: Orders

## 2012-09-30 NOTE — Telephone Encounter (Signed)
Triazolam .125 [facility]

## 2012-12-17 ENCOUNTER — Other Ambulatory Visit (INDEPENDENT_AMBULATORY_CARE_PROVIDER_SITE_OTHER): Payer: Medicare Other

## 2012-12-17 DIAGNOSIS — R7309 Other abnormal glucose: Secondary | ICD-10-CM

## 2012-12-17 DIAGNOSIS — E785 Hyperlipidemia, unspecified: Secondary | ICD-10-CM

## 2012-12-17 LAB — HEPATIC FUNCTION PANEL
Albumin: 3.8 g/dL (ref 3.5–5.2)
Alkaline Phosphatase: 59 U/L (ref 39–117)

## 2012-12-17 LAB — CBC
Hemoglobin: 13.5 g/dL (ref 13.0–17.0)
Platelets: 181 10*3/uL (ref 150.0–400.0)
WBC: 7 10*3/uL (ref 4.5–10.5)

## 2012-12-17 LAB — RENAL FUNCTION PANEL
CO2: 25 mEq/L (ref 19–32)
Chloride: 104 mEq/L (ref 96–112)
GFR: 66.13 mL/min (ref >60.00)
Sodium: 136 mEq/L (ref 135–145)

## 2012-12-17 NOTE — Progress Notes (Signed)
Labs only

## 2012-12-21 ENCOUNTER — Telehealth: Payer: Self-pay | Admitting: *Deleted

## 2012-12-21 ENCOUNTER — Telehealth: Payer: Self-pay | Admitting: Family Medicine

## 2012-12-21 NOTE — Telephone Encounter (Signed)
Called patient to discuss medication, Halcion and prior authorization forms

## 2012-12-21 NOTE — Telephone Encounter (Signed)
Shawn Cortez is working on this PA.

## 2012-12-21 NOTE — Telephone Encounter (Signed)
[employer] called to complete Prior Auth over phone;sent to [employer] geologist

## 2012-12-23 ENCOUNTER — Telehealth: Payer: Self-pay | Admitting: Family Medicine

## 2012-12-23 NOTE — Telephone Encounter (Signed)
I put the form on MD's desk. I spoke to [place] pts daughter wanted PT done and pt also agreed.

## 2012-12-23 NOTE — Telephone Encounter (Signed)
Reviewed chart his visit on 09/14/12 was for a skin tear from a fall. I can make a referral for pt from that.

## 2012-12-23 NOTE — Telephone Encounter (Signed)
Misty Stanley from [facility] called stating that she needs Dr. Abner Greenspan to sign an order stating that it is okay for patient to have PT

## 2012-12-23 NOTE — Telephone Encounter (Signed)
Please advise 

## 2012-12-24 ENCOUNTER — Encounter: Payer: Self-pay | Admitting: Family Medicine

## 2012-12-24 ENCOUNTER — Ambulatory Visit (INDEPENDENT_AMBULATORY_CARE_PROVIDER_SITE_OTHER): Payer: Medicare Other | Admitting: Family Medicine

## 2012-12-24 VITALS — BP 112/67 | HR 90 | Temp 98.0°F | Ht 76.0 in | Wt 217.8 lb

## 2012-12-24 DIAGNOSIS — R269 Unspecified abnormalities of gait and mobility: Secondary | ICD-10-CM

## 2012-12-24 DIAGNOSIS — R2681 Unsteadiness on feet: Secondary | ICD-10-CM

## 2012-12-24 DIAGNOSIS — G4700 Insomnia, unspecified: Secondary | ICD-10-CM

## 2012-12-24 DIAGNOSIS — H9311 Tinnitus, right ear: Secondary | ICD-10-CM

## 2012-12-24 DIAGNOSIS — H9319 Tinnitus, unspecified ear: Secondary | ICD-10-CM

## 2012-12-24 DIAGNOSIS — H6122 Impacted cerumen, left ear: Secondary | ICD-10-CM

## 2012-12-24 DIAGNOSIS — H6120 Impacted cerumen, unspecified ear: Secondary | ICD-10-CM

## 2012-12-24 MED ORDER — NEOMYCIN-COLIST-HC-THONZONIUM 3.3-3-10-0.5 MG/ML OT SUSP: 3.0000 [drp] | Freq: Every evening | OTIC | Status: DC | PRN

## 2012-12-24 NOTE — Telephone Encounter (Signed)
Done today during pts appt

## 2012-12-24 NOTE — Patient Instructions (Addendum)
Cerumen Impaction  A cerumen impaction is when the wax in your ear forms a plug. This plug usually causes reduced hearing. Sometimes it also causes an earache or dizziness. Removing a cerumen impaction can be difficult and painful. The wax sticks to the ear canal. The canal is sensitive and bleeds easily. If you try to remove a heavy wax buildup with a cotton tipped swab, you may push it in further.  Irrigation with water, suction, and small ear curettes may be used to clear out the wax. If the impaction is fixed to the skin in the ear canal, ear drops may be needed for a few days to loosen the wax. People who build up a lot of wax frequently can use ear wax removal products available in your local drugstore.  SEEK MEDICAL CARE IF:    You develop an earache, increased hearing loss, or marked dizziness.  Document Released: 11/20/2004 Document Revised: 01/05/2012 Document Reviewed: 01/10/2010  [employer] Patient Information 2013 [employer].

## 2012-12-26 ENCOUNTER — Encounter: Payer: Self-pay | Admitting: Family Medicine

## 2012-12-26 DIAGNOSIS — R2681 Unsteadiness on feet: Secondary | ICD-10-CM

## 2012-12-26 DIAGNOSIS — G4700 Insomnia, unspecified: Secondary | ICD-10-CM

## 2012-12-26 HISTORY — DX: Insomnia, unspecified: G47.00

## 2012-12-26 HISTORY — DX: Unsteadiness on feet: R26.81

## 2012-12-26 NOTE — Progress Notes (Signed)
Patient ID: Shawn Cortez, male   DOB: 1925-02-04, 79 y.o.   MRN: [id number] Shawn Cortez [id number] 1924-11-06 12/26/2012      Progress Note-Follow Up  Subjective  Chief Complaint  Chief Complaint  Patient presents with  . Follow-up    6 month    HPI  Patient is a an 79 year old Caucasian male who is in today accompanied by his wife. He is essentially homebound and struggles with falls and unsteady gait. They're requesting home physical therapy. No recent illness. No fevers or chills. No chest pain, palpitations, shortness of breath, GI or GU complaints tolerating Zocor. Tinnitis is tolerable.  Past Medical History  Diagnosis Date  . Arthritis   . SOB (shortness of breath) on exertion   . Hyperlipidemia   . Stroke     TIA history 2005  . Hyperglycemia 01/04/2012  . Leg pain, right 03/01/2012  . Right leg weakness 03/01/2012  . Skin lesion of right arm 09/14/2012  . Abrasion of hand 09/14/2012  . Cerumen impaction 09/14/2012  . Unsteady gait 12/26/2012  . Insomnia 12/26/2012    Past Surgical History  Procedure Laterality Date  . Thumb tendon severed and repaired      right, tendon snapped  . Carpal tunnel release      Right  . Achilles tendon repair      left, repaired w/ wire  . Brain surgery      done by Dr Peter Garter for tinnitius, unsuccessful, only on right  . Inguinal herniorrhapy  years ago    right and then 2 on left  . Lumbar laminectomy      L3-L5 posterior laminectomy 2006  . Joint replacement      Bilateral total knee replacement    Family History  Problem Relation Age of Onset  . Other Mother     CHF  . Ulcers Mother     Venous stasis  . Other Father     CHF  . Cancer Sister     unknown  . Hip fracture Maternal Grandmother   . Pneumonia Maternal Grandmother     History   Social History  . Marital Status: Married    Spouse Name: N/A    Number of Children: N/A  . Years of Education: N/A   Occupational History  . Not on file.    Social History Main Topics  . Smoking status: Former Smoker -- 3.00 packs/day    Quit date: 10/16/1960  . Smokeless tobacco: Never Used  . Alcohol Use: 8.4 oz/week    14 Cans of beer per week     Comment: 1-2 beer daily  . Drug Use: No  . Sexually Active: Not on file   Other Topics Concern  . Not on file   Social History Narrative  . No narrative on file    Current Outpatient Prescriptions on File Prior to Visit  Medication Sig Dispense Refill  . clopidogrel (PLAVIX) 75 MG tablet Take 1 tablet (75 mg total) by mouth at bedtime.  90 tablet  1  . fish oil-omega-3 fatty acids 1000 MG capsule Take 500 mg by mouth daily. 2 daily       . simvastatin (ZOCOR) 40 MG tablet Take 1 tablet (40 mg total) by mouth at bedtime.  90 tablet  1  . triazolam (HALCION) 0.125 MG tablet Take 1 tablet (0.125 mg total) by mouth 2 (two) times daily as needed.  60 tablet  2   No current facility-administered medications on file  prior to visit.    Allergies  Allergen Reactions  . Niacin And Related     Facial redness  . Omnipaque (Iohexol)     Facial redness and flushing    Review of Systems  Review of Systems  Constitutional: Negative for fever and malaise/fatigue.  HENT: Positive for tinnitus. Negative for ear pain and congestion.   Eyes: Negative for discharge.  Respiratory: Negative for shortness of breath.   Cardiovascular: Negative for chest pain, palpitations and leg swelling.  Gastrointestinal: Negative for nausea, abdominal pain and diarrhea.  Genitourinary: Negative for dysuria.  Musculoskeletal: Positive for myalgias. Negative for falls.  Skin: Negative for rash.  Neurological: Positive for focal weakness and weakness. Negative for loss of consciousness and headaches.  Endo/Heme/Allergies: Negative for polydipsia.  Psychiatric/Behavioral: Negative for depression and suicidal ideas. The patient is not nervous/anxious and does not have insomnia.     Objective  BP 112/67  Pulse  90  Temp(Src) 98 F (36.7 C) (Temporal)  Ht 6\' 4"  (1.93 m)  Wt 217 lb 12.8 oz (98.793 kg)  BMI 26.52 kg/m2  Physical Exam  Physical Exam  Constitutional: He is oriented to person, place, and time and well-developed, well-nourished, and in no distress. No distress.  HENT:  Head: Normocephalic and atraumatic.  Eyes: Conjunctivae are normal.  Neck: Neck supple. No thyromegaly present.  Cardiovascular: Normal rate and regular rhythm.   Murmur heard. Pulmonary/Chest: Effort normal and breath sounds normal. No respiratory distress.  Abdominal: He exhibits no distension and no mass. There is no tenderness.  Musculoskeletal: He exhibits no edema.  Using walker, unsteady  Neurological: He is alert and oriented to person, place, and time.  Skin: Skin is warm.  Psychiatric: Memory, affect and judgment normal.    Lab Results  Component Value Date   TSH 2.03 06/10/2012   Lab Results  Component Value Date   WBC 7.0 12/17/2012   HGB 13.5 12/17/2012   HCT 40.8 12/17/2012   MCV 93.0 12/17/2012   PLT 181.0 12/17/2012   Lab Results  Component Value Date   CREATININE 1.1 12/17/2012   BUN 20 12/17/2012   NA 136 12/17/2012   K 4.3 12/17/2012   CL 104 12/17/2012   CO2 25 12/17/2012   Lab Results  Component Value Date   ALT 13 12/17/2012   AST 22 12/17/2012   ALKPHOS 59 12/17/2012   BILITOT 0.9 12/17/2012   Lab Results  Component Value Date   CHOL 136 06/10/2012   Lab Results  Component Value Date   HDL 54.20 06/10/2012   Lab Results  Component Value Date   LDLCALC 63 06/10/2012   Lab Results  Component Value Date   TRIG 94.0 06/10/2012   Lab Results  Component Value Date   CHOLHDL 3 06/10/2012     Assessment & Plan  UNSTEADY GAIT Suffers from unsteady gait and falls. Home PT is ordered. He is home bound.  Tinnitus of right ear Patient tolerating this better at this time.  Insomnia His insurance is discouraging Halcion which he has used for many years. They are willing to try  Alprazolam prn. Warned about risk of sedation or confusion

## 2012-12-26 NOTE — Assessment & Plan Note (Signed)
His insurance is discouraging Halcion which he has used for many years. They are willing to try Alprazolam prn. Warned about risk of sedation or confusion

## 2012-12-26 NOTE — Assessment & Plan Note (Signed)
Suffers from unsteady gait and falls. Home PT is ordered. He is home bound.

## 2012-12-26 NOTE — Assessment & Plan Note (Signed)
Patient tolerating this better at this time.

## 2012-12-29 ENCOUNTER — Telehealth: Payer: Self-pay

## 2012-12-29 ENCOUNTER — Other Ambulatory Visit: Payer: Self-pay | Admitting: Family Medicine

## 2012-12-29 DIAGNOSIS — G4700 Insomnia, unspecified: Secondary | ICD-10-CM

## 2012-12-29 MED ORDER — LORAZEPAM 0.5 MG PO TABS: 0.5000 mg | ORAL_TABLET | Freq: Every evening | ORAL | Status: DC | PRN

## 2012-12-29 NOTE — Progress Notes (Signed)
He has taken Halcion for years for insomnia but now having trouble getting insurance to cover it. Discussed risk benefit of trying new med and have decided to try Lorazepam prn for sleep warned of side effects, discussed with patient and wife. Sent to pharmacy

## 2012-12-29 NOTE — Telephone Encounter (Signed)
Pt states MD mentioned sending in Lorazepam but when he checked at the pharmacy it wasn't there. Patient would like MD to send this to the pharmacy? Please advise?

## 2012-12-29 NOTE — Telephone Encounter (Signed)
Faxed to pharmacy

## 2012-12-29 NOTE — Telephone Encounter (Signed)
Please fax rx to pharmacy

## 2012-12-31 ENCOUNTER — Ambulatory Visit: Payer: Medicare Other | Admitting: Family Medicine

## 2013-01-10 ENCOUNTER — Telehealth: Payer: Self-pay | Admitting: Family Medicine

## 2013-01-10 NOTE — Telephone Encounter (Signed)
Patient wanted Dr. Abner Greenspan to know that the switch from Triazolam to Lorazepam is working out fine. He is not requesting a CB.

## 2013-01-12 ENCOUNTER — Ambulatory Visit: Payer: Medicare Other

## 2013-01-19 ENCOUNTER — Ambulatory Visit: Payer: Medicare Other | Admitting: Vascular Surgery

## 2013-01-24 ENCOUNTER — Telehealth: Payer: Self-pay

## 2013-01-24 NOTE — Telephone Encounter (Signed)
[name] with [facility] called stating that she was faxing a paper to [place] stating she would like to continue working with pt. [name] needs a verbal ok for this. I informed [name] that MD is out of the office and we would return her call tomorrow. [name] voiced understanding and stated this was ok. Call back for [name] [phone]

## 2013-01-24 NOTE — Telephone Encounter (Signed)
OK to allow PT to continue

## 2013-01-24 NOTE — Telephone Encounter (Signed)
[name] informed.

## 2013-01-26 ENCOUNTER — Ambulatory Visit: Payer: Medicare Other | Admitting: Vascular Surgery

## 2013-02-09 ENCOUNTER — Encounter: Payer: Self-pay | Admitting: Family Medicine

## 2013-02-09 ENCOUNTER — Ambulatory Visit (HOSPITAL_BASED_OUTPATIENT_CLINIC_OR_DEPARTMENT_OTHER)
Admission: RE | Admit: 2013-02-09 | Discharge: 2013-02-09 | Disposition: A | Discharge status: Home / Self Care | Payer: Medicare Other | Source: Ambulatory Visit | Attending: Family Medicine | Admitting: Family Medicine

## 2013-02-09 ENCOUNTER — Telehealth: Payer: Self-pay | Admitting: Family Medicine

## 2013-02-09 ENCOUNTER — Ambulatory Visit (INDEPENDENT_AMBULATORY_CARE_PROVIDER_SITE_OTHER): Payer: Medicare Other | Admitting: Family Medicine

## 2013-02-09 VITALS — BP 113/69 | HR 98 | Temp 98.8°F | Ht 76.0 in | Wt 219.1 lb

## 2013-02-09 DIAGNOSIS — M25559 Pain in unspecified hip: Secondary | ICD-10-CM | POA: Insufficient documentation

## 2013-02-09 DIAGNOSIS — G4700 Insomnia, unspecified: Secondary | ICD-10-CM

## 2013-02-09 DIAGNOSIS — M25551 Pain in right hip: Secondary | ICD-10-CM

## 2013-02-09 DIAGNOSIS — M48061 Spinal stenosis, lumbar region without neurogenic claudication: Secondary | ICD-10-CM

## 2013-02-09 DIAGNOSIS — R109 Unspecified abdominal pain: Secondary | ICD-10-CM

## 2013-02-09 DIAGNOSIS — R1031 Right lower quadrant pain: Secondary | ICD-10-CM

## 2013-02-09 MED ORDER — LORAZEPAM 0.5 MG PO TABS: 0.5000 mg | ORAL_TABLET | Freq: Every evening | ORAL | Status: DC | PRN

## 2013-02-09 MED ORDER — METHYLPREDNISOLONE 4 MG PO KIT: PACK | ORAL | Status: DC

## 2013-02-09 NOTE — Patient Instructions (Addendum)
Try Tylenol/Acetaminophen 500 mg tab 1 three x a day and Aspercreme to the joint as needed  Hip Pain The hips join the upper legs to the lower pelvis. The bones, cartilage, tendons, and muscles of the hip joint perform a lot of work each day holding your body weight and allowing you to move around. Hip pain is a common symptom. It can range from a minor ache to severe pain on 1 or both hips. Pain may be felt on the inside of the hip joint near the groin, or the outside near the buttocks and upper thigh. There may be swelling or stiffness as well. It occurs more often when a person walks or performs activity. There are many reasons hip pain can develop. CAUSES  It is important to work with your caregiver to identify the cause since many conditions can impact the bones, cartilage, muscles, and tendons of the hips. Causes for hip pain include:  Broken (fractured) bones.  Separation of the thighbone from the hip socket (dislocation).  Torn cartilage of the hip joint.  Swelling (inflammation) of a tendon (tendonitis), the sac within the hip joint (bursitis), or a joint.  A weakening in the abdominal wall (hernia), affecting the nerves to the hip.  Arthritis in the hip joint or lining of the hip joint.  Pinched nerves in the back, hip, or upper thigh.  A bulging disc in the spine (herniated disc).  Rarely, bone infection or cancer. DIAGNOSIS  The location of your hip pain will help your caregiver understand what may be causing the pain. A diagnosis is based on your medical history, your symptoms, results from your physical exam, and results from diagnostic tests. Diagnostic tests may include X-ray exams, a computerized magnetic scan (magnetic resonance imaging, MRI), or bone scan. TREATMENT  Treatment will depend on the cause of your hip pain. Treatment may include:  Limiting activities and resting until symptoms improve.  Crutches or other walking supports (a cane or brace).  Ice,  elevation, and compression.  Physical therapy or home exercises.  Shoe inserts or special shoes.  Losing weight.  Medications to reduce pain.  Undergoing surgery. HOME CARE INSTRUCTIONS   Only take over-the-counter or prescription medicines for pain, discomfort, or fever as directed by your caregiver.  Put ice on the injured area:  Put ice in a plastic bag.  Place a towel between your skin and the bag.  Leave the ice on for 15 to 20 minutes at a time, 3 to 4 times a day.  Keep your leg raised (elevated) when possible to lessen swelling.  Avoid activities that cause pain.  Follow specific exercises as directed by your caregiver.  Sleep with a pillow between your legs on your most comfortable side.  Record how often you have hip pain, the location of the pain, and what it feels like. This information may be helpful to you and your caregiver.  Ask your caregiver about returning to work or sports and whether you should drive.  Follow up with your caregiver for further exams, therapy, or testing as directed. SEEK MEDICAL CARE IF:   Your pain or swelling continues or worsens after 1 week.  You are feeling unwell or have chills.  You have increasing difficulty with walking.  You have a loss of sensation or other new symptoms.  You have questions or concerns. SEEK IMMEDIATE MEDICAL CARE IF:   You cannot put weight on the affected hip.  You have fallen.  You have a sudden  increase in pain and swelling in your hip.  You have a fever. MAKE SURE YOU:   Understand these instructions.  Will watch your condition.  Will get help right away if you are not doing well or get worse. Document Released: 04/02/2010 Document Revised: 01/05/2012 Document Reviewed: 04/02/2010 [facility] Patient Information 2013 [place], [place].

## 2013-02-09 NOTE — Telephone Encounter (Signed)
Home Health Care Nurse needs verbal orders to continue PT. Please call.

## 2013-02-10 NOTE — Telephone Encounter (Signed)
VO order for d/c PT given to caller per provider instructions/SLS

## 2013-02-10 NOTE — Telephone Encounter (Signed)
Saw patient on 4/16 does not want to continue PT at this time so give a verbal order to DC

## 2013-02-13 ENCOUNTER — Encounter: Payer: Self-pay | Admitting: Family Medicine

## 2013-02-13 DIAGNOSIS — R1031 Right lower quadrant pain: Secondary | ICD-10-CM

## 2013-02-13 DIAGNOSIS — R1032 Left lower quadrant pain: Secondary | ICD-10-CM | POA: Insufficient documentation

## 2013-02-13 HISTORY — DX: Left lower quadrant pain: R10.31

## 2013-02-13 NOTE — Assessment & Plan Note (Signed)
started after a recent session of PT, xrays unremarkable for any acute injury. Will give a course of steroids and will stop therapy for now. Consider restarting at patient discretion.

## 2013-02-13 NOTE — Assessment & Plan Note (Signed)
No increased back pain, no incontinence.

## 2013-02-13 NOTE — Progress Notes (Signed)
Patient ID: Shawn Cortez, male   DOB: 1925/05/08, 79 y.o.   MRN: [id number] Shawn Cortez [id number] [birth year] 02/13/2013      Progress Note-Follow Up  Subjective  Chief Complaint  Chief Complaint  Patient presents with  . Leg Pain    left leg and groin area pain X 5 days    HPI  Patient is a 79 year old caucasian male in today with his wife. He is c/o b/l groin pain for past week or so. It started after a recent physical therapy session he thought was especially difficult. No radicular symptoms, worsening incontinence, falls or injury. No GI or GU c/o. No CP/palp/SOB/HA.  Past Medical History  Diagnosis Date  . Arthritis   . SOB (shortness of breath) on exertion   . Hyperlipidemia   . Stroke     TIA history 2005  . Hyperglycemia 01/04/2012  . Leg pain, right 03/01/2012  . Right leg weakness 03/01/2012  . Skin lesion of right arm 09/14/2012  . Abrasion of hand 09/14/2012  . Cerumen impaction 09/14/2012  . Unsteady gait 12/26/2012  . Insomnia 12/26/2012  . Bilateral groin pain 02/13/2013    L>R    Past Surgical History  Procedure Laterality Date  . Thumb tendon severed and repaired      right, tendon snapped  . Carpal tunnel release      Right  . Achilles tendon repair      left, repaired w/ wire  . Brain surgery      done by Dr Peter Garter for tinnitius, unsuccessful, only on right  . Inguinal herniorrhapy  years ago    right and then 2 on left  . Lumbar laminectomy      L3-L5 posterior laminectomy 2006  . Joint replacement      Bilateral total knee replacement    Family History  Problem Relation Age of Onset  . Other Mother     CHF  . Ulcers Mother     Venous stasis  . Other Father     CHF  . Cancer Sister     unknown  . Hip fracture Maternal Grandmother   . Pneumonia Maternal Grandmother     History   Social History  . Marital Status: Married    Spouse Name: N/A    Number of Children: N/A  . Years of Education: N/A   Occupational History  .  Not on file.   Social History Main Topics  . Smoking status: Former Smoker -- 3.00 packs/day    Quit date: 10/16/1960  . Smokeless tobacco: Never Used  . Alcohol Use: 8.4 oz/week    14 Cans of beer per week     Comment: 1-2 beer daily  . Drug Use: No  . Sexually Active: Not on file   Other Topics Concern  . Not on file   Social History Narrative  . No narrative on file    Current Outpatient Prescriptions on File Prior to Visit  Medication Sig Dispense Refill  . clopidogrel (PLAVIX) 75 MG tablet Take 1 tablet (75 mg total) by mouth at bedtime.  90 tablet  1  . fish oil-omega-3 fatty acids 1000 MG capsule Take 500 mg by mouth daily. 2 daily       . simvastatin (ZOCOR) 40 MG tablet Take 1 tablet (40 mg total) by mouth at bedtime.  90 tablet  1   No current facility-administered medications on file prior to visit.    Allergies  Allergen Reactions  .  Niacin And Related     Facial redness  . Omnipaque (Iohexol)     Facial redness and flushing    Review of Systems  Review of Systems  Constitutional: Negative for fever and malaise/fatigue.  HENT: Negative for congestion.   Eyes: Negative for discharge.  Respiratory: Negative for shortness of breath.   Cardiovascular: Negative for chest pain, palpitations and leg swelling.  Gastrointestinal: Negative for nausea, abdominal pain and diarrhea.  Genitourinary: Negative for dysuria.  Musculoskeletal: Positive for back pain and joint pain. Negative for falls.       L>R groin pain, no radiculopathy  Skin: Negative for rash.  Neurological: Negative for loss of consciousness and headaches.  Endo/Heme/Allergies: Negative for polydipsia.  Psychiatric/Behavioral: Negative for depression and suicidal ideas. The patient is not nervous/anxious and does not have insomnia.     Objective  BP 113/69  Pulse 98  Temp(Src) 98.8 F (37.1 C) (Temporal)  Ht 6\' 4"  (1.93 m)  Wt 219 lb 1.9 oz (99.392 kg)  BMI 26.68 kg/m2  SpO2  97%  Physical Exam  Physical Exam  Constitutional: He is oriented to person, place, and time and well-developed, well-nourished, and in no distress. No distress.  HENT:  Head: Normocephalic and atraumatic.  Eyes: Conjunctivae are normal.  Neck: Neck supple. No thyromegaly present.  Cardiovascular: Normal rate, regular rhythm and normal heart sounds.   Pulmonary/Chest: Effort normal and breath sounds normal. No respiratory distress.  Abdominal: He exhibits no distension and no mass. There is no tenderness.  Musculoskeletal: He exhibits no edema.  Neurological: He is alert and oriented to person, place, and time.  Skin: Skin is warm.  Psychiatric: Memory, affect and judgment normal.    Lab Results  Component Value Date   TSH 2.03 06/10/2012   Lab Results  Component Value Date   WBC 7.0 12/17/2012   HGB 13.5 12/17/2012   HCT 40.8 12/17/2012   MCV 93.0 12/17/2012   PLT 181.0 12/17/2012   Lab Results  Component Value Date   CREATININE 1.1 12/17/2012   BUN 20 12/17/2012   NA 136 12/17/2012   K 4.3 12/17/2012   CL 104 12/17/2012   CO2 25 12/17/2012   Lab Results  Component Value Date   ALT 13 12/17/2012   AST 22 12/17/2012   ALKPHOS 59 12/17/2012   BILITOT 0.9 12/17/2012   Lab Results  Component Value Date   CHOL 136 06/10/2012   Lab Results  Component Value Date   HDL 54.20 06/10/2012   Lab Results  Component Value Date   LDLCALC 63 06/10/2012   Lab Results  Component Value Date   TRIG 94.0 06/10/2012   Lab Results  Component Value Date   CHOLHDL 3 06/10/2012     Assessment & Plan  Bilateral groin pain started after a recent session of PT, xrays unremarkable for any acute injury. Will give a course of steroids and will stop therapy for now. Consider restarting at patient discretion.   SPINAL STENOSIS, LUMBAR No increased back pain, no incontinence.

## 2013-03-24 ENCOUNTER — Other Ambulatory Visit: Payer: Self-pay | Admitting: *Deleted

## 2013-03-24 MED ORDER — CLOPIDOGREL BISULFATE 75 MG PO TABS: 75.0000 mg | ORAL_TABLET | Freq: Every day | ORAL | Status: DC

## 2013-03-25 ENCOUNTER — Other Ambulatory Visit: Payer: Self-pay | Admitting: Family Medicine

## 2013-03-28 ENCOUNTER — Encounter: Payer: Self-pay | Admitting: Family Medicine

## 2013-03-28 ENCOUNTER — Ambulatory Visit (INDEPENDENT_AMBULATORY_CARE_PROVIDER_SITE_OTHER): Payer: Medicare Other | Admitting: Family Medicine

## 2013-03-28 VITALS — BP 122/86 | HR 105 | Temp 97.6°F | Ht 76.0 in | Wt 215.1 lb

## 2013-03-28 DIAGNOSIS — E785 Hyperlipidemia, unspecified: Secondary | ICD-10-CM

## 2013-03-28 DIAGNOSIS — I714 Abdominal aortic aneurysm, without rupture: Secondary | ICD-10-CM

## 2013-03-28 DIAGNOSIS — R7309 Other abnormal glucose: Secondary | ICD-10-CM

## 2013-03-28 DIAGNOSIS — R739 Hyperglycemia, unspecified: Secondary | ICD-10-CM

## 2013-03-28 DIAGNOSIS — R609 Edema, unspecified: Secondary | ICD-10-CM

## 2013-03-28 DIAGNOSIS — I25810 Atherosclerosis of coronary artery bypass graft(s) without angina pectoris: Secondary | ICD-10-CM

## 2013-03-28 MED ORDER — FUROSEMIDE 20 MG PO TABS: 20.0000 mg | ORAL_TABLET | Freq: Every day | ORAL | Status: DC | PRN

## 2013-03-28 NOTE — Progress Notes (Signed)
Patient ID: Shawn Cortez, male   DOB: [birth year], 79 y.o.   MRN: [id number] HEINRICH FERTIG [id number] Nov 24, 1924 03/28/2013      Progress Note-Follow Up  Subjective  Chief Complaint  Chief Complaint  Patient presents with  . leg and feet swelling    redness    HPI  Patient 79 year old Caucasian male is in today with his wife. He is a long history of some mild peripheral edema but recently it worsened. He denies any falls or trauma. There is no pain associated. No prolonged travel. He notes increased swelling there's been some mild edema as well. They deny any fevers or chills. No recent illness or change in diet or activity levels. No chest pain, palpitations, shortness of breath, GI or GU complaints  Past Medical History  Diagnosis Date  . Arthritis   . SOB (shortness of breath) on exertion   . Hyperlipidemia   . Stroke     TIA history 2005  . Hyperglycemia 01/04/2012  . Leg pain, right 03/01/2012  . Right leg weakness 03/01/2012  . Skin lesion of right arm 09/14/2012  . Abrasion of hand 09/14/2012  . Cerumen impaction 09/14/2012  . Unsteady gait 12/26/2012  . Insomnia 12/26/2012  . Bilateral groin pain 02/13/2013    L>R    Past Surgical History  Procedure Laterality Date  . Thumb tendon severed and repaired      right, tendon snapped  . Carpal tunnel release      Right  . Achilles tendon repair      left, repaired w/ wire  . Brain surgery      done by Dr Peter Garter for tinnitius, unsuccessful, only on right  . Inguinal herniorrhapy  years ago    right and then 2 on left  . Lumbar laminectomy      L3-L5 posterior laminectomy 2006  . Joint replacement      Bilateral total knee replacement    Family History  Problem Relation Age of Onset  . Other Mother     CHF  . Ulcers Mother     Venous stasis  . Other Father     CHF  . Cancer Sister     unknown  . Hip fracture Maternal Grandmother   . Pneumonia Maternal Grandmother     History   Social History  .  Marital Status: Married    Spouse Name: N/A    Number of Children: N/A  . Years of Education: N/A   Occupational History  . Not on file.   Social History Main Topics  . Smoking status: Former Smoker -- 3.00 packs/day    Quit date: 10/16/1960  . Smokeless tobacco: Never Used  . Alcohol Use: 8.4 oz/week    14 Cans of beer per week     Comment: 1-2 beer daily  . Drug Use: No  . Sexually Active: Not on file   Other Topics Concern  . Not on file   Social History Narrative  . No narrative on file    Current Outpatient Prescriptions on File Prior to Visit  Medication Sig Dispense Refill  . clopidogrel (PLAVIX) 75 MG tablet Take 1 tablet (75 mg total) by mouth at bedtime.  90 tablet  1  . fish oil-omega-3 fatty acids 1000 MG capsule Take 500 mg by mouth daily. 2 daily       . LORazepam (ATIVAN) 0.5 MG tablet Take 1 tablet (0.5 mg total) by mouth at bedtime as needed for anxiety.  90 tablet  1  . methylPREDNISolone (MEDROL DOSEPAK) 4 MG tablet Follow package directions  21 tablet  1  . simvastatin (ZOCOR) 40 MG tablet TAKE 1 TABLET (40 MG TOTAL) BY MOUTH AT BEDTIME.  90 tablet  1   No current facility-administered medications on file prior to visit.    Allergies  Allergen Reactions  . Niacin And Related     Facial redness  . Omnipaque (Iohexol)     Facial redness and flushing    Review of Systems  Review of Systems  Constitutional: Negative for fever and malaise/fatigue.  HENT: Negative for congestion.   Eyes: Negative for discharge.  Respiratory: Negative for shortness of breath.   Cardiovascular: Positive for leg swelling. Negative for chest pain and palpitations.  Gastrointestinal: Negative for nausea, abdominal pain and diarrhea.  Genitourinary: Negative for dysuria.  Musculoskeletal: Negative for falls.  Skin: Negative for rash.  Neurological: Negative for loss of consciousness and headaches.  Endo/Heme/Allergies: Negative for polydipsia.  Psychiatric/Behavioral:  Negative for depression and suicidal ideas. The patient is not nervous/anxious and does not have insomnia.      Objective  BP 122/86  Pulse 105  Temp(Src) 97.6 F (36.4 C) (Oral)  Ht 6\' 4"  (1.93 m)  Wt 215 lb 1.9 oz (97.578 kg)  BMI 26.2 kg/m2  SpO2 96%  Physical Exam  Physical Exam  Constitutional: He is oriented to person, place, and time and well-developed, well-nourished, and in no distress. No distress.  HENT:  Head: Normocephalic and atraumatic.  Eyes: Conjunctivae are normal.  Neck: Neck supple. No thyromegaly present.  Cardiovascular: Normal rate, regular rhythm and normal heart sounds.   Pulmonary/Chest: Effort normal and breath sounds normal. No respiratory distress.  Abdominal: He exhibits no distension and no mass. There is no tenderness.  Musculoskeletal: He exhibits edema.  B/l feet and ankles  Neurological: He is alert and oriented to person, place, and time.  Skin: Skin is warm.  Psychiatric: Memory, affect and judgment normal.    Lab Results  Component Value Date   TSH 2.03 06/10/2012   Lab Results  Component Value Date   WBC 7.0 12/17/2012   HGB 13.5 12/17/2012   HCT 40.8 12/17/2012   MCV 93.0 12/17/2012   PLT 181.0 12/17/2012   Lab Results  Component Value Date   CREATININE 1.1 12/17/2012   BUN 20 12/17/2012   NA 136 12/17/2012   K 4.3 12/17/2012   CL 104 12/17/2012   CO2 25 12/17/2012   Lab Results  Component Value Date   ALT 13 12/17/2012   AST 22 12/17/2012   ALKPHOS 59 12/17/2012   BILITOT 0.9 12/17/2012   Lab Results  Component Value Date   CHOL 136 06/10/2012   Lab Results  Component Value Date   HDL 54.20 06/10/2012   Lab Results  Component Value Date   LDLCALC 63 06/10/2012   Lab Results  Component Value Date   TRIG 94.0 06/10/2012   Lab Results  Component Value Date   CHOLHDL 3 06/10/2012     Assessment & Plan    Peripheral edema Encouraged compression hose DASH diet, elevate feet. Will check a 2 d Echo due to multiple  risk factors.   ABDOMINAL AORTIC ANEURYSM Vascular disease noted may need lower extremity studies.  HYPERLIPIDEMIA Tolerating Simvastatin  Hyperglycemia Encouraged to minimize simple carbs

## 2013-03-28 NOTE — Patient Instructions (Addendum)
Labs prior hgba1c, lipid, renal, hepatic, cbc, sed rate Compression stockings light weight 10 to 20 mmhg on in am off inpm Elevate feet above heart for 10-15 minutes twice a day  Edema Edema is an abnormal build-up of fluids in tissues. Because this is partly dependent on gravity (water flows to the lowest place), it is more common in the legs and thighs (lower extremities). It is also common in the looser tissues, like around the eyes. Painless swelling of the feet and ankles is common and increases as a person ages. It may affect both legs and may include the calves or even thighs. When squeezed, the fluid may move out of the affected area and may leave a dent for a few moments. CAUSES   Prolonged standing or sitting in one place for extended periods of time. Movement helps pump tissue fluid into the veins, and absence of movement prevents this, resulting in edema.  Varicose veins. The valves in the veins do not work as well as they should. This causes fluid to leak into the tissues.  Fluid and salt overload.  Injury, burn, or surgery to the leg, ankle, or foot, may damage veins and allow fluid to leak out.  Sunburn damages vessels. Leaky vessels allow fluid to go out into the sunburned tissues.  Allergies (from insect bites or stings, medications or chemicals) cause swelling by allowing vessels to become leaky.  Protein in the blood helps keep fluid in your vessels. Low protein, as in malnutrition, allows fluid to leak out.  Hormonal changes, including pregnancy and menstruation, cause fluid retention. This fluid may leak out of vessels and cause edema.  Medications that cause fluid retention. Examples are sex hormones, blood pressure medications, steroid treatment, or anti-depressants.  Some illnesses cause edema, especially heart failure, kidney disease, or liver disease.  Surgery that cuts veins or lymph nodes, such as surgery done for the heart or for breast cancer, may result in  edema. DIAGNOSIS  Your caregiver is usually easily able to determine what is causing your swelling (edema) by simply asking what is wrong (getting a history) and examining you (doing a physical). Sometimes x-rays, EKG (electrocardiogram or heart tracing), and blood work may be done to evaluate for underlying medical illness. TREATMENT  General treatment includes:  Leg elevation (or elevation of the affected body part).  Restriction of fluid intake.  Prevention of fluid overload.  Compression of the affected body part. Compression with elastic bandages or support stockings squeezes the tissues, preventing fluid from entering and forcing it back into the blood vessels.  Diuretics (also called water pills or fluid pills) pull fluid out of your body in the form of increased urination. These are effective in reducing the swelling, but can have side effects and must be used only under your caregiver's supervision. Diuretics are appropriate only for some types of edema. The specific treatment can be directed at any underlying causes discovered. Heart, liver, or kidney disease should be treated appropriately. HOME CARE INSTRUCTIONS   Elevate the legs (or affected body part) above the level of the heart, while lying down.  Avoid sitting or standing still for prolonged periods of time.  Avoid putting anything directly under the knees when lying down, and do not wear constricting clothing or garters on the upper legs.  Exercising the legs causes the fluid to work back into the veins and lymphatic channels. This may help the swelling go down.  The pressure applied by elastic bandages or support stockings can  help reduce ankle swelling.  A low-salt diet may help reduce fluid retention and decrease the ankle swelling.  Take any medications exactly as prescribed. SEEK MEDICAL CARE IF:  Your edema is not responding to recommended treatments. SEEK IMMEDIATE MEDICAL CARE IF:   You develop shortness  of breath or chest pain.  You cannot breathe when you lay down; or if, while lying down, you have to get up and go to the window to get your breath.  You are having increasing swelling without relief from treatment.  You develop a fever over 102 F (38.9 C).  You develop pain or redness in the areas that are swollen.  Tell your caregiver right away if you have gained 3 lb/1.4 kg in 1 day or 5 lb/2.3 kg in a week. MAKE SURE YOU:   Understand these instructions.  Will watch your condition.  Will get help right away if you are not doing well or get worse. Document Released: 10/13/2005 Document Revised: 04/13/2012 Document Reviewed: 05/31/2008 [facility] Patient Information 2014 [place], [place].

## 2013-03-29 ENCOUNTER — Encounter: Payer: Self-pay | Admitting: Family Medicine

## 2013-03-29 ENCOUNTER — Telehealth: Payer: Self-pay | Admitting: General Practice

## 2013-03-29 DIAGNOSIS — R600 Localized edema: Secondary | ICD-10-CM

## 2013-03-29 DIAGNOSIS — R609 Edema, unspecified: Secondary | ICD-10-CM

## 2013-03-29 HISTORY — DX: Localized edema: R60.0

## 2013-03-29 HISTORY — DX: Edema, unspecified: R60.9

## 2013-03-29 MED ORDER — SIMVASTATIN 40 MG PO TABS: ORAL_TABLET | ORAL | Status: DC

## 2013-03-29 NOTE — Telephone Encounter (Signed)
Called to schedule a 2D Echo without Contrast with [facility] [phone]. Left a message to return call. Pt prefers Wednesdays. Primary Diagnosis Edema.

## 2013-03-29 NOTE — Assessment & Plan Note (Signed)
Encouraged to minimiz e simple carbs 

## 2013-03-29 NOTE — Telephone Encounter (Signed)
Patient can drop down from Plavix to Aspirin 81 mg daily 3 days prior to his teeth cleaning and then restart the Plavix when they are done. I am not sure who does the precert for the Echo. He has edema, cardiac murmur, hyperglycemia, aortic aneurysm, hyperlipidemia, fatigue

## 2013-03-29 NOTE — Assessment & Plan Note (Signed)
Encouraged compression hose DASH diet, elevate feet. Will check a 2 d Echo due to multiple risk factors.

## 2013-03-29 NOTE — Telephone Encounter (Signed)
Med filled.  

## 2013-03-29 NOTE — Assessment & Plan Note (Signed)
Vascular disease noted may need lower extremity studies.

## 2013-03-29 NOTE — Telephone Encounter (Signed)
[facility] with [facility] states the dentist was cleaning pts teeth today and it was bleeding and then realized pt was on a high blood thinner. Dentist would like to work some more on pts teeth within the next couple of weeks but would like to know if pt can go down on the blood thinner?  Please advise?   Shawn [phone]

## 2013-03-29 NOTE — Assessment & Plan Note (Signed)
Tolerating Simvastatin

## 2013-03-29 NOTE — Telephone Encounter (Signed)
Called [facility] and was set up a tentative date of 04-06-13 @ 11am for Echo @ [place]. They stated that the Pt would need pre-certification with [facility] Medicare. Please can you call to certify this?

## 2013-03-30 NOTE — Telephone Encounter (Signed)
Meredith informed about the Plavix and aspirin

## 2013-04-06 ENCOUNTER — Ambulatory Visit (HOSPITAL_BASED_OUTPATIENT_CLINIC_OR_DEPARTMENT_OTHER)
Admission: RE | Admit: 2013-04-06 | Discharge: 2013-04-06 | Disposition: A | Discharge status: Home / Self Care | Payer: Medicare Other | Source: Ambulatory Visit | Attending: Family Medicine | Admitting: Family Medicine

## 2013-04-06 DIAGNOSIS — R609 Edema, unspecified: Secondary | ICD-10-CM | POA: Insufficient documentation

## 2013-04-06 DIAGNOSIS — I25810 Atherosclerosis of coronary artery bypass graft(s) without angina pectoris: Secondary | ICD-10-CM

## 2013-04-06 DIAGNOSIS — Z8673 Personal history of transient ischemic attack (TIA), and cerebral infarction without residual deficits: Secondary | ICD-10-CM | POA: Insufficient documentation

## 2013-04-06 DIAGNOSIS — I359 Nonrheumatic aortic valve disorder, unspecified: Secondary | ICD-10-CM | POA: Insufficient documentation

## 2013-04-06 DIAGNOSIS — E785 Hyperlipidemia, unspecified: Secondary | ICD-10-CM | POA: Insufficient documentation

## 2013-04-06 DIAGNOSIS — R011 Cardiac murmur, unspecified: Secondary | ICD-10-CM | POA: Insufficient documentation

## 2013-04-06 NOTE — Progress Notes (Signed)
  Echocardiogram 2D Echocardiogram has been performed.  ZARIF, RATHJE 04/06/2013, 12:15 PM

## 2013-04-07 NOTE — Progress Notes (Signed)
Quick Note:  Patient Informed and voiced understanding ______ 

## 2013-04-25 ENCOUNTER — Encounter: Payer: Self-pay | Admitting: Family Medicine

## 2013-04-25 ENCOUNTER — Ambulatory Visit (INDEPENDENT_AMBULATORY_CARE_PROVIDER_SITE_OTHER): Payer: Medicare Other | Admitting: Family Medicine

## 2013-04-25 VITALS — BP 120/60 | HR 65 | Temp 98.3°F | Ht 76.0 in | Wt 215.1 lb

## 2013-04-25 DIAGNOSIS — R600 Localized edema: Secondary | ICD-10-CM

## 2013-04-25 DIAGNOSIS — R609 Edema, unspecified: Secondary | ICD-10-CM

## 2013-04-25 DIAGNOSIS — R269 Unspecified abnormalities of gait and mobility: Secondary | ICD-10-CM

## 2013-04-25 DIAGNOSIS — I714 Abdominal aortic aneurysm, without rupture, unspecified: Secondary | ICD-10-CM

## 2013-04-25 NOTE — Patient Instructions (Addendum)
Next visit 2-3 months  Peripheral Edema You have swelling in your legs (peripheral edema). This swelling is due to excess accumulation of salt and water in your body. Edema may be a sign of heart, kidney or liver disease, or a side effect of a medication. It may also be due to problems in the leg veins. Elevating your legs and using special support stockings may be very helpful, if the cause of the swelling is due to poor venous circulation. Avoid long periods of standing, whatever the cause. Treatment of edema depends on identifying the cause. Chips, pretzels, pickles and other salty foods should be avoided. Restricting salt in your diet is almost always needed. Water pills (diuretics) are often used to remove the excess salt and water from your body via urine. These medicines prevent the kidney from reabsorbing sodium. This increases urine flow. Diuretic treatment may also result in lowering of potassium levels in your body. Potassium supplements may be needed if you have to use diuretics daily. Daily weights can help you keep track of your progress in clearing your edema. You should call your caregiver for follow up care as recommended. SEEK IMMEDIATE MEDICAL CARE IF:   You have increased swelling, pain, redness, or heat in your legs.  You develop shortness of breath, especially when lying down.  You develop chest or abdominal pain, weakness, or fainting.  You have a fever. Document Released: 11/20/2004 Document Revised: 01/05/2012 Document Reviewed: 10/31/2009 [facility] Patient Information 2014 [place], [place].

## 2013-04-26 ENCOUNTER — Encounter: Payer: Self-pay | Admitting: Family Medicine

## 2013-04-26 NOTE — Progress Notes (Signed)
Patient ID: MARISSA LOWREY, male   DOB: 1924/11/09, 79 y.o.   MRN: [id number] RAJIV PARLATO [id number] 04/07/1925 04/26/2013      Progress Note-Follow Up  Subjective  Chief Complaint  Chief Complaint  Patient presents with  . Follow-up    4 week    HPI  Patient is an 79 year old Caucasian male who is in today for followup. His edema did not seem to improve with the Lasix so they stopped it. He tried compression hose but they actually started his balance more so they stopped these as well. He has not been consistent about elevating his feet. He does deny shortness of breath the rest of the persistent shortness of breath with exertion. No chest pain or palpitations. No recent illness or fevers. No GI or GU complaints noted at this time. Taking other meds as prescribed  Past Medical History  Diagnosis Date  . Arthritis   . SOB (shortness of breath) on exertion   . Hyperlipidemia   . Stroke     TIA history 2005  . Hyperglycemia 01/04/2012  . Leg pain, right 03/01/2012  . Right leg weakness 03/01/2012  . Skin lesion of right arm 09/14/2012  . Abrasion of hand 09/14/2012  . Cerumen impaction 09/14/2012  . Unsteady gait 12/26/2012  . Insomnia 12/26/2012  . Bilateral groin pain 02/13/2013    L>R  . Peripheral edema 03/29/2013    Past Surgical History  Procedure Laterality Date  . Thumb tendon severed and repaired      right, tendon snapped  . Carpal tunnel release      Right  . Achilles tendon repair      left, repaired w/ wire  . Brain surgery      done by Dr Peter Garter for tinnitius, unsuccessful, only on right  . Inguinal herniorrhapy  years ago    right and then 2 on left  . Lumbar laminectomy      L3-L5 posterior laminectomy 2006  . Joint replacement      Bilateral total knee replacement    Family History  Problem Relation Age of Onset  . Other Mother     CHF  . Ulcers Mother     Venous stasis  . Other Father     CHF  . Cancer Sister     unknown  . Hip fracture  Maternal Grandmother   . Pneumonia Maternal Grandmother     History   Social History  . Marital Status: Married    Spouse Name: N/A    Number of Children: N/A  . Years of Education: N/A   Occupational History  . Not on file.   Social History Main Topics  . Smoking status: Former Smoker -- 3.00 packs/day    Quit date: 10/16/1960  . Smokeless tobacco: Never Used  . Alcohol Use: 8.4 oz/week    14 Cans of beer per week     Comment: 1-2 beer daily  . Drug Use: No  . Sexually Active: Not on file   Other Topics Concern  . Not on file   Social History Narrative  . No narrative on file    Current Outpatient Prescriptions on File Prior to Visit  Medication Sig Dispense Refill  . clopidogrel (PLAVIX) 75 MG tablet Take 1 tablet (75 mg total) by mouth at bedtime.  90 tablet  1  . fish oil-omega-3 fatty acids 1000 MG capsule Take 500 mg by mouth daily. 2 daily       . furosemide (  LASIX) 20 MG tablet Take 1 tablet (20 mg total) by mouth daily as needed (1 daily for 5 days and then as needed).  30 tablet  1  . LORazepam (ATIVAN) 0.5 MG tablet Take 1 tablet (0.5 mg total) by mouth at bedtime as needed for anxiety.  90 tablet  1  . simvastatin (ZOCOR) 40 MG tablet TAKE 1 TABLET (40 MG TOTAL) BY MOUTH AT BEDTIME.  90 tablet  1  . methylPREDNISolone (MEDROL DOSEPAK) 4 MG tablet Follow package directions  21 tablet  1   No current facility-administered medications on file prior to visit.    Allergies  Allergen Reactions  . Niacin And Related     Facial redness  . Omnipaque (Iohexol)     Facial redness and flushing    Review of Systems  Review of Systems  Constitutional: Positive for malaise/fatigue. Negative for fever.  HENT: Negative for congestion.   Eyes: Negative for discharge.  Respiratory: Positive for shortness of breath.   Cardiovascular: Negative for chest pain, palpitations and leg swelling.  Gastrointestinal: Negative for nausea, abdominal pain and diarrhea.   Genitourinary: Negative for dysuria.  Musculoskeletal: Negative for falls.  Skin: Negative for rash.  Neurological: Positive for sensory change and focal weakness. Negative for loss of consciousness and headaches.  Endo/Heme/Allergies: Negative for polydipsia.  Psychiatric/Behavioral: Negative for depression and suicidal ideas. The patient is not nervous/anxious and does not have insomnia.     Objective  BP 120/60  Pulse 65  Temp(Src) 98.3 F (36.8 C) (Oral)  Ht 6\' 4"  (1.93 m)  Wt 215 lb 1.9 oz (97.578 kg)  BMI 26.2 kg/m2  SpO2 97%  Physical Exam  Physical Exam  Constitutional: He is oriented to person, place, and time and well-developed, well-nourished, and in no distress. No distress.  Frail with walker  HENT:  Head: Normocephalic and atraumatic.  Eyes: Conjunctivae are normal.  Neck: Neck supple. No thyromegaly present.  Cardiovascular: Normal rate, regular rhythm and normal heart sounds.  Exam reveals no gallop.   Pulmonary/Chest: Effort normal and breath sounds normal. No respiratory distress.  Abdominal: He exhibits no distension and no mass. There is no tenderness.  Musculoskeletal: He exhibits edema.  1 + pedal edema b/l  Neurological: He is alert and oriented to person, place, and time.  Skin: Skin is warm.  Psychiatric: Memory, affect and judgment normal.    Lab Results  Component Value Date   TSH 2.03 06/10/2012   Lab Results  Component Value Date   WBC 7.0 12/17/2012   HGB 13.5 12/17/2012   HCT 40.8 12/17/2012   MCV 93.0 12/17/2012   PLT 181.0 12/17/2012   Lab Results  Component Value Date   CREATININE 1.1 12/17/2012   BUN 20 12/17/2012   NA 136 12/17/2012   K 4.3 12/17/2012   CL 104 12/17/2012   CO2 25 12/17/2012   Lab Results  Component Value Date   ALT 13 12/17/2012   AST 22 12/17/2012   ALKPHOS 59 12/17/2012   BILITOT 0.9 12/17/2012   Lab Results  Component Value Date   CHOL 136 06/10/2012   Lab Results  Component Value Date   HDL 54.20  06/10/2012   Lab Results  Component Value Date   LDLCALC 63 06/10/2012   Lab Results  Component Value Date   TRIG 94.0 06/10/2012   Lab Results  Component Value Date   CHOLHDL 3 06/10/2012     Assessment & Plan  ABDOMINAL AORTIC ANEURYSM Follows closely with  vascular surgery asymptomatic  Peripheral edema Minimize sodium, elevate feet above heart several times a day. Did not tolerate compression hose. May use Lasix sparingly  UNSTEADY GAIT Gets around home with walker but does have limited mobility at this time.

## 2013-04-26 NOTE — Assessment & Plan Note (Signed)
Minimize sodium, elevate feet above heart several times a day. Did not tolerate compression hose. May use Lasix sparingly

## 2013-04-26 NOTE — Assessment & Plan Note (Signed)
Follows closely with vascular surgery asymptomatic

## 2013-04-26 NOTE — Assessment & Plan Note (Signed)
Gets around home with walker but does have limited mobility at this time.

## 2013-06-16 ENCOUNTER — Other Ambulatory Visit: Payer: Medicare Other

## 2013-06-17 ENCOUNTER — Other Ambulatory Visit (INDEPENDENT_AMBULATORY_CARE_PROVIDER_SITE_OTHER): Payer: Medicare Other

## 2013-06-17 DIAGNOSIS — Z0000 Encounter for general adult medical examination without abnormal findings: Secondary | ICD-10-CM

## 2013-06-17 DIAGNOSIS — R7309 Other abnormal glucose: Secondary | ICD-10-CM

## 2013-06-17 DIAGNOSIS — E785 Hyperlipidemia, unspecified: Secondary | ICD-10-CM

## 2013-06-17 DIAGNOSIS — R29898 Other symptoms and signs involving the musculoskeletal system: Secondary | ICD-10-CM

## 2013-06-17 DIAGNOSIS — I714 Abdominal aortic aneurysm, without rupture: Secondary | ICD-10-CM

## 2013-06-17 LAB — RENAL FUNCTION PANEL
Albumin: 3.9 g/dL (ref 3.5–5.2)
Chloride: 102 mEq/L (ref 96–112)
GFR: 77.05 mL/min (ref >60.00)
Phosphorus: 3.1 mg/dL (ref 2.3–4.6)
Potassium: 4.1 mEq/L (ref 3.5–5.1)

## 2013-06-17 LAB — HEPATIC FUNCTION PANEL
Albumin: 3.9 g/dL (ref 3.5–5.2)
Bilirubin, Direct: 0 mg/dL (ref 0.0–0.3)
Total Protein: 7.2 g/dL (ref 6.0–8.3)

## 2013-06-17 LAB — CBC
HCT: 40.7 % (ref 39.0–52.0)
Hemoglobin: 13.7 g/dL (ref 13.0–17.0)
MCHC: 33.7 g/dL (ref 30.0–36.0)
Platelets: 207 10*3/uL (ref 150.0–400.0)
RDW: 15.1 % — ABNORMAL HIGH (ref 11.5–14.6)

## 2013-06-17 LAB — LIPID PANEL
Cholesterol: 128 mg/dL (ref 0–200)
LDL Cholesterol: 59 mg/dL (ref 0–99)
VLDL: 21.6 mg/dL (ref 0.0–40.0)

## 2013-06-17 NOTE — Progress Notes (Signed)
Labs only

## 2013-06-23 ENCOUNTER — Ambulatory Visit (INDEPENDENT_AMBULATORY_CARE_PROVIDER_SITE_OTHER): Payer: Medicare Other | Admitting: Family Medicine

## 2013-06-23 ENCOUNTER — Encounter: Payer: Self-pay | Admitting: Family Medicine

## 2013-06-23 VITALS — BP 120/60 | HR 103 | Temp 97.6°F | Ht 76.0 in | Wt 214.1 lb

## 2013-06-23 DIAGNOSIS — I714 Abdominal aortic aneurysm, without rupture: Secondary | ICD-10-CM

## 2013-06-23 DIAGNOSIS — R29898 Other symptoms and signs involving the musculoskeletal system: Secondary | ICD-10-CM

## 2013-06-23 DIAGNOSIS — R7309 Other abnormal glucose: Secondary | ICD-10-CM

## 2013-06-23 DIAGNOSIS — E785 Hyperlipidemia, unspecified: Secondary | ICD-10-CM

## 2013-06-23 DIAGNOSIS — R739 Hyperglycemia, unspecified: Secondary | ICD-10-CM

## 2013-06-23 DIAGNOSIS — Z0000 Encounter for general adult medical examination without abnormal findings: Secondary | ICD-10-CM

## 2013-06-23 NOTE — Progress Notes (Signed)
Patient ID: Shawn Cortez, male   DOB: 05-08-1925, 79 y.o.   MRN: [id number] Shawn Cortez [id number] 1925-09-12 06/23/2013      Progress Note-Follow Up  Subjective  Chief Complaint  Chief Complaint  Patient presents with  . Annual Exam    physical    HPI  Patient is an 79 year old Caucasian male who is in today with his wife for annual exam. They continue to struggle with debility and right lower extremity pain and weakness. Using a walker he is getting around his home and doing well. No recent falls or further injury. Did not tolerate physical therapy. No recent fevers or chills. No headache, chest pain, palpitations, shortness of breath, GI or GU complaints. No abdominal pain or syncope.  Past Medical History  Diagnosis Date  . Arthritis   . SOB (shortness of breath) on exertion   . Hyperlipidemia   . Stroke     TIA history 2005  . Hyperglycemia 01/04/2012  . Leg pain, right 03/01/2012  . Right leg weakness 03/01/2012  . Skin lesion of right arm 09/14/2012  . Abrasion of hand 09/14/2012  . Cerumen impaction 09/14/2012  . Unsteady gait 12/26/2012  . Insomnia 12/26/2012  . Bilateral groin pain 02/13/2013    L>R  . Peripheral edema 03/29/2013    Past Surgical History  Procedure Laterality Date  . Thumb tendon severed and repaired      right, tendon snapped  . Carpal tunnel release      Right  . Achilles tendon repair      left, repaired w/ wire  . Brain surgery      done by Dr Peter Garter for tinnitius, unsuccessful, only on right  . Inguinal herniorrhapy  years ago    right and then 2 on left  . Lumbar laminectomy      L3-L5 posterior laminectomy 2006  . Joint replacement      Bilateral total knee replacement    Family History  Problem Relation Age of Onset  . Other Mother     CHF  . Ulcers Mother     Venous stasis  . Other Father     CHF  . Cancer Sister     unknown  . Hip fracture Maternal Grandmother   . Pneumonia Maternal Grandmother     History    Social History  . Marital Status: Married    Spouse Name: N/A    Number of Children: N/A  . Years of Education: N/A   Occupational History  . Not on file.   Social History Main Topics  . Smoking status: Former Smoker -- 3.00 packs/day    Quit date: 10/16/1960  . Smokeless tobacco: Never Used  . Alcohol Use: 8.4 oz/week    14 Cans of beer per week     Comment: 1-2 beer daily  . Drug Use: No  . Sexual Activity: Not on file   Other Topics Concern  . Not on file   Social History Narrative  . No narrative on file    Current Outpatient Prescriptions on File Prior to Visit  Medication Sig Dispense Refill  . clopidogrel (PLAVIX) 75 MG tablet Take 1 tablet (75 mg total) by mouth at bedtime.  90 tablet  1  . fish oil-omega-3 fatty acids 1000 MG capsule Take 500 mg by mouth daily. 2 daily       . furosemide (LASIX) 20 MG tablet Take 1 tablet (20 mg total) by mouth daily as needed (1 daily for  5 days and then as needed).  30 tablet  1  . LORazepam (ATIVAN) 0.5 MG tablet Take 1 tablet (0.5 mg total) by mouth at bedtime as needed for anxiety.  90 tablet  1  . methylPREDNISolone (MEDROL DOSEPAK) 4 MG tablet Follow package directions  21 tablet  1  . simvastatin (ZOCOR) 40 MG tablet TAKE 1 TABLET (40 MG TOTAL) BY MOUTH AT BEDTIME.  90 tablet  1   No current facility-administered medications on file prior to visit.    Allergies  Allergen Reactions  . Niacin And Related     Facial redness  . Omnipaque [Iohexol]     Facial redness and flushing    Review of Systems  Review of Systems  Constitutional: Negative for fever, chills and malaise/fatigue.  HENT: Negative for hearing loss, nosebleeds and congestion.   Eyes: Negative for discharge.  Respiratory: Negative for cough, sputum production, shortness of breath and wheezing.   Cardiovascular: Negative for chest pain, palpitations and leg swelling.  Gastrointestinal: Negative for heartburn, nausea, vomiting, abdominal pain,  diarrhea, constipation and blood in stool.  Genitourinary: Negative for dysuria, urgency, frequency and hematuria.  Musculoskeletal: Negative for myalgias, back pain and falls.  Skin: Negative for rash.  Neurological: Negative for dizziness, tremors, sensory change, focal weakness, loss of consciousness, weakness and headaches.  Endo/Heme/Allergies: Negative for polydipsia. Does not bruise/bleed easily.  Psychiatric/Behavioral: Negative for depression and suicidal ideas. The patient is not nervous/anxious and does not have insomnia.     Objective  BP 120/60  Pulse 103  Temp(Src) 97.6 F (36.4 C) (Oral)  Ht 6\' 4"  (1.93 m)  Wt 214 lb 1.3 oz (97.106 kg)  BMI 26.07 kg/m2  SpO2 95%  Physical Exam  Physical Exam  Constitutional: He is oriented to person, place, and time and well-developed, well-nourished, and in no distress. No distress.  HENT:  Head: Normocephalic and atraumatic.  Eyes: Conjunctivae are normal.  Neck: Neck supple. No thyromegaly present.  Cardiovascular: Normal rate and regular rhythm.   Murmur heard. Pulmonary/Chest: Effort normal and breath sounds normal. No respiratory distress.  Abdominal: He exhibits no distension and no mass. There is no tenderness.  Musculoskeletal: He exhibits no edema.  Neurological: He is alert and oriented to person, place, and time. Coordination abnormal.  Unsteady gait using walker.  Skin: Skin is warm.  Psychiatric: Memory, affect and judgment normal.    Lab Results  Component Value Date   TSH 1.62 06/17/2013   Lab Results  Component Value Date   WBC 6.8 06/17/2013   HGB 13.7 06/17/2013   HCT 40.7 06/17/2013   MCV 92.2 06/17/2013   PLT 207.0 06/17/2013   Lab Results  Component Value Date   CREATININE 1.0 06/17/2013   BUN 18 06/17/2013   NA 135 06/17/2013   K 4.1 06/17/2013   CL 102 06/17/2013   CO2 27 06/17/2013   Lab Results  Component Value Date   ALT 13 06/17/2013   AST 20 06/17/2013   ALKPHOS 64 06/17/2013   BILITOT 0.9  06/17/2013   Lab Results  Component Value Date   CHOL 128 06/17/2013   Lab Results  Component Value Date   HDL 47.00 06/17/2013   Lab Results  Component Value Date   LDLCALC 59 06/17/2013   Lab Results  Component Value Date   TRIG 108.0 06/17/2013   Lab Results  Component Value Date   CHOLHDL 3 06/17/2013     Assessment & Plan  ABDOMINAL AORTIC ANEURYSM Follows with  vascular surgeon has been slowly enlarging. asymptomatic  Right leg weakness Did not tolerate PT, is doing well at home with the help of his wife. No further therapy at this time. Will need to continue using assistive devices such as cane and or walker.  HYPERLIPIDEMIA Well controlled, no changes today  Hyperglycemia Glucose wnl today. Minimize simple carbs.  Preventative health care Doing well in his home with the help of his wife. They are managing his ADLs well, no depression or recent illness. No falls or anorexia. Encouraged heart healthy diet, movement as tolerated and adequate sleep

## 2013-06-23 NOTE — Patient Instructions (Addendum)
Start a probiotic such as Dig[employer] Ad[employer]age daily  Needs an appt with dermatology for complete body scan   Constipation, Adult Constipation is when a person has fewer than 3 bowel movements a week; has difficulty having a bowel movement; or has stools that are dry, hard, or larger than normal. As people grow older, constipation is more common. If you try to fix constipation with medicines that make you have a bowel movement (laxatives), the problem may get worse. Long-term laxative use may cause the muscles of the colon to become weak. A low-fiber diet, not taking in enough fluids, and taking certain medicines may make constipation worse. CAUSES   Certain medicines, such as antidepressants, pain medicine, iron supplements, antacids, and water pills.   Certain diseases, such as diabetes, irritable bowel syndrome (IBS), thyroid disease, or depression.   Not drinking enough water.   Not eating enough fiber-rich foods.   Stress or travel.  Lack of physical activity or exercise.  Not going to the restroom when there is the urge to have a bowel movement.  Ignoring the urge to have a bowel movement.  Using laxatives too much. SYMPTOMS   Having fewer than 3 bowel movements a week.   Straining to have a bowel movement.   Having hard, dry, or larger than normal stools.   Feeling full or bloated.   Pain in the lower abdomen.  Not feeling relief after having a bowel movement. DIAGNOSIS  Your caregiver will take a medical history and perform a physical exam. Further testing may be done for severe constipation. Some tests may include:   A barium enema X-ray to examine your rectum, colon, and sometimes, your small intestine.  A sigmoidoscopy to examine your lower colon.  A colonoscopy to examine your entire colon. TREATMENT  Treatment will depend on the severity of your constipation and what is causing it. Some dietary treatments include drinking more fluids and eating more  fiber-rich foods. Lifestyle treatments may include regular exercise. If these diet and lifestyle recommendations do not help, your caregiver may recommend taking over-the-counter laxative medicines to help you have bowel movements. Prescription medicines may be prescribed if over-the-counter medicines do not work.  HOME CARE INSTRUCTIONS   Increase dietary fiber in your diet, such as fruits, vegetables, whole grains, and beans. Limit high-fat and processed sugars in your diet, such as [place] fries, hamburgers, cookies, candies, and soda.   A fiber supplement may be added to your diet if you cannot get enough fiber from foods.   Drink enough fluids to keep your urine clear or pale yellow.   Exercise regularly or as directed by your caregiver.   Go to the restroom when you have the urge to go. Do not hold it.  Only take medicines as directed by your caregiver. Do not take other medicines for constipation without talking to your caregiver first. SEEK IMMEDIATE MEDICAL CARE IF:   You have bright red blood in your stool.   Your constipation lasts for more than 4 days or gets worse.   You have abdominal or rectal pain.   You have thin, pencil-like stools.  You have unexplained weight loss. MAKE SURE YOU:   Understand these instructions.  Will watch your condition.  Will get help right away if you are not doing well or get worse. Document Released: 07/11/2004 Document Revised: 01/05/2012 Document Reviewed: 09/16/2011 [facility] Patient Information 2014 [place], [place].

## 2013-06-24 ENCOUNTER — Ambulatory Visit: Payer: Medicare Other | Admitting: Family Medicine

## 2013-06-26 ENCOUNTER — Encounter: Payer: Self-pay | Admitting: Family Medicine

## 2013-06-26 DIAGNOSIS — Z0000 Encounter for general adult medical examination without abnormal findings: Secondary | ICD-10-CM

## 2013-06-26 HISTORY — DX: Encounter for general adult medical examination without abnormal findings: Z00.00

## 2013-06-26 NOTE — Assessment & Plan Note (Signed)
Did not tolerate PT, is doing well at home with the help of his wife. No further therapy at this time. Will need to continue using assistive devices such as cane and or walker.

## 2013-06-26 NOTE — Assessment & Plan Note (Signed)
Follows with vascular surgeon has been slowly enlarging. asymptomatic

## 2013-06-26 NOTE — Assessment & Plan Note (Signed)
Doing well in his home with the help of his wife. They are managing his ADLs well, no depression or recent illness. No falls or anorexia. Encouraged heart healthy diet, movement as tolerated and adequate sleep

## 2013-06-26 NOTE — Assessment & Plan Note (Signed)
Well controlled, no changes today 

## 2013-06-26 NOTE — Assessment & Plan Note (Signed)
Glucose wnl today. Minimize simple carbs.  

## 2013-07-20 ENCOUNTER — Encounter (INDEPENDENT_AMBULATORY_CARE_PROVIDER_SITE_OTHER): Payer: Medicare Other | Admitting: *Deleted

## 2013-07-20 ENCOUNTER — Encounter: Payer: Self-pay | Admitting: Family

## 2013-07-20 ENCOUNTER — Ambulatory Visit (INDEPENDENT_AMBULATORY_CARE_PROVIDER_SITE_OTHER): Payer: Medicare Other | Admitting: Family

## 2013-07-20 VITALS — BP 115/74 | HR 82 | Resp 16 | Ht 76.0 in | Wt 214.0 lb

## 2013-07-20 DIAGNOSIS — I714 Abdominal aortic aneurysm, without rupture: Secondary | ICD-10-CM

## 2013-07-20 DIAGNOSIS — I6529 Occlusion and stenosis of unspecified carotid artery: Secondary | ICD-10-CM

## 2013-07-20 NOTE — Patient Instructions (Addendum)
Abdominal Aortic Aneurysm  An aneurysm is the enlargement (dilatation), bulging, or ballooning out of part of the wall of a vein or artery. An aortic aneurysm is a bulging in the largest artery of the body. This artery supplies blood from the heart to the rest of the body.  The first part of the aorta is called the thoracic aorta. It leaves the heart, rises (ascends), arches, and goes down (descends) through the chest until it reaches the diaphragm. The diaphragm is the muscular part between the chest and abdomen.  The second part of the aorta is called the abdominal aorta after it has passed the diaphragm and continues down through the abdomen. The abdominal aorta ends where it splits to form the two iliac arteries that go to the legs. Aortic aneurysms can develop anywhere along the length of the aorta. The majority are located along the abdominal aorta. The major concern with an aortic aneurysm is that it can enlarge and rupture. This can cause death unless diagnosed and treated promptly. Aneurysms can also develop blood clots or infections. CAUSES  Many aortic aneurysms are caused by arteriosclerosis. Arteriosclerosis can weaken the aortic wall. The pressure of the blood being pumped through the aorta causes it to balloon out at the site of weakness. Therefore, high blood pressure (hypertension) is associated with aneurysm. Other risk factors include:  Age over 60.  Tobacco use.  Being male.  White race.  Family history of aneurysm.  Less frequent causes of abdominal aortic aneurysms include:  Connective tissue diseases.  Abdominal trauma.  Inflammation of blood vessles (arteritis).  Inherited (congenital) malformations.  Infection. SYMPTOMS  The signs and symptoms of an unruptured aneurysm will partly depend on its size and rate of growth.   Abdominal aortic aneurysms may cause pain. The pain typically has a deep quality as if it is piercing into the person. It is felt most  often in the lower back area. The pain is usually steady but may be relieved by changing your body position.  The person may also become aware of an abnormally prominent pulse in the belly (abdominal pulsation). DIAGNOSIS  An aortic aneurysm may be discovered by chance on physical exam, or on X-ray studies done for other reasons. It may be suspected because of other problems such as back or abdominal pain. The following tests may help identify the problem.  X-rays of the abdomen can show calcium deposits in the aneurysm wall.  CT scanning of the abdomen, particularly with contrast medium, is accurate at showing the exact size and shape of the aneurysm.  Ultrasounds give a clear picture of the size of an aneurysm (about 98% accuracy).  MRI scanning is accurate, but often unnecessary.  An abdominal angiogram shows the source of the major blood vessels arising from the aorta. It reveals the size and extent of any aneurysm. It can also show a clot clinging to the wall of the aneurysm (mural thrombus). TREATMENT  Treating an abdominal aortic aneurysm depends on the size. A rupture of an aneurysm is uncommon when they are less than 5 cm wide (2 inches). Rupture is far more common in aneurysms that are over 6 cm wide (2.4 inches).  Surgical repair is usually recommended for all aneurysms over 6 cm wide (2.4 inches). This depends on the health, age, and other circumstances of the individual. This type of surgery consists of opening the abdomen, removing the aneurysm, and sewing a synthetic graft (similar to a cloth tube) in its place. A   less invasive form of this surgery, using stent grafts, is sometimes recommended.  For most patients, elective repair is recommended for aneurysms between 4 and 6 cm (1.6 and 2.4 inches). Elective means the surgery can be done at your convenience. This should not be put off too long if surgery is recommended.  If you smoke, stop immediately. Smoking is a major risk  factor for enlargement and rupture.  Medications may be used to help decrease complications  these include medicine to lower blood pressure and control cholesterol. HOME CARE INSTRUCTIONS   If you smoke, stop. Do not start smoking.  Take all medications as prescribed.  Your caregiver will tell you when to have your aneurysm rechecked, either by ultrasound or CT scan.  If your caregiver has given you a follow-up appointment, it is very important to keep that appointment. Not keeping the appointment could result in a chronic or permanent injury, pain, or disability. If there is any problem keeping the appointment, you must call back to this facility for assistance. SEEK MEDICAL CARE IF:   You develop mild abdominal pain or pressure.  You are able to feel or perceive your aneurysm, and you sense any change. SEEK IMMEDIATE MEDICAL CARE IF:   You develop severe abdominal pain, or severe pain moving (radiating) to your back.  You suddenly develop cold or blue toes or feet.  You suddenly develop lightheadedness or fainting spells. MAKE SURE YOU:   Understand these instructions.  Will watch your condition.  Will get help right away if you are not doing well or get worse. Document Released: 07/23/2005 Document Revised: 01/05/2012 Document Reviewed: 05/16/2008 [employer] Patient Information 2014 [employer] Stroke Prevention Some medical conditions and behaviors are associated with an increased chance of having a stroke. You may prevent a stroke by making healthy choices and managing medical conditions. Reduce your risk of having a stroke by:  Staying physically active. Get at least 30 minutes of activity on most or all days.  Not smoking. It may also be helpful to avoid exposure to secondhand smoke.  Limiting alcohol use. Moderate alcohol use is considered to be:  No more than 2 drinks per day for men.  No more than 1 drink per day for nonpregnant women.  Eating healthy  foods.  Include 5 or more servings of fruits and vegetables a day.  Certain diets may be prescribed to address high blood pressure, high cholesterol, diabetes, or obesity.  Managing your cholesterol levels.  A low-saturated fat, low-trans fat, low-cholesterol, and high-fiber diet may control cholesterol levels.  Take any prescribed medicines to control cholesterol as directed by your caregiver.  Managing your diabetes.  A controlled-carbohydrate, controlled-sugar diet is recommended to manage diabetes.  Take any prescribed medicines to control diabetes as directed by your caregiver.  Controlling your high blood pressure (hypertension).  A low-salt (sodium), low-saturated fat, low-trans fat, and low-cholesterol diet is recommended to manage high blood pressure.  Take any prescribed medicines to control hypertension as directed by your caregiver.  Maintaining a healthy weight.  A reduced-calorie, low-sodium, low-saturated fat, low-trans fat, low-cholesterol diet is recommended to manage weight.  Stopping drug abuse.  Avoiding birth control pills.  Talk to your caregiver about the risks of taking birth control pills if you are over 35 years old, smoke, get migraines, or have ever had a blood clot.  Getting evaluated for sleep disorders (sleep apnea).  Talk to your caregiver about getting a sleep evaluation if you snore a lot or have   excessive sleepiness.  Taking medicines as directed by your caregiver.  For some people, aspirin or blood thinners (anticoagulants) are helpful in reducing the risk of forming abnormal blood clots that can lead to stroke. If you have the irregular heart rhythm of atrial fibrillation, you should be on a blood thinner unless there is a good reason you cannot take them.  Understand all your medicine instructions. SEEK IMMEDIATE MEDICAL CARE IF:   You have sudden weakness or numbness of the face, arm, or leg, especially on one side of the  body.  You have sudden confusion.  You have trouble speaking (aphasia) or understanding.  You have sudden trouble seeing in one or both eyes.  You have sudden trouble walking.  You have dizziness.  You have a loss of balance or coordination.  You have a sudden, severe headache with no known cause.  You have new chest pain or an irregular heartbeat. Any of these symptoms may represent a serious problem that is an emergency. Do not wait to see if the symptoms will go away. Get medical help right away. Call your local emergency services (911 in [place] Do not drive yourself to the hospital. Document Released: 11/20/2004 Document Revised: 01/05/2012 Document Reviewed: 06/02/2011 [employer] Patient Information 2014 [employer].  

## 2013-07-20 NOTE — Progress Notes (Signed)
[facility] [place]  Established Abdominal Aortic Aneurysm  History of Present Illness  Shawn Cortez is a 79 y.o. (04/05/1925) male patient of Dr. Edilia Bo followed for known AAA.  Patient denies claudication in legs but uses his walker since he has lost his sense of balance. He has tingling and numbness in both hands which he reports has been attributed to c-spine issues. Patient denies claudication, denies non-healing wounds. States he has tinnitus for 30 years. Previous studies demonstrate an AAA, measuring 4.2 cm.   The patient does not have back or abdominal pain.  He takes Plavix for history of TIA. Is no longer taking furosemide as it did not the edema in his feet.  Pt Diabetic: No Pt smoker: former smoker, quit 1961  Past Medical History  Diagnosis Date  . Arthritis   . SOB (shortness of breath) on exertion   . Hyperlipidemia   . Stroke     TIA history 2005  . Hyperglycemia 01/04/2012  . Leg pain, right 03/01/2012  . Right leg weakness 03/01/2012  . Skin lesion of right arm 09/14/2012  . Abrasion of hand 09/14/2012  . Cerumen impaction 09/14/2012  . Unsteady gait 12/26/2012  . Insomnia 12/26/2012  . Bilateral groin pain 02/13/2013    L>R  . Peripheral edema 03/29/2013  . Preventative health care 06/26/2013   Past Surgical History  Procedure Laterality Date  . Thumb tendon severed and repaired      right, tendon snapped  . Carpal tunnel release      Right  . Achilles tendon repair      left, repaired w/ wire  . Brain surgery      done by Dr Peter Garter for tinnitius, unsuccessful, only on right  . Inguinal herniorrhapy  years ago    right and then 2 on left  . Lumbar laminectomy      L3-L5 posterior laminectomy 2006  . Joint replacement      Bilateral total knee replacement   Social History History   Social History  . Marital Status: Married    Spouse Name: N/A    Number of Children: N/A  . Years of Education: N/A   Occupational History   . Not on file.   Social History Main Topics  . Smoking status: Former Smoker -- 3.00 packs/day    Quit date: 10/16/1960  . Smokeless tobacco: Never Used  . Alcohol Use: 8.4 oz/week    14 Cans of beer per week     Comment: 1-2 beer daily  . Drug Use: No  . Sexual Activity: Not on file   Other Topics Concern  . Not on file   Social History Narrative  . No narrative on file   Family History Family History  Problem Relation Age of Onset  . Other Mother     CHF  . Ulcers Mother     Venous stasis  . Other Father     CHF  . Cancer Sister     unknown  . Hip fracture Maternal Grandmother   . Pneumonia Maternal Grandmother     Current Outpatient Prescriptions on File Prior to Visit  Medication Sig Dispense Refill  . clopidogrel (PLAVIX) 75 MG tablet Take 1 tablet (75 mg total) by mouth at bedtime.  90 tablet  1  . fish oil-omega-3 fatty acids 1000 MG capsule Take 500 mg by mouth daily. 2 daily       . furosemide (LASIX) 20 MG tablet Take 1 tablet (  20 mg total) by mouth daily as needed (1 daily for 5 days and then as needed).  30 tablet  1  . LORazepam (ATIVAN) 0.5 MG tablet Take 1 tablet (0.5 mg total) by mouth at bedtime as needed for anxiety.  90 tablet  1  . methylPREDNISolone (MEDROL DOSEPAK) 4 MG tablet Follow package directions  21 tablet  1  . simvastatin (ZOCOR) 40 MG tablet TAKE 1 TABLET (40 MG TOTAL) BY MOUTH AT BEDTIME.  90 tablet  1   No current facility-administered medications on file prior to visit.   Allergies  Allergen Reactions  . Niacin And Related     Facial redness  . Omnipaque [Iohexol]     Facial redness and flushing    ROS: [x]  Positive   [ ]  Negative   [ ]  All sytems reviewed and are negative  General: [ ]  Weight loss, [ ]  Fever, [ ]  chills Neurologic: [ ]  Dizziness, [ ]  Blackouts, [ ]  Seizure. Positive for poor balance. [ ]  Stroke, [ ]  "Mini stroke", [ ]  Slurred speech, [ ]  Temporary blindness; [ ]  weakness in arms or legs, [ ]   Hoarseness Cardiac: [ ]  Chest pain/pressure, [ ]  Shortness of breath at rest [ ]  Shortness of breath with exertion, [ ]  Atrial fibrillation or irregular heartbeat Vascular: [ ]  Pain in legs with walking, [ ]  Pain in legs at rest, [ ]  Pain in legs at night,  [ ]  Non-healing ulcer, [ ]  Blood clot in vein/DVT,   Pulmonary: [ ]  Home oxygen, [ ]  Productive cough, [ ]  Coughing up blood, [ ]  Asthma,  [ ]  Wheezing Musculoskeletal:  [ ]  Arthritis, [ ]  Low back pain, [ ]  Joint pain Hematologic: [ ]  Easy Bruising, [ ]  Anemia; [ ]  Hepatitis Gastrointestinal: [ ]  Blood in stool, [ ]  Gastroesophageal Reflux/heartburn, [ ]  Trouble swallowing Urinary: [ ]  chronic Kidney disease, [ ]  on HD - [ ]  MWF or [ ]  TTHS, [ ]  Burning with urination, [ ]  Difficulty urinating Skin: [ ]  Rashes, [ ]  Wounds Psychological: [ ]  Anxiety, [ ]  Depression  Physical Examination  Filed Vitals:   07/20/13 1013  BP: 115/74  Pulse: 82  Resp: 16   Filed Weights   07/20/13 1013  Weight: 214 lb (97.07 kg)   Body mass index is 26.06 kg/(m^2).  General: A&O x 3, WD.  Pulmonary: Sym exp, good air movt, CTAB, no rales, rhonchi, & wheezing.  Cardiac: RRR, Nl S1, S2, no Murmurs, rubs or gallops  Carotid Bruits Left Right   Negative Negative                             VASCULAR EXAM:                                                                                                         LE Pulses LEFT RIGHT       FEMORAL   palpable   palpable        POPLITEAL  not palpable   not palpable       POSTERIOR TIBIAL   palpable    palpable        DORSALIS PEDIS      ANTERIOR TIBIAL  palpable    palpable      Gastrointestinal: soft, NTND, -G/R, - HSM, - masses, - CVAT B.  Musculoskeletal: M/S 5/5 throughout, extremities without ischemic changes.  Neurologic: CN 2-12 intact except hard of hearing, Pain and light touch intact in extremities, Motor exam as listed above  Non-Invasive Vascular Imaging  AAA Duplex  (07/20/2013)  Previous size: 4.2 cm (Date: 07/23/2011)  Current size:  4.2 cm (Date: 07/20/2013)   07/23/11 CTA:  The infrarenal abdominal aortic aneurysm measures 4.7 x 4.2 cm  compared 4.1 x 4.6 cm previously, stable. Bilateral common iliac  artery aneurysms, 2.4 cm on the right and 2.5 cm on the left, both  stable. Aorta and iliac vessels are heavily calcified.  Moderate stool throughout the colon. Appendix is visualized and is  normal. Small bowel is decompressed. No free fluid, free air or  adenopathy.  No acute bony abnormality. Degenerative changes in the lumbar  spine.  IMPRESSION:  4.7 cm infrarenal abdominal aortic aneurysm, not significantly  changed since prior study. Stable common iliac artery aneurysms.  Medical Decision Making  The patient is a 79 y.o. male who presents with: asymptomatic AAA with no increase in size.   Based on this patient's exam and diagnostic studies, he needs carotid Duplex and aorto-iliac Duplex in 1 year.  The threshold for repair is AAA size > 5.5 cm, growth > 1 cm/yr, and symptomatic status.  I emphasized the importance of maximal medical management including strict control of blood pressure, blood glucose, and lipid levels, antiplatelet agents, obtaining regular exercise, and continued cessation of smoking.   The patient was given information about AAA including signs, symptoms, treatment, and how to minimize the risk of enlargement and rupture of aneurysms.  The patient was advised to call 911 should the patient experience sudden onset abdominal or back pain.   Thank you for allowing [place] to participate in this patient's care.  Charisse March, RN, MSN, FNP-C [facility] [place] Office: [phone]  Clinic Physician: Edilia Bo  07/20/2013, 9:40 AM

## 2013-07-27 NOTE — Addendum Note (Signed)
Addended by: Sharee Pimple on: 07/27/2013 01:23 PM   Modules accepted: Orders

## 2013-08-22 ENCOUNTER — Telehealth: Payer: Self-pay

## 2013-08-22 NOTE — Telephone Encounter (Signed)
Patient called and left a message stating that he received a claim "that is all messed up".  i called and informed pt to call the billing office [phone]

## 2013-08-25 ENCOUNTER — Other Ambulatory Visit: Payer: Self-pay | Admitting: *Deleted

## 2013-08-25 ENCOUNTER — Telehealth: Payer: Self-pay | Admitting: *Deleted

## 2013-08-25 DIAGNOSIS — G4700 Insomnia, unspecified: Secondary | ICD-10-CM

## 2013-08-25 MED ORDER — LORAZEPAM 0.5 MG PO TABS: 0.5000 mg | ORAL_TABLET | Freq: Every evening | ORAL | Status: DC | PRN

## 2013-08-25 MED ORDER — CLOPIDOGREL BISULFATE 75 MG PO TABS: 75.0000 mg | ORAL_TABLET | Freq: Every day | ORAL | Status: DC

## 2013-08-25 NOTE — Telephone Encounter (Signed)
RX faxed

## 2013-08-25 NOTE — Telephone Encounter (Signed)
Ok to refill, same sig, #90 no rf

## 2013-08-25 NOTE — Telephone Encounter (Signed)
[facility] request for refill on Lorazepam Last filled - 04.16.14, #90x1 Last AEX - 08.28.14 Next AEX - 3 Months Please Advise/SLS

## 2013-08-25 NOTE — Telephone Encounter (Signed)
Rx request to pharmacy/SLS  

## 2013-08-29 NOTE — Telephone Encounter (Deleted)
Rx was to be faxed to

## 2013-08-31 ENCOUNTER — Telehealth: Payer: Self-pay | Admitting: Family Medicine

## 2013-08-31 MED ORDER — LORAZEPAM 0.5 MG PO TABS: 0.5000 mg | ORAL_TABLET | Freq: Every evening | ORAL | Status: DC | PRN

## 2013-08-31 MED ORDER — CLOPIDOGREL BISULFATE 75 MG PO TABS: 75.0000 mg | ORAL_TABLET | Freq: Every day | ORAL | Status: DC

## 2013-08-31 NOTE — Telephone Encounter (Signed)
Patient called RE: Lorazepam & Plavix medication request to [facility] on 10.30.14; Rx incorrectly sent to [facility]; reordered both to [facility] [refaxed print script Lorazepam]. Patient informed, understood/SLS

## 2013-08-31 NOTE — Addendum Note (Signed)
Addended by: Regis Bill on: 08/31/2013 05:28 PM   Modules accepted: Orders, Medications

## 2013-09-01 ENCOUNTER — Other Ambulatory Visit: Payer: Self-pay

## 2013-09-06 ENCOUNTER — Telehealth: Payer: Self-pay | Admitting: Family Medicine

## 2013-09-06 NOTE — Telephone Encounter (Signed)
Patient informed that we received correction fax from [facility] & this has been faxed to pharmacy/SLS

## 2013-09-06 NOTE — Telephone Encounter (Signed)
Patient states that the directions for plavix and lorazepam were called in wrong to [facility].? He would like a callback regarding this.

## 2013-10-25 ENCOUNTER — Ambulatory Visit (INDEPENDENT_AMBULATORY_CARE_PROVIDER_SITE_OTHER): Payer: Medicare Other | Admitting: Family Medicine

## 2013-10-25 ENCOUNTER — Encounter: Payer: Self-pay | Admitting: Family Medicine

## 2013-10-25 VITALS — BP 130/72 | HR 103 | Temp 98.0°F | Ht 76.0 in | Wt 211.1 lb

## 2013-10-25 DIAGNOSIS — I714 Abdominal aortic aneurysm, without rupture: Secondary | ICD-10-CM

## 2013-10-25 DIAGNOSIS — E785 Hyperlipidemia, unspecified: Secondary | ICD-10-CM

## 2013-10-25 DIAGNOSIS — G609 Hereditary and idiopathic neuropathy, unspecified: Secondary | ICD-10-CM

## 2013-10-25 DIAGNOSIS — R269 Unspecified abnormalities of gait and mobility: Secondary | ICD-10-CM

## 2013-10-25 DIAGNOSIS — R609 Edema, unspecified: Secondary | ICD-10-CM

## 2013-10-25 NOTE — Patient Instructions (Signed)

## 2013-10-25 NOTE — Progress Notes (Signed)
Patient ID: Shawn Cortez, male   DOB: 1925/08/05, 79 y.o.   MRN: [id number] Shawn Cortez [id number] 03-25-1925 10/25/2013      Progress Note-Follow Up  Subjective  Chief Complaint  Chief Complaint  Patient presents with  . Follow-up    3 month    HPI  Patient is a 79 year old Caucasian male in today accompanied by his wife. He recently struggled with upper respiratory infection but is doing better after taking some Mucinex and flank pain. Mild congestion is noted. No cough or fever. No headache, chest pain, palpitations or shortness of breath. Continues to struggle with unsteady gait but no recent falls.  Past Medical History  Diagnosis Date  . Arthritis   . SOB (shortness of breath) on exertion   . Hyperlipidemia   . Stroke     TIA history 2005  . Hyperglycemia 01/04/2012  . Leg pain, right 03/01/2012  . Right leg weakness 03/01/2012  . Skin lesion of right arm 09/14/2012  . Abrasion of hand 09/14/2012  . Cerumen impaction 09/14/2012  . Unsteady gait 12/26/2012  . Insomnia 12/26/2012  . Bilateral groin pain 02/13/2013    L>R  . Peripheral edema 03/29/2013  . Preventative health care 06/26/2013  . AAA (abdominal aortic aneurysm)     Past Surgical History  Procedure Laterality Date  . Thumb tendon severed and repaired      right, tendon snapped  . Carpal tunnel release      Right  . Achilles tendon repair      left, repaired w/ wire  . Brain surgery      done by Dr Peter Garter for tinnitius, unsuccessful, only on right  . Inguinal herniorrhapy  years ago    right and then 2 on left  . Lumbar laminectomy      L3-L5 posterior laminectomy 2006  . Joint replacement      Bilateral total knee replacement    Family History  Problem Relation Age of Onset  . Other Mother     CHF  . Ulcers Mother     Venous stasis  . Other Father     CHF  . Cancer Sister     unknown  . Hip fracture Maternal Grandmother   . Pneumonia Maternal Grandmother     History   Social  History  . Marital Status: Married    Spouse Name: N/A    Number of Children: N/A  . Years of Education: N/A   Occupational History  . Not on file.   Social History Main Topics  . Smoking status: Former Smoker -- 3.00 packs/day    Quit date: 10/16/1960  . Smokeless tobacco: Never Used  . Alcohol Use: 8.4 oz/week    14 Cans of beer per week     Comment: 1-2 beer daily  . Drug Use: No  . Sexual Activity: Not on file   Other Topics Concern  . Not on file   Social History Narrative  . No narrative on file    Current Outpatient Prescriptions on File Prior to Visit  Medication Sig Dispense Refill  . clopidogrel (PLAVIX) 75 MG tablet Take 1 tablet (75 mg total) by mouth at bedtime.  90 tablet  1  . fish oil-omega-3 fatty acids 1000 MG capsule Take 500 mg by mouth daily. 2 daily       . LORazepam (ATIVAN) 0.5 MG tablet Take 1 tablet (0.5 mg total) by mouth at bedtime as needed for anxiety.  90 tablet  0  . simvastatin (ZOCOR) 40 MG tablet TAKE 1 TABLET (40 MG TOTAL) BY MOUTH AT BEDTIME.  90 tablet  1   No current facility-administered medications on file prior to visit.    Allergies  Allergen Reactions  . Niacin And Related Rash    Facial redness and Rash head to toe  . Omnipaque [Iohexol] Rash    Facial redness and flushing    Review of Systems  Review of Systems  Constitutional: Negative for fever and malaise/fatigue.  HENT: Negative for congestion.   Eyes: Negative for discharge.  Respiratory: Negative for shortness of breath.   Cardiovascular: Negative for chest pain, palpitations and leg swelling.  Gastrointestinal: Negative for nausea, abdominal pain and diarrhea.  Genitourinary: Negative for dysuria.  Musculoskeletal: Negative for falls.  Skin: Negative for rash.  Neurological: Negative for loss of consciousness and headaches.  Endo/Heme/Allergies: Negative for polydipsia.  Psychiatric/Behavioral: Negative for depression and suicidal ideas. The patient is not  nervous/anxious and does not have insomnia.     Objective  BP 130/72  Pulse 103  Temp(Src) 98 F (36.7 C) (Oral)  Ht 6\' 4"  (1.93 m)  Wt 211 lb 1.3 oz (95.745 kg)  BMI 25.70 kg/m2  SpO2 95%  Physical Exam  Physical Exam  Constitutional: He is oriented to person, place, and time and well-developed, well-nourished, and in no distress. No distress.  HENT:  Head: Normocephalic and atraumatic.  Eyes: Conjunctivae are normal.  Neck: Neck supple. No thyromegaly present.  Cardiovascular: Normal rate, regular rhythm and normal heart sounds.   No murmur heard. Pulmonary/Chest: Effort normal and breath sounds normal. No respiratory distress.  Abdominal: He exhibits no distension and no mass. There is no tenderness.  Musculoskeletal: He exhibits no edema.  Neurological: He is alert and oriented to person, place, and time.  Skin: Skin is warm.  Psychiatric: Memory, affect and judgment normal.    Lab Results  Component Value Date   TSH 1.62 06/17/2013   Lab Results  Component Value Date   WBC 6.8 06/17/2013   HGB 13.7 06/17/2013   HCT 40.7 06/17/2013   MCV 92.2 06/17/2013   PLT 207.0 06/17/2013   Lab Results  Component Value Date   CREATININE 1.0 06/17/2013   BUN 18 06/17/2013   NA 135 06/17/2013   K 4.1 06/17/2013   CL 102 06/17/2013   CO2 27 06/17/2013   Lab Results  Component Value Date   ALT 13 06/17/2013   AST 20 06/17/2013   ALKPHOS 64 06/17/2013   BILITOT 0.9 06/17/2013   Lab Results  Component Value Date   CHOL 128 06/17/2013   Lab Results  Component Value Date   HDL 47.00 06/17/2013   Lab Results  Component Value Date   LDLCALC 59 06/17/2013   Lab Results  Component Value Date   TRIG 108.0 06/17/2013   Lab Results  Component Value Date   CHOLHDL 3 06/17/2013     Assessment & Plan  ABDOMINAL AORTIC ANEURYSM Is following with vascular for routine surveillance  UNSTEADY GAIT No recent falls, is using assistive devices routinely now.    HYPERLIPIDEMIA Tolerating Simvastatin, no changes  Peripheral edema No recent flares  PERIPHERAL NEUROPATHY Long standing, stable

## 2013-10-25 NOTE — Progress Notes (Signed)
Pre visit review using our clinic review tool, if applicable. No additional management support is needed unless otherwise documented below in the visit note. 

## 2013-10-31 ENCOUNTER — Encounter: Payer: Self-pay | Admitting: Family Medicine

## 2013-10-31 NOTE — Assessment & Plan Note (Signed)
Long standing, stable 

## 2013-10-31 NOTE — Assessment & Plan Note (Signed)
Is following with vascular for routine surveillance

## 2013-10-31 NOTE — Assessment & Plan Note (Signed)
Tolerating Simvastatin, no changes 

## 2013-10-31 NOTE — Assessment & Plan Note (Signed)
No recent flares 

## 2013-10-31 NOTE — Assessment & Plan Note (Signed)
No recent falls, is using assistive devices routinely now.

## 2013-12-27 ENCOUNTER — Other Ambulatory Visit: Payer: Self-pay | Admitting: Family Medicine

## 2014-02-08 NOTE — Telephone Encounter (Signed)
Opened in error

## 2014-02-28 ENCOUNTER — Encounter: Payer: Self-pay | Admitting: Family Medicine

## 2014-02-28 ENCOUNTER — Ambulatory Visit (INDEPENDENT_AMBULATORY_CARE_PROVIDER_SITE_OTHER): Payer: Medicare HMO | Admitting: Family Medicine

## 2014-02-28 ENCOUNTER — Encounter: Payer: Medicare Other | Admitting: Family Medicine

## 2014-02-28 VITALS — BP 102/62 | HR 102 | Temp 97.5°F | Ht 76.0 in | Wt 212.1 lb

## 2014-02-28 DIAGNOSIS — R739 Hyperglycemia, unspecified: Secondary | ICD-10-CM

## 2014-02-28 DIAGNOSIS — E785 Hyperlipidemia, unspecified: Secondary | ICD-10-CM

## 2014-02-28 DIAGNOSIS — R269 Unspecified abnormalities of gait and mobility: Secondary | ICD-10-CM

## 2014-02-28 DIAGNOSIS — I714 Abdominal aortic aneurysm, without rupture, unspecified: Secondary | ICD-10-CM

## 2014-02-28 DIAGNOSIS — R7309 Other abnormal glucose: Secondary | ICD-10-CM

## 2014-02-28 DIAGNOSIS — R000 Tachycardia, unspecified: Secondary | ICD-10-CM

## 2014-02-28 LAB — CBC
HCT: 40.6 % (ref 39.0–52.0)
Hemoglobin: 13.8 g/dL (ref 13.0–17.0)
MCH: 30.9 pg (ref 26.0–34.0)
MCHC: 34 g/dL (ref 30.0–36.0)
MCV: 90.8 fL (ref 78.0–100.0)
PLATELETS: 196 10*3/uL (ref 150–400)
RBC: 4.47 MIL/uL (ref 4.22–5.81)
RDW: 13.6 % (ref 11.5–15.5)
WBC: 6.6 10*3/uL (ref 4.0–10.5)

## 2014-02-28 LAB — RENAL FUNCTION PANEL
ALBUMIN: 3.8 g/dL (ref 3.5–5.2)
BUN: 19 mg/dL (ref 6–23)
CALCIUM: 8.8 mg/dL (ref 8.4–10.5)
CO2: 25 meq/L (ref 19–32)
CREATININE: 0.95 mg/dL (ref 0.50–1.35)
Chloride: 102 mEq/L (ref 96–112)
GLUCOSE: 104 mg/dL — AB (ref 70–99)
Phosphorus: 3 mg/dL (ref 2.3–4.6)
Potassium: 4.2 mEq/L (ref 3.5–5.3)
Sodium: 135 mEq/L (ref 135–145)

## 2014-02-28 LAB — LIPID PANEL
CHOL/HDL RATIO: 2.9 ratio
CHOLESTEROL: 112 mg/dL (ref 0–200)
HDL: 38 mg/dL — AB (ref >39)
LDL Cholesterol: 55 mg/dL (ref 0–99)
TRIGLYCERIDES: 94 mg/dL (ref <150)
VLDL: 19 mg/dL (ref 0–40)

## 2014-02-28 LAB — HEPATIC FUNCTION PANEL
ALBUMIN: 3.8 g/dL (ref 3.5–5.2)
ALT: 12 U/L (ref 0–53)
AST: 17 U/L (ref 0–37)
Alkaline Phosphatase: 56 U/L (ref 39–117)
Bilirubin, Direct: 0.2 mg/dL (ref 0.0–0.3)
Indirect Bilirubin: 0.5 mg/dL (ref 0.2–1.2)
TOTAL PROTEIN: 6.4 g/dL (ref 6.0–8.3)
Total Bilirubin: 0.7 mg/dL (ref 0.2–1.2)

## 2014-02-28 LAB — TSH: TSH: 1.701 u[IU]/mL (ref 0.350–4.500)

## 2014-02-28 NOTE — Assessment & Plan Note (Signed)
Is scheduled for surveillance imaging in September is asymptomatic

## 2014-02-28 NOTE — Progress Notes (Signed)
Pre visit review using our clinic review tool, if applicable. No additional management support is needed unless otherwise documented below in the visit note. 

## 2014-02-28 NOTE — Assessment & Plan Note (Signed)
No recent falls, increased pain etc. Using walker routinely now with adequate results

## 2014-02-28 NOTE — Assessment & Plan Note (Signed)
minimize simple carbs. Increase exercise as tolerated. Recheck renal panel

## 2014-02-28 NOTE — Patient Instructions (Signed)

## 2014-02-28 NOTE — Progress Notes (Signed)
Patient ID: Shawn Cortez, male   DOB: 1925-10-26, 80 y.o.   MRN: [id number] PHAROAH GOGGINS [id number] 02/11/1925 02/28/2014      Progress Note-Follow Up  Subjective  Chief Complaint  Chief Complaint  Patient presents with  . Follow-up    4 month    HPI  Patient is a 80 year old male in today for routine medical care. Doing well, no recent illness, falls or new concerns. Scheduled for repeat imaging in September of AAA. Asymptomatic, using walker routinely. Denies CP/palp/SOB/HA/congestion/fevers/GI or GU c/o. Taking meds as prescribed  Past Medical History  Diagnosis Date  . Arthritis   . SOB (shortness of breath) on exertion   . Hyperlipidemia   . Stroke     TIA history 2005  . Hyperglycemia 01/04/2012  . Leg pain, right 03/01/2012  . Right leg weakness 03/01/2012  . Skin lesion of right arm 09/14/2012  . Abrasion of hand 09/14/2012  . Cerumen impaction 09/14/2012  . Unsteady gait 12/26/2012  . Insomnia 12/26/2012  . Bilateral groin pain 02/13/2013    L>R  . Peripheral edema 03/29/2013  . Preventative health care 06/26/2013  . AAA (abdominal aortic aneurysm)     Past Surgical History  Procedure Laterality Date  . Thumb tendon severed and repaired      right, tendon snapped  . Carpal tunnel release      Right  . Achilles tendon repair      left, repaired w/ wire  . Brain surgery      done by Dr Lizabeth Leyden for tinnitius, unsuccessful, only on right  . Inguinal herniorrhapy  years ago    right and then 2 on left  . Lumbar laminectomy      L3-L5 posterior laminectomy 2006  . Joint replacement      Bilateral total knee replacement    Family History  Problem Relation Age of Onset  . Other Mother     CHF  . Ulcers Mother     Venous stasis  . Other Father     CHF  . Cancer Sister     unknown  . Hip fracture Maternal Grandmother   . Pneumonia Maternal Grandmother     History   Social History  . Marital Status: Married    Spouse Name: N/A    Number of  Children: N/A  . Years of Education: N/A   Occupational History  . Not on file.   Social History Main Topics  . Smoking status: Former Smoker -- 3.00 packs/day    Quit date: 10/16/1960  . Smokeless tobacco: Never Used  . Alcohol Use: 8.4 oz/week    14 Cans of beer per week     Comment: 1-2 beer daily  . Drug Use: No  . Sexual Activity: Not on file   Other Topics Concern  . Not on file   Social History Narrative  . No narrative on file    Current Outpatient Prescriptions on File Prior to Visit  Medication Sig Dispense Refill  . clopidogrel (PLAVIX) 75 MG tablet Take 1 tablet (75 mg total) by mouth at bedtime.  90 tablet  1  . fish oil-omega-3 fatty acids 1000 MG capsule Take 500 mg by mouth daily. 2 daily       . LORazepam (ATIVAN) 0.5 MG tablet Take 1 tablet (0.5 mg total) by mouth at bedtime as needed for anxiety.  90 tablet  0  . simvastatin (ZOCOR) 40 MG tablet TAKE 1 TABLET (40 MG TOTAL)  BY MOUTH AT BEDTIME.  90 tablet  1   No current facility-administered medications on file prior to visit.    Allergies  Allergen Reactions  . Niacin And Related Rash    Facial redness and Rash head to toe  . Omnipaque [Iohexol] Rash    Facial redness and flushing    Review of Systems  Review of Systems  Constitutional: Positive for malaise/fatigue. Negative for fever.  HENT: Negative for congestion.   Eyes: Negative for pain and discharge.  Respiratory: Negative for shortness of breath.   Cardiovascular: Negative for chest pain, palpitations and leg swelling.  Gastrointestinal: Negative for nausea, abdominal pain and diarrhea.  Genitourinary: Negative for dysuria.  Musculoskeletal: Positive for back pain. Negative for falls.  Skin: Negative for rash.  Neurological: Negative for loss of consciousness and headaches.  Endo/Heme/Allergies: Negative for polydipsia.  Psychiatric/Behavioral: Negative for depression and suicidal ideas. The patient is not nervous/anxious and does not  have insomnia.     Objective  BP 102/62  Pulse 102  Temp(Src) 97.5 F (36.4 C) (Oral)  Ht 6\' 4"  (1.93 m)  Wt 212 lb 1.3 oz (96.199 kg)  BMI 25.83 kg/m2  SpO2 95%  Physical Exam  Physical Exam  Constitutional: He is oriented to person, place, and time and well-developed, well-nourished, and in no distress. No distress.  HENT:  Head: Normocephalic and atraumatic.  Eyes: Conjunctivae are normal.  Neck: Neck supple. No thyromegaly present.  Cardiovascular: Normal rate and regular rhythm.   Murmur heard. 2/6 M  Pulmonary/Chest: Effort normal and breath sounds normal. No respiratory distress.  Abdominal: He exhibits no distension and no mass. There is no tenderness.  Musculoskeletal: He exhibits no edema.  Neurological: He is alert and oriented to person, place, and time.  Skin: Skin is warm.  Psychiatric: Memory, affect and judgment normal.    Lab Results  Component Value Date   TSH 1.62 06/17/2013   Lab Results  Component Value Date   WBC 6.8 06/17/2013   HGB 13.7 06/17/2013   HCT 40.7 06/17/2013   MCV 92.2 06/17/2013   PLT 207.0 06/17/2013   Lab Results  Component Value Date   CREATININE 1.0 06/17/2013   BUN 18 06/17/2013   NA 135 06/17/2013   K 4.1 06/17/2013   CL 102 06/17/2013   CO2 27 06/17/2013   Lab Results  Component Value Date   ALT 13 06/17/2013   AST 20 06/17/2013   ALKPHOS 64 06/17/2013   BILITOT 0.9 06/17/2013   Lab Results  Component Value Date   CHOL 128 06/17/2013   Lab Results  Component Value Date   HDL 47.00 06/17/2013   Lab Results  Component Value Date   LDLCALC 59 06/17/2013   Lab Results  Component Value Date   TRIG 108.0 06/17/2013   Lab Results  Component Value Date   CHOLHDL 3 06/17/2013     Assessment & Plan  HYPERLIPIDEMIA Tolerating statin, encouraged heart healthy diet, avoid trans fats, minimize simple carbs and saturated fats. Increase exercise as tolerated.   Hyperglycemia  minimize simple carbs. Increase exercise as  tolerated. Recheck renal panel  ABDOMINAL AORTIC ANEURYSM Is scheduled for surveillance imaging in September is asymptomatic  UNSTEADY GAIT No recent falls, increased pain etc. Using walker routinely now with adequate results

## 2014-02-28 NOTE — Assessment & Plan Note (Signed)
Tolerating statin, encouraged heart healthy diet, avoid trans fats, minimize simple carbs and saturated fats. Increase exercise as tolerated 

## 2014-03-09 ENCOUNTER — Other Ambulatory Visit: Payer: Self-pay

## 2014-03-09 DIAGNOSIS — G4700 Insomnia, unspecified: Secondary | ICD-10-CM

## 2014-03-09 MED ORDER — LORAZEPAM 0.5 MG PO TABS: 0.5000 mg | ORAL_TABLET | Freq: Every evening | ORAL | Status: DC | PRN

## 2014-03-09 NOTE — Telephone Encounter (Signed)
Last RX was wrote on 08-31-13 quantity 90 with 0 refills  RX printed and put on mds desk to sign and fax to [facility] per pts request.  Pt informed that we would send this in the morning due to Dr Charlett Blake being out of the office in meetings today.  Pt voiced understanding and said this was ok.

## 2014-06-21 ENCOUNTER — Telehealth: Payer: Self-pay | Admitting: *Deleted

## 2014-06-21 DIAGNOSIS — G4700 Insomnia, unspecified: Secondary | ICD-10-CM

## 2014-06-21 NOTE — Telephone Encounter (Signed)
Faxed refill request received from [facility] for Lorazepam [[facility]] Last filled by MD on 05.14.15, #90x0 Last AEX - 05.05.15 Next AEX - 5 Mths. Please Advise on refills/SLS

## 2014-06-21 NOTE — Telephone Encounter (Signed)
OK to refill Lorazepam same strength, same sig, same number no refills

## 2014-06-22 ENCOUNTER — Telehealth: Payer: Self-pay

## 2014-06-22 MED ORDER — LORAZEPAM 0.5 MG PO TABS: 0.5000 mg | ORAL_TABLET | Freq: Every evening | ORAL | Status: DC | PRN

## 2014-06-22 NOTE — Telephone Encounter (Signed)
Rx faxed to pharmacy. LDM 

## 2014-06-22 NOTE — Telephone Encounter (Signed)
RX printed and faxed. 

## 2014-07-14 ENCOUNTER — Other Ambulatory Visit: Payer: Self-pay | Admitting: Family Medicine

## 2014-07-17 ENCOUNTER — Other Ambulatory Visit: Payer: Self-pay

## 2014-07-17 MED ORDER — SIMVASTATIN 40 MG PO TABS: 40.0000 mg | ORAL_TABLET | Freq: Every day | ORAL | Status: DC

## 2014-07-25 ENCOUNTER — Encounter: Payer: Self-pay | Admitting: Family

## 2014-07-26 ENCOUNTER — Encounter: Payer: Self-pay | Admitting: Family

## 2014-07-26 ENCOUNTER — Ambulatory Visit (HOSPITAL_COMMUNITY)
Admission: RE | Admit: 2014-07-26 | Discharge: 2014-07-26 | Disposition: A | Discharge status: Home / Self Care | Payer: Medicare HMO | Source: Ambulatory Visit | Attending: Family | Admitting: Family

## 2014-07-26 ENCOUNTER — Ambulatory Visit (INDEPENDENT_AMBULATORY_CARE_PROVIDER_SITE_OTHER): Payer: Medicare HMO | Admitting: Family

## 2014-07-26 VITALS — BP 121/74 | HR 68 | Resp 16 | Ht 76.0 in | Wt 200.0 lb

## 2014-07-26 DIAGNOSIS — I714 Abdominal aortic aneurysm, without rupture, unspecified: Secondary | ICD-10-CM

## 2014-07-26 NOTE — Progress Notes (Signed)
[facility] [place]  Established Abdominal Aortic Aneurysm  History of Present Illness  Shawn Cortez is a 80 y.o. (04-19-1925) male patient of Dr. Scot Dock followed for known AAA.  He returns today for follow up. Patient denies claudication in legs but uses his walker since he has lost his sense of balance.  He has tingling and numbness in both hands which he reports has been attributed to c-spine issues.  Patient denies claudication, denies non-healing wounds.  States he has tinnitus for 30 years.  Previous studies demonstrate an AAA, measuring 4.2 cm.  The patient does not have back or abdominal pain.  He takes Plavix for history of TIA.  Is no longer taking furosemide as it did not the edema in his feet.  He has tried knee high compression hose but found the too uncomfortable. He takes a daily statin and Plavix.  Pt Diabetic: No  Pt smoker: former smoker, quit 1961   Past Medical History  Diagnosis Date  . Arthritis   . SOB (shortness of breath) on exertion   . Hyperlipidemia   . Stroke     TIA history 2005  . Hyperglycemia 01/04/2012  . Leg pain, right 03/01/2012  . Right leg weakness 03/01/2012  . Skin lesion of right arm 09/14/2012  . Abrasion of hand 09/14/2012  . Cerumen impaction 09/14/2012  . Unsteady gait 12/26/2012  . Insomnia 12/26/2012  . Bilateral groin pain 02/13/2013    L>R  . Peripheral edema 03/29/2013  . Preventative health care 06/26/2013  . AAA (abdominal aortic aneurysm)   . Atrial fibrillation    Past Surgical History  Procedure Laterality Date  . Thumb tendon severed and repaired      right, tendon snapped  . Carpal tunnel release      Right  . Achilles tendon repair      left, repaired w/ wire  . Brain surgery      done by Dr Lizabeth Leyden for tinnitius, unsuccessful, only on right  . Inguinal herniorrhapy  years ago    right and then 2 on left  . Lumbar laminectomy      L3-L5 posterior laminectomy 2006  . Joint replacement       Bilateral total knee replacement  . Spine surgery     Social History History   Social History  . Marital Status: Married    Spouse Name: N/A    Number of Children: N/A  . Years of Education: N/A   Occupational History  . Not on file.   Social History Main Topics  . Smoking status: Former Smoker -- 3.00 packs/day    Quit date: 10/16/1960  . Smokeless tobacco: Never Used  . Alcohol Use: 8.4 oz/week    14 Cans of beer per week     Comment: 1-2 beer daily  . Drug Use: No  . Sexual Activity: Not on file   Other Topics Concern  . Not on file   Social History Narrative  . No narrative on file   Family History Family History  Problem Relation Age of Onset  . Other Mother     CHF  . Ulcers Mother     Venous stasis  . Other Father     CHF  . Cancer Sister     unknown  . Hip fracture Maternal Grandmother   . Pneumonia Maternal Grandmother     Current Outpatient Prescriptions on File Prior to Visit  Medication Sig Dispense Refill  . clopidogrel (PLAVIX)  75 MG tablet TAKE 1 TABLET (75 MG TOTAL) BY MOUTH AT BEDTIME.  90 tablet  1  . fish oil-omega-3 fatty acids 1000 MG capsule Take 500 mg by mouth daily. 2 daily       . LORazepam (ATIVAN) 0.5 MG tablet Take 1 tablet (0.5 mg total) by mouth at bedtime as needed for anxiety.  90 tablet  0  . simvastatin (ZOCOR) 40 MG tablet Take 1 tablet (40 mg total) by mouth daily at 6 PM.  90 tablet  1   No current facility-administered medications on file prior to visit.   Allergies  Allergen Reactions  . Niacin And Related Rash    Facial redness and Rash head to toe  . Omnipaque [Iohexol] Rash    Facial redness and flushing    ROS: See HPI for pertinent positives and negatives.  Physical Examination  Filed Vitals:   07/26/14 1119  BP: 121/74  Pulse: 68  Resp: 16  Height: 6\' 4"  (1.93 m)  Weight: 200 lb (90.719 kg)  SpO2: 96%   Body mass index is 24.35 kg/(m^2).  General: A&O x 3, WD.  Pulmonary: Sym exp, good air  movt, CTAB, no rales, rhonchi, & wheezing.  Cardiac: RRR, Nl S1, S2, no Murmur detected.  Carotid Bruits  Left  Right    Negative  Negative    Aorta is not palpable. Radial pulses are 2+ palpable and =.  VASCULAR EXAM:  LE Pulses  LEFT  RIGHT   FEMORAL  palpable  palpable   POPLITEAL  not palpable  not palpable   POSTERIOR TIBIAL  palpable  palpable   DORSALIS PEDIS  ANTERIOR TIBIAL  palpable  palpable    Gastrointestinal: soft, NTND, -G/R, - HSM, - masses palpated, - CVAT B.  Musculoskeletal: M/S 5/5 throughout except right LE is 3/5, extremities without ischemic changes. 1+ pitting edema in right lower leg, 2+ in left lower leg. Neurologic: CN 2-12 intact except hard of hearing, Pain and light touch intact in extremities, Motor exam as listed above  07/23/11 CTA:  The infrarenal abdominal aortic aneurysm measures 4.7 x 4.2 cm  compared 4.1 x 4.6 cm previously, stable. Bilateral common iliac  artery aneurysms, 2.4 cm on the right and 2.5 cm on the left, both  stable. Aorta and iliac vessels are heavily calcified.  Moderate stool throughout the colon. Appendix is visualized and is  normal. Small bowel is decompressed. No free fluid, free air or  adenopathy.  No acute bony abnormality. Degenerative changes in the lumbar  spine.  IMPRESSION:  4.7 cm infrarenal abdominal aortic aneurysm, not significantly  changed since prior study. Stable common iliac artery aneurysms.   Non-Invasive Vascular Imaging  AAA Duplex (07/26/2014) ABDOMINAL AORTA DUPLEX EVALUATION    INDICATION: Follow up abdominal aortic aneurysm     PREVIOUS INTERVENTION(S):     DUPLEX EXAM:     LOCATION DIAMETER AP (cm) DIAMETER TRANSVERSE (cm) VELOCITIES (cm/sec)  Aorta Proximal 1.96 1.94 77  Aorta Mid 4.21 4.21 58  Aorta Distal NOT VISUALIZED  72  Right Common Iliac Artery NOT VISUALIZED    Left Common Iliac Artery NOT VISUALIZED      Previous max aortic diameter:  4.2cm Date: 07/20/2013      ADDITIONAL FINDINGS:     IMPRESSION: 1. Abdominal aortic aneurysm measuring 4.21cm on today's exam. 2. The distal aorta and iliac arteries could not be visualized.     Compared to the previous exam:  No significant change in aortic diameter  compared to previous exam.      Medical Decision Making  The patient is a 80 y.o. male who presents with asymptomatic AAA with no increasing size.   Based on this patient's exam and diagnostic studies, the patient will follow up in 1 year  with the following studies: AAA Duplex.  Consideration for repair of AAA would be made when the size is 5.5 cm, growth > 1 cm/yr, and symptomatic status.  I emphasized the importance of maximal medical management including strict control of blood pressure, blood glucose, and lipid levels, antiplatelet agents, obtaining regular exercise, and contineud cessation of smoking.   The patient was given information about AAA including signs, symptoms, treatment, and how to minimize the risk of enlargement and rupture of aneurysms.    The patient was advised to call 911 should the patient experience sudden onset abdominal or back pain.   Thank you for allowing [place] to participate in this patient's care.  Clemon Chambers, RN, MSN, FNP-C [facility] Office: [phone]  Clinic Physician: Scot Dock  07/26/2014, 11:28 AM

## 2014-07-26 NOTE — Patient Instructions (Addendum)
Abdominal Aortic Aneurysm An aneurysm is a weakened or damaged part of an artery wall that bulges from the normal force of blood pumping through the body. An abdominal aortic aneurysm is an aneurysm that occurs in the lower part of the aorta, the main artery of the body.  The major concern with an abdominal aortic aneurysm is that it can enlarge and burst (rupture) or blood can flow between the layers of the wall of the aorta through a tear (aorticdissection). Both of these conditions can cause bleeding inside the body and can be life threatening unless diagnosed and treated promptly. CAUSES  The exact cause of an abdominal aortic aneurysm is unknown. Some contributing factors are:   A hardening of the arteries caused by the buildup of fat and other substances in the lining of a blood vessel (arteriosclerosis).  Inflammation of the walls of an artery (arteritis).   Connective tissue diseases, such as Marfan syndrome.   Abdominal trauma.   An infection, such as syphilis or staphylococcus, in the wall of the aorta (infectious aortitis) caused by bacteria. RISK FACTORS  Risk factors that contribute to an abdominal aortic aneurysm may include:  Age older than 60 years.   High blood pressure (hypertension).  Male gender.  Ethnicity (white race).  Obesity.  Family history of aneurysm (first degree relatives only).  Tobacco use. PREVENTION  The following healthy lifestyle habits may help decrease your risk of abdominal aortic aneurysm:  Quitting smoking. Smoking can raise your blood pressure and cause arteriosclerosis.  Limiting or avoiding alcohol.  Keeping your blood pressure, blood sugar level, and cholesterol levels within normal limits.  Decreasing your salt intake. In somepeople, too much salt can raise blood pressure and increase your risk of abdominal aortic aneurysm.  Eating a diet low in saturated fats and cholesterol.  Increasing your fiber intake by including  whole grains, vegetables, and fruits in your diet. Eating these foods may help lower blood pressure.  Maintaining a healthy weight.  Staying physically active and exercising regularly. SYMPTOMS  The symptoms of abdominal aortic aneurysm may vary depending on the size and rate of growth of the aneurysm.Most grow slowly and do not have any symptoms. When symptoms do occur, they may include:  Pain (abdomen, side, lower back, or groin). The pain may vary in intensity. A sudden onset of severe pain may indicate that the aneurysm has ruptured.  Feeling full after eating only small amounts of food.  Nausea or vomiting or both.  Feeling a pulsating lump in the abdomen.  Feeling faint or passing out. DIAGNOSIS  Since most unruptured abdominal aortic aneurysms have no symptoms, they are often discovered during diagnostic exams for other conditions. An aneurysm may be found during the following procedures:  Ultrasonography (A one-time screening for abdominal aortic aneurysm by ultrasonography is also recommended for all men aged 65-75 years who have ever smoked).  X-ray exams.  A computed tomography (CT).  Magnetic resonance imaging (MRI).  Angiography or arteriography. TREATMENT  Treatment of an abdominal aortic aneurysm depends on the size of your aneurysm, your age, and risk factors for rupture. Medication to control blood pressure and pain may be used to manage aneurysms smaller than 6 cm. Regular monitoring for enlargement may be recommended by your caregiver if:  The aneurysm is 3-4 cm in size (an annual ultrasonography may be recommended).  The aneurysm is 4-4.5 cm in size (an ultrasonography every 6 months may be recommended).  The aneurysm is larger than 4.5 cm in   size (your caregiver may ask that you be examined by a vascular surgeon). If your aneurysm is larger than 6 cm, surgical repair may be recommended. There are two main methods for repair of an aneurysm:   Endovascular  repair (a minimally invasive surgery). This is done most often.  Open repair. This method is used if an endovascular repair is not possible. Document Released: 07/23/2005 Document Revised: 02/07/2013 Document Reviewed: 11/12/2012 [facility] Patient Information 2015 [place], [place]. This information is not intended to replace advice given to you by your health care provider. Make sure you discuss any questions you have with your health care provider.   Take 2 extra strength Gas-X at bedtime the evening before your next ultrasound, then take another two extra Strength Gas-X about 4 hours before your ultrasound appointment to help minimize abdominal gas.

## 2014-07-26 NOTE — Addendum Note (Signed)
Addended by: Mena Goes on: 07/26/2014 04:23 PM   Modules accepted: Orders

## 2014-07-31 ENCOUNTER — Encounter: Payer: Self-pay | Admitting: Family Medicine

## 2014-07-31 ENCOUNTER — Ambulatory Visit (INDEPENDENT_AMBULATORY_CARE_PROVIDER_SITE_OTHER): Payer: Medicare HMO | Admitting: Family Medicine

## 2014-07-31 VITALS — BP 107/51 | HR 87 | Temp 97.8°F | Ht 76.0 in | Wt 216.0 lb

## 2014-07-31 DIAGNOSIS — E785 Hyperlipidemia, unspecified: Secondary | ICD-10-CM

## 2014-07-31 DIAGNOSIS — R609 Edema, unspecified: Secondary | ICD-10-CM

## 2014-07-31 DIAGNOSIS — I714 Abdominal aortic aneurysm, without rupture, unspecified: Secondary | ICD-10-CM

## 2014-07-31 DIAGNOSIS — R739 Hyperglycemia, unspecified: Secondary | ICD-10-CM

## 2014-07-31 DIAGNOSIS — Z0000 Encounter for general adult medical examination without abnormal findings: Secondary | ICD-10-CM | POA: Insufficient documentation

## 2014-07-31 DIAGNOSIS — Z23 Encounter for immunization: Secondary | ICD-10-CM

## 2014-07-31 LAB — LIPID PANEL
CHOL/HDL RATIO: 3
Cholesterol: 133 mg/dL (ref 0–200)
HDL: 47.8 mg/dL (ref >39.00)
LDL CALC: 63 mg/dL (ref 0–99)
NonHDL: 85.2
TRIGLYCERIDES: 113 mg/dL (ref 0.0–149.0)
VLDL: 22.6 mg/dL (ref 0.0–40.0)

## 2014-07-31 LAB — RENAL FUNCTION PANEL
Albumin: 4.2 g/dL (ref 3.5–5.2)
BUN: 19 mg/dL (ref 6–23)
CALCIUM: 8.9 mg/dL (ref 8.4–10.5)
CO2: 25 meq/L (ref 19–32)
Chloride: 101 mEq/L (ref 96–112)
Creatinine, Ser: 1.1 mg/dL (ref 0.4–1.5)
GFR: 70.97 mL/min (ref >60.00)
Glucose, Bld: 101 mg/dL — ABNORMAL HIGH (ref 70–99)
POTASSIUM: 4.2 meq/L (ref 3.5–5.1)
Phosphorus: 3.3 mg/dL (ref 2.3–4.6)
SODIUM: 135 meq/L (ref 135–145)

## 2014-07-31 LAB — CBC
HCT: 40.7 % (ref 39.0–52.0)
Hemoglobin: 13.9 g/dL (ref 13.0–17.0)
MCHC: 34.1 g/dL (ref 30.0–36.0)
MCV: 92.8 fl (ref 78.0–100.0)
Platelets: 200 10*3/uL (ref 150.0–400.0)
RBC: 4.38 Mil/uL (ref 4.22–5.81)
RDW: 14.4 % (ref 11.5–15.5)
WBC: 6.5 10*3/uL (ref 4.0–10.5)

## 2014-07-31 LAB — HEPATIC FUNCTION PANEL
ALT: 14 U/L (ref 0–53)
AST: 21 U/L (ref 0–37)
Albumin: 4.2 g/dL (ref 3.5–5.2)
Alkaline Phosphatase: 57 U/L (ref 39–117)
BILIRUBIN TOTAL: 1 mg/dL (ref 0.2–1.2)
Bilirubin, Direct: 0 mg/dL (ref 0.0–0.3)
Total Protein: 7.7 g/dL (ref 6.0–8.3)

## 2014-07-31 LAB — HEMOGLOBIN A1C: Hgb A1c MFr Bld: 5.9 % (ref 4.6–6.5)

## 2014-07-31 LAB — TSH: TSH: 1.93 u[IU]/mL (ref 0.35–4.50)

## 2014-07-31 NOTE — Progress Notes (Signed)
Patient ID: Shawn Cortez, male   DOB: [birth year], 80 y.o.   MRN: [id number] Shawn Cortez [id number] May 14, 1925 07/31/2014      Progress Note-Follow Up  Subjective  Chief Complaint  Chief Complaint  Patient presents with  . Annual Exam    medicare wellness  . Injections    flu    HPI  Patient is a 80 year old male in today for routine medical care. In today with his wife feeling well today. Was just seen by vascular surgery for monitoring of his AAA, stable will repeat in 1 year unless symptoms worsen. C/o increased gaseouness lately, eats yogurt daily. C/O constipation, moves his bowels every 3-4 days now. Has to strain, no bloody or tarry stool.  Does not drink 64 oz of clear fluids daily. Denies CP/palp/SOB/HA/congestion/fevers or GU c/o. Taking meds as prescribed  Past Medical History  Diagnosis Date  . Arthritis   . SOB (shortness of breath) on exertion   . Hyperlipidemia   . Stroke     TIA history 2005  . Hyperglycemia 01/04/2012  . Leg pain, right 03/01/2012  . Right leg weakness 03/01/2012  . Skin lesion of right arm 09/14/2012  . Abrasion of hand 09/14/2012  . Cerumen impaction 09/14/2012  . Unsteady gait 12/26/2012  . Insomnia 12/26/2012  . Bilateral groin pain 02/13/2013    L>R  . Peripheral edema 03/29/2013  . Preventative health care 06/26/2013  . AAA (abdominal aortic aneurysm)   . Atrial fibrillation     Past Surgical History  Procedure Laterality Date  . Thumb tendon severed and repaired      right, tendon snapped  . Carpal tunnel release      Right  . Achilles tendon repair      left, repaired w/ wire  . Brain surgery      done by Dr Lizabeth Leyden for tinnitius, unsuccessful, only on right  . Inguinal herniorrhapy  years ago    right and then 2 on left  . Lumbar laminectomy      L3-L5 posterior laminectomy 2006  . Joint replacement      Bilateral total knee replacement  . Spine surgery      Family History  Problem Relation Age of Onset  . Other  Mother     CHF  . Ulcers Mother     Venous stasis  . Other Father     CHF  . Cancer Sister     unknown  . Hip fracture Maternal Grandmother   . Pneumonia Maternal Grandmother     History   Social History  . Marital Status: Married    Spouse Name: N/A    Number of Children: N/A  . Years of Education: N/A   Occupational History  . Not on file.   Social History Main Topics  . Smoking status: Former Smoker -- 3.00 packs/day    Quit date: 10/16/1960  . Smokeless tobacco: Never Used  . Alcohol Use: 8.4 oz/week    14 Cans of beer per week     Comment: 1-2 beer daily  . Drug Use: No  . Sexual Activity: Not on file   Other Topics Concern  . Not on file   Social History Narrative  . No narrative on file    Current Outpatient Prescriptions on File Prior to Visit  Medication Sig Dispense Refill  . clopidogrel (PLAVIX) 75 MG tablet TAKE 1 TABLET (75 MG TOTAL) BY MOUTH AT BEDTIME.  90 tablet  1  .  fish oil-omega-3 fatty acids 1000 MG capsule Take 500 mg by mouth daily. 2 daily       . LORazepam (ATIVAN) 0.5 MG tablet Take 1 tablet (0.5 mg total) by mouth at bedtime as needed for anxiety.  90 tablet  0  . simvastatin (ZOCOR) 40 MG tablet Take 1 tablet (40 mg total) by mouth daily at 6 PM.  90 tablet  1   No current facility-administered medications on file prior to visit.    Allergies  Allergen Reactions  . Niacin And Related Rash    Facial redness and Rash head to toe  . Omnipaque [Iohexol] Rash    Facial redness and flushing    Review of Systems  Review of Systems  Constitutional: Negative for fever and malaise/fatigue.  HENT: Negative for congestion.   Eyes: Negative for discharge.  Respiratory: Negative for shortness of breath.   Cardiovascular: Negative for chest pain, palpitations and leg swelling.  Gastrointestinal: Negative for nausea, abdominal pain and diarrhea.  Genitourinary: Negative for dysuria.  Musculoskeletal: Negative for falls.  Skin: Negative  for rash.  Neurological: Negative for loss of consciousness and headaches.  Endo/Heme/Allergies: Negative for polydipsia.  Psychiatric/Behavioral: Negative for depression and suicidal ideas. The patient is not nervous/anxious and does not have insomnia.     Objective  BP 107/51  Pulse 87  Temp(Src) 97.8 F (36.6 C) (Oral)  Ht 6\' 4"  (1.93 m)  Wt 216 lb (97.977 kg)  BMI 26.30 kg/m2  SpO2 99%  Physical Exam  Physical Exam  Constitutional: He is oriented to person, place, and time and well-developed, well-nourished, and in no distress. No distress.  HENT:  Head: Normocephalic and atraumatic.  Eyes: Conjunctivae are normal.  Neck: Neck supple. No thyromegaly present.  Cardiovascular: Normal rate, regular rhythm and normal heart sounds.   No murmur heard. Pulmonary/Chest: Effort normal and breath sounds normal. No respiratory distress.  Abdominal: He exhibits no distension and no mass. There is no tenderness.  Musculoskeletal: He exhibits no edema.  Neurological: He is alert and oriented to person, place, and time.  Skin: Skin is warm.  Psychiatric: Memory, affect and judgment normal.    Lab Results  Component Value Date   TSH 1.701 02/28/2014   Lab Results  Component Value Date   WBC 6.6 02/28/2014   HGB 13.8 02/28/2014   HCT 40.6 02/28/2014   MCV 90.8 02/28/2014   PLT 196 02/28/2014   Lab Results  Component Value Date   CREATININE 0.95 02/28/2014   BUN 19 02/28/2014   NA 135 02/28/2014   K 4.2 02/28/2014   CL 102 02/28/2014   CO2 25 02/28/2014   Lab Results  Component Value Date   ALT 12 02/28/2014   AST 17 02/28/2014   ALKPHOS 56 02/28/2014   BILITOT 0.7 02/28/2014   Lab Results  Component Value Date   CHOL 112 02/28/2014   Lab Results  Component Value Date   HDL 38* 02/28/2014   Lab Results  Component Value Date   LDLCALC 55 02/28/2014   Lab Results  Component Value Date   TRIG 94 02/28/2014   Lab Results  Component Value Date   CHOLHDL 2.9 02/28/2014     Assessment &  Plan  Medicare annual wellness visit, subsequent Patient denies any difficulties at home. No trouble with ADLs, depression or falls. No recent changes to vision or hearing. Is UTD with immunizations. Is UTD with screening. Discussed Advanced Directives, patient agrees to bring [place] copies of documents if  can. Encouraged heart healthy diet, exercise as tolerated and adequate sleep. Given flu shot today. Sees Dr Doug Sou office for dermatology Follows with opthamology, Dr Ward Chatters and Dr C[facility] Colonoscopy in 2010 will not liekly ned another one. Does have some hearing loss in right ear but declines treatement  Abdominal aortic aneurysm Follows with [facility]. Has been moved to annual screening.   Hyperlipidemia Tolerating statin, encouraged heart healthy diet, avoid trans fats, minimize simple carbs and saturated fats. Increase exercise as tolerated  Hyperglycemia hgba1c acceptable, minimize simple carbs. Increase exercise as tolerated.

## 2014-07-31 NOTE — Patient Instructions (Addendum)
Encouraged increased hydration and fiber in diet. Daily probiotics such as Digestive Advantage or Phillip's Colon Health. If bowels not moving can use MOM 2 tbls po in 4 oz of warm prune juice by mouth every 2-3 days. If no results then repeat in 4 hours with  Dulcolax suppository pr, may repeat again in 4 more hours as needed. Seek care if symptoms worsen. Consider daily Miralax and/or Dulcolax if symptoms persist.      Consider a fiber supplement once to twice daily such as Benefiber or Metamucil  Witch Hazel Astringent to clean 64 oz fluids daily  Try Kegel exercises set of 10 twice    Dentist Dr Lorenda Cahill in [place]    Constipation Constipation is when a person has fewer than three bowel movements a week, has difficulty having a bowel movement, or has stools that are dry, hard, or larger than normal. As people grow older, constipation is more common. If you try to fix constipation with medicines that make you have a bowel movement (laxatives), the problem may get worse. Long-term laxative use may cause the muscles of the colon to become weak. A low-fiber diet, not taking in enough fluids, and taking certain medicines may make constipation worse.  CAUSES   Certain medicines, such as antidepressants, pain medicine, iron supplements, antacids, and water pills.   Certain diseases, such as diabetes, irritable bowel syndrome (IBS), thyroid disease, or depression.   Not drinking enough water.   Not eating enough fiber-rich foods.   Stress or travel.   Lack of physical activity or exercise.   Ignoring the urge to have a bowel movement.   Using laxatives too much.  SIGNS AND SYMPTOMS   Having fewer than three bowel movements a week.   Straining to have a bowel movement.   Having stools that are hard, dry, or larger than normal.   Feeling full or bloated.   Pain in the lower abdomen.   Not feeling relief after having a bowel movement.  DIAGNOSIS  Your  health care provider will take a medical history and perform a physical exam. Further testing may be done for severe constipation. Some tests may include:  A barium enema X-ray to examine your rectum, colon, and, sometimes, your small intestine.   A sigmoidoscopy to examine your lower colon.   A colonoscopy to examine your entire colon. TREATMENT  Treatment will depend on the severity of your constipation and what is causing it. Some dietary treatments include drinking more fluids and eating more fiber-rich foods. Lifestyle treatments may include regular exercise. If these diet and lifestyle recommendations do not help, your health care provider may recommend taking over-the-counter laxative medicines to help you have bowel movements. Prescription medicines may be prescribed if over-the-counter medicines do not work.  HOME CARE INSTRUCTIONS   Eat foods that have a lot of fiber, such as fruits, vegetables, whole grains, and beans.  Limit foods high in fat and processed sugars, such as french fries, hamburgers, cookies, candies, and soda.   A fiber supplement may be added to your diet if you cannot get enough fiber from foods.   Drink enough fluids to keep your urine clear or pale yellow.   Exercise regularly or as directed by your health care provider.   Go to the restroom when you have the urge to go. Do not hold it.   Only take over-the-counter or prescription medicines as directed by your health care provider. Do not take other medicines for constipation without talking  to your health care provider first.  [place] IF:   You have bright red blood in your stool.   Your constipation lasts for more than 4 days or gets worse.   You have abdominal or rectal pain.   You have thin, pencil-like stools.   You have unexplained weight loss. MAKE SURE YOU:   Understand these instructions.  Will watch your condition.  Will get help right away if you are  not doing well or get worse. Document Released: 07/11/2004 Document Revised: 10/18/2013 Document Reviewed: 07/25/2013 [facility] Patient Information 2015 [place], [place]. This information is not intended to replace advice given to you by your health care provider. Make sure you discuss any questions you have with your health care provider.

## 2014-07-31 NOTE — Progress Notes (Signed)
Pre visit review using our clinic review tool, if applicable. No additional management support is needed unless otherwise documented below in the visit note. 

## 2014-08-06 NOTE — Assessment & Plan Note (Signed)
Follows with [facility]. Has been moved to annual screening.

## 2014-08-06 NOTE — Assessment & Plan Note (Signed)
Tolerating statin, encouraged heart healthy diet, avoid trans fats, minimize simple carbs and saturated fats. Increase exercise as tolerated 

## 2014-08-06 NOTE — Assessment & Plan Note (Signed)
hgba1c acceptable, minimize simple carbs. Increase exercise as tolerated.  

## 2014-08-06 NOTE — Assessment & Plan Note (Signed)
Patient denies any difficulties at home. No trouble with ADLs, depression or falls. No recent changes to vision or hearing. Is UTD with immunizations. Is UTD with screening. Discussed Advanced Directives, patient agrees to bring [place] copies of documents if can. Encouraged heart healthy diet, exercise as tolerated and adequate sleep. Given flu shot today. Sees Dr Doug Sou office for dermatology Follows with opthamology, Dr Ward Chatters and Dr Cyd Silence with [facility] Colonoscopy in 2010 will not liekly ned another one. Does have some hearing loss in right ear but declines treatement

## 2014-08-11 ENCOUNTER — Other Ambulatory Visit: Payer: Self-pay

## 2014-10-03 ENCOUNTER — Other Ambulatory Visit: Payer: Self-pay

## 2014-10-03 DIAGNOSIS — G4700 Insomnia, unspecified: Secondary | ICD-10-CM

## 2014-10-03 MED ORDER — LORAZEPAM 0.5 MG PO TABS: 0.5000 mg | ORAL_TABLET | Freq: Every evening | ORAL | Status: DC | PRN

## 2014-10-03 NOTE — Progress Notes (Signed)
rx printed for md to sign and fax 

## 2014-10-16 ENCOUNTER — Other Ambulatory Visit: Payer: Self-pay | Admitting: Family Medicine

## 2014-10-16 NOTE — Telephone Encounter (Signed)
Not time for a refill.

## 2014-10-25 ENCOUNTER — Other Ambulatory Visit: Payer: Self-pay | Admitting: *Deleted

## 2014-10-25 MED ORDER — CLOPIDOGREL BISULFATE 75 MG PO TABS: ORAL_TABLET | ORAL | Status: DC

## 2014-10-25 NOTE — Telephone Encounter (Signed)
Clopidogrel 75 mg tab e-scribed to [facility] per protocol. JG//CMA

## 2014-12-01 ENCOUNTER — Ambulatory Visit: Payer: Medicare HMO | Admitting: Family Medicine

## 2014-12-11 ENCOUNTER — Ambulatory Visit: Payer: Medicare HMO | Admitting: Family Medicine

## 2014-12-11 ENCOUNTER — Telehealth: Payer: Self-pay | Admitting: Family Medicine

## 2014-12-11 MED ORDER — MUPIROCIN CALCIUM 2 % NA OINT: 1.0000 "application " | TOPICAL_OINTMENT | Freq: Two times a day (BID) | NASAL | Status: DC

## 2014-12-11 NOTE — Telephone Encounter (Signed)
Caller name: carole Relation to pt: self Call back number: [phone] Pharmacy:  Reason for call:   Patient wife states that patient has been having nose bleeds for the past couple of days and is wanting to know what Dr. Charlett Blake recommends he do for this

## 2014-12-11 NOTE — Telephone Encounter (Signed)
Informed the patients wife of instructions.  Sent in script to [facility].

## 2014-12-11 NOTE — Telephone Encounter (Signed)
He needs to run a humidifier, hydrate well and please send him in the Mupirocin ointment apply to b/l nares via clean qtip bid x 7 days

## 2014-12-29 ENCOUNTER — Other Ambulatory Visit: Payer: Self-pay | Admitting: Family Medicine

## 2014-12-29 DIAGNOSIS — G4700 Insomnia, unspecified: Secondary | ICD-10-CM

## 2014-12-30 MED ORDER — LORAZEPAM 0.5 MG PO TABS: 0.5000 mg | ORAL_TABLET | Freq: Every evening | ORAL | Status: DC | PRN

## 2014-12-30 NOTE — Telephone Encounter (Signed)
OK to refill he has an appointment

## 2015-01-01 NOTE — Telephone Encounter (Signed)
Faxed hardcopy for lorazepam to [facility]

## 2015-01-02 ENCOUNTER — Telehealth: Payer: Self-pay | Admitting: Family Medicine

## 2015-01-02 DIAGNOSIS — G4700 Insomnia, unspecified: Secondary | ICD-10-CM

## 2015-01-02 MED ORDER — SIMVASTATIN 40 MG PO TABS: 40.0000 mg | ORAL_TABLET | Freq: Every day | ORAL | Status: DC

## 2015-01-02 MED ORDER — CLOPIDOGREL BISULFATE 75 MG PO TABS: ORAL_TABLET | ORAL | Status: DC

## 2015-01-02 NOTE — Telephone Encounter (Signed)
Caller name: [place] Relation to pt: self Call back [phone] [facility]  Reason for call:   Requesting refill of clopidegrel, lorazepam and simvastatin. 90 day supply.

## 2015-01-02 NOTE — Telephone Encounter (Signed)
Sent in Simvastatin and Plavix to [facility], informed the patient had filled lorazepam yesterday 01/01/15 already. The patient stated he would go to the pharmacy tomorrow to pickup scripts.

## 2015-01-29 ENCOUNTER — Encounter: Payer: Self-pay | Admitting: Family Medicine

## 2015-01-29 ENCOUNTER — Ambulatory Visit (INDEPENDENT_AMBULATORY_CARE_PROVIDER_SITE_OTHER): Payer: Medicare HMO | Admitting: Family Medicine

## 2015-01-29 VITALS — BP 118/70 | HR 88 | Temp 97.7°F | Resp 18 | Ht 75.0 in | Wt 214.0 lb

## 2015-01-29 DIAGNOSIS — R739 Hyperglycemia, unspecified: Secondary | ICD-10-CM | POA: Diagnosis not present

## 2015-01-29 DIAGNOSIS — I714 Abdominal aortic aneurysm, without rupture, unspecified: Secondary | ICD-10-CM

## 2015-01-29 DIAGNOSIS — R609 Edema, unspecified: Secondary | ICD-10-CM

## 2015-01-29 DIAGNOSIS — R269 Unspecified abnormalities of gait and mobility: Secondary | ICD-10-CM | POA: Diagnosis not present

## 2015-01-29 DIAGNOSIS — E782 Mixed hyperlipidemia: Secondary | ICD-10-CM

## 2015-01-29 DIAGNOSIS — E785 Hyperlipidemia, unspecified: Secondary | ICD-10-CM

## 2015-01-29 DIAGNOSIS — M545 Low back pain: Secondary | ICD-10-CM

## 2015-01-29 DIAGNOSIS — Z23 Encounter for immunization: Secondary | ICD-10-CM

## 2015-01-29 MED ORDER — ZOSTER VACCINE LIVE 19400 UNT/0.65ML ~~LOC~~ SOLR: 0.6500 mL | Freq: Once | SUBCUTANEOUS | Status: DC

## 2015-01-29 NOTE — Patient Instructions (Signed)
Metamucil/psyllium mixed in juice/water once or twice Probiotic daily such as Digestive Advantage  Encouraged increased hydration and fiber in diet. Daily probiotics. If bowels not moving can use MOM 2 tbls po in 4 oz of warm prune juice by mouth every 2-3 days. If no results then repeat in 4 hours with  Dulcolax suppository pr, may repeat again in 4 more hours as needed. Seek care if symptoms worsen. Consider daily Miralax and/or Dulcolax if symptoms persist.   About Constipation  Constipation Overview Constipation is the most common gastrointestinal complaint - about 4 million Americans experience constipation and make 2.5 million physician visits a year to get help for the problem.  Constipation can occur when the colon absorbs too much water, the colon's muscle contraction is slow or sluggish, and/or there is delayed transit time through the colon.  The result is stool that is hard and dry.  Indicators of constipation include straining during bowel movements greater than 25% of the time, having fewer than three bowel movements per week, and/or the feeling of incomplete evacuation.  There are established guidelines ([place] II ) for defining constipation. A person needs to have two or more of the following symptoms for at least 12 weeks (not necessarily consecutive) in the preceding 12 months: . Straining in  greater than 25% of bowel movements . Lumpy or hard stools in greater than 25% of bowel movements . Sensation of incomplete emptying in greater than 25% of bowel movements . Sensation of anorectal obstruction/blockade in greater than 25% of bowel movements . Manual maneuvers to help empty greater than 25% of bowel movements (e.g., digital evacuation, support of the pelvic floor)  . Less than  3 bowel movements/week . Loose stools are not present, and criteria for irritable bowel syndrome are insufficient  Common Causes of Constipation . Lack of fiber in your diet . Lack of physical  activity . Medications, including iron and calcium supplements  . Dairy intake . Dehydration . Abuse of laxatives  Travel  Irritable Bowel Syndrome  Pregnancy  Luteal phase of menstruation (after ovulation and before menses)  Colorectal problems  Intestinal Dysfunction  Treating Constipation  There are several ways of treating constipation, including changes to diet and exercise, use of laxatives, adjustments to the pelvic floor, and scheduled toileting.  These treatments include: . increasing fiber and fluids in the diet  . increasing physical activity . learning muscle coordination   learning proper toileting techniques and toileting modifications   designing and sticking  to a toileting schedule     2007, [employer] Doc.22

## 2015-01-30 LAB — COMPREHENSIVE METABOLIC PANEL
ALK PHOS: 60 U/L (ref 39–117)
ALT: 14 U/L (ref 0–53)
AST: 20 U/L (ref 0–37)
Albumin: 4 g/dL (ref 3.5–5.2)
BUN: 21 mg/dL (ref 6–23)
CO2: 25 mEq/L (ref 19–32)
CREATININE: 1.14 mg/dL (ref 0.40–1.50)
Calcium: 9.1 mg/dL (ref 8.4–10.5)
Chloride: 104 mEq/L (ref 96–112)
GFR: 64.47 mL/min (ref >60.00)
Glucose, Bld: 104 mg/dL — ABNORMAL HIGH (ref 70–99)
POTASSIUM: 4.6 meq/L (ref 3.5–5.1)
Sodium: 137 mEq/L (ref 135–145)
Total Bilirubin: 0.9 mg/dL (ref 0.2–1.2)
Total Protein: 7 g/dL (ref 6.0–8.3)

## 2015-01-30 LAB — LIPID PANEL
CHOLESTEROL: 115 mg/dL (ref 0–200)
HDL: 44.1 mg/dL (ref >39.00)
LDL CALC: 57 mg/dL (ref 0–99)
NonHDL: 70.9
TRIGLYCERIDES: 72 mg/dL (ref 0.0–149.0)
Total CHOL/HDL Ratio: 3
VLDL: 14.4 mg/dL (ref 0.0–40.0)

## 2015-01-30 LAB — CBC
HEMATOCRIT: 39.9 % (ref 39.0–52.0)
Hemoglobin: 13.6 g/dL (ref 13.0–17.0)
MCHC: 34.1 g/dL (ref 30.0–36.0)
MCV: 91.4 fl (ref 78.0–100.0)
Platelets: 188 10*3/uL (ref 150.0–400.0)
RBC: 4.37 Mil/uL (ref 4.22–5.81)
RDW: 14.3 % (ref 11.5–15.5)
WBC: 6.9 10*3/uL (ref 4.0–10.5)

## 2015-01-30 LAB — TSH: TSH: 1.75 u[IU]/mL (ref 0.35–4.50)

## 2015-01-30 LAB — HEMOGLOBIN A1C: HEMOGLOBIN A1C: 6 % (ref 4.6–6.5)

## 2015-02-04 NOTE — Progress Notes (Signed)
Shawn Cortez  [id number] 1925/09/21 02/04/2015      Progress Note-Follow Up  Subjective  Chief Complaint  Chief Complaint  Patient presents with  . Follow-up    4 mo    HPI  Patient is a 81 y.o. male in today for routine medical care. She is in today for follow-up accompanied by his wife. Fairly well. Continues to struggle with disequilibrium and unsteady gait but declines further referral. Has had no recent falls using assistive devices. No recent illness. Denies CP/palp/SOB/HA/congestion/fevers/GI or GU c/o. Taking meds as prescribed  Past Medical History  Diagnosis Date  . Arthritis   . SOB (shortness of breath) on exertion   . Hyperlipidemia   . Stroke     TIA history 2005  . Hyperglycemia 01/04/2012  . Leg pain, right 03/01/2012  . Right leg weakness 03/01/2012  . Skin lesion of right arm 09/14/2012  . Abrasion of hand 09/14/2012  . Cerumen impaction 09/14/2012  . Unsteady gait 12/26/2012  . Insomnia 12/26/2012  . Bilateral groin pain 02/13/2013    L>R  . Peripheral edema 03/29/2013  . Preventative health care 06/26/2013  . AAA (abdominal aortic aneurysm)   . Atrial fibrillation     Past Surgical History  Procedure Laterality Date  . Thumb tendon severed and repaired      right, tendon snapped  . Carpal tunnel release      Right  . Achilles tendon repair      left, repaired w/ wire  . Brain surgery      done by Dr Lizabeth Leyden for tinnitius, unsuccessful, only on right  . Inguinal herniorrhapy  years ago    right and then 2 on left  . Lumbar laminectomy      L3-L5 posterior laminectomy 2006  . Joint replacement      Bilateral total knee replacement  . Spine surgery      Family History  Problem Relation Age of Onset  . Other Mother     CHF  . Ulcers Mother     Venous stasis  . Other Father     CHF  . Cancer Sister     unknown  . Hip fracture Maternal Grandmother   . Pneumonia Maternal Grandmother     History   Social History  . Marital Status:  Married    Spouse Name: N/A  . Number of Children: N/A  . Years of Education: N/A   Occupational History  . Not on file.   Social History Main Topics  . Smoking status: Former Smoker -- 3.00 packs/day    Quit date: 10/16/1960  . Smokeless tobacco: Never Used  . Alcohol Use: 8.4 oz/week    14 Cans of beer per week     Comment: 1-2 beer daily  . Drug Use: No  . Sexual Activity: Not on file   Other Topics Concern  . Not on file   Social History Narrative    Current Outpatient Prescriptions on File Prior to Visit  Medication Sig Dispense Refill  . clopidogrel (PLAVIX) 75 MG tablet TAKE 1 TABLET (75 MG TOTAL) BY MOUTH AT BEDTIME. 90 tablet 1  . fish oil-omega-3 fatty acids 1000 MG capsule Take 500 mg by mouth daily. 2 daily     . LORazepam (ATIVAN) 0.5 MG tablet Take 1 tablet (0.5 mg total) by mouth at bedtime as needed for anxiety. 90 tablet 0  . mupirocin nasal ointment (BACTROBAN) 2 % Place 1 application into the nose 2 (two)  times daily. Use a clean Q tip to apply twice daily for 7 days. 10 g 0  . simvastatin (ZOCOR) 40 MG tablet Take 1 tablet (40 mg total) by mouth daily at 6 PM. 90 tablet 1   No current facility-administered medications on file prior to visit.    Allergies  Allergen Reactions  . Niacin And Related Rash    Facial redness and Rash head to toe  . Omnipaque [Iohexol] Rash    Facial redness and flushing    Review of Systems  Review of Systems  Constitutional: Negative for fever and malaise/fatigue.  HENT: Negative for congestion.   Eyes: Negative for discharge.  Respiratory: Negative for shortness of breath.   Cardiovascular: Negative for chest pain, palpitations and leg swelling.  Gastrointestinal: Negative for nausea, abdominal pain and diarrhea.  Genitourinary: Negative for dysuria.  Musculoskeletal: Negative for falls.  Skin: Negative for rash.  Neurological: Positive for dizziness and weakness. Negative for loss of consciousness and headaches.    Endo/Heme/Allergies: Negative for polydipsia.  Psychiatric/Behavioral: Negative for depression and suicidal ideas. The patient is not nervous/anxious and does not have insomnia.     Objective  BP 118/70 mmHg  Pulse 88  Temp(Src) 97.7 F (36.5 C) (Oral)  Resp 18  Ht 6\' 3"  (1.905 m)  Wt 214 lb (97.07 kg)  BMI 26.75 kg/m2  SpO2 97%  Physical Exam  Physical Exam  Constitutional: He is oriented to person, place, and time and well-developed, well-nourished, and in no distress. No distress.  HENT:  Head: Normocephalic and atraumatic.  Eyes: Conjunctivae are normal.  Neck: Neck supple. No thyromegaly present.  Cardiovascular: Normal rate, regular rhythm and normal heart sounds.   Pulmonary/Chest: Effort normal and breath sounds normal. No respiratory distress.  Abdominal: He exhibits no distension and no mass. There is no tenderness.  Musculoskeletal: He exhibits no edema.  Neurological: He is alert and oriented to person, place, and time.  Skin: Skin is warm.  Psychiatric: Memory, affect and judgment normal.    Lab Results  Component Value Date   TSH 1.75 01/29/2015   Lab Results  Component Value Date   WBC 6.9 01/29/2015   HGB 13.6 01/29/2015   HCT 39.9 01/29/2015   MCV 91.4 01/29/2015   PLT 188.0 01/29/2015   Lab Results  Component Value Date   CREATININE 1.14 01/29/2015   BUN 21 01/29/2015   NA 137 01/29/2015   K 4.6 01/29/2015   CL 104 01/29/2015   CO2 25 01/29/2015   Lab Results  Component Value Date   ALT 14 01/29/2015   AST 20 01/29/2015   ALKPHOS 60 01/29/2015   BILITOT 0.9 01/29/2015   Lab Results  Component Value Date   CHOL 115 01/29/2015   Lab Results  Component Value Date   HDL 44.10 01/29/2015   Lab Results  Component Value Date   LDLCALC 57 01/29/2015   Lab Results  Component Value Date   TRIG 72.0 01/29/2015   Lab Results  Component Value Date   CHOLHDL 3 01/29/2015     Assessment & Plan  Hyperlipidemia Tolerating  statin, encouraged heart healthy diet, avoid trans fats, minimize simple carbs and saturated fats. Increase exercise as tolerated   Hyperglycemia hgba1c acceptable, minimize simple carbs. Increase exercise as tolerated. Continue current meds   UNSTEADY GAIT Continues to struggle but declines referral manages with assistive devices   Abdominal aortic aneurysm Stable, asymptomatic, following with vascular surgeon

## 2015-02-04 NOTE — Assessment & Plan Note (Signed)
hgba1c acceptable, minimize simple carbs. Increase exercise as tolerated. Continue current meds 

## 2015-02-04 NOTE — Assessment & Plan Note (Signed)
Continues to struggle but declines referral manages with assistive devices

## 2015-02-04 NOTE — Assessment & Plan Note (Signed)
Tolerating statin, encouraged heart healthy diet, avoid trans fats, minimize simple carbs and saturated fats. Increase exercise as tolerated 

## 2015-02-04 NOTE — Assessment & Plan Note (Signed)
Stable, asymptomatic, following with vascular surgeon

## 2015-02-08 ENCOUNTER — Telehealth: Payer: Self-pay | Admitting: *Deleted

## 2015-02-08 NOTE — Telephone Encounter (Signed)
While on the phone with the pt's wife she asked about the pt's last lab results.   Informed her of the pt's recent lab results and note.  Wife verbalized understanding.  Informed the wife that the results were sent to the pt through [facility].  She stated that the pt did not use the MyChart.  Asked her if the pt would like for me to deactivate the MyChart, and she stated that yes he would like for me to deactivate it.   MyChart was deactivated.//AB/CMA

## 2015-04-02 ENCOUNTER — Other Ambulatory Visit: Payer: Self-pay | Admitting: Family Medicine

## 2015-04-02 DIAGNOSIS — G4700 Insomnia, unspecified: Secondary | ICD-10-CM

## 2015-04-02 MED ORDER — LORAZEPAM 0.5 MG PO TABS: 0.5000 mg | ORAL_TABLET | Freq: Every evening | ORAL | Status: DC | PRN

## 2015-04-02 NOTE — Telephone Encounter (Signed)
Faxed hardcopy for Lorazepam to [facility] [place] 

## 2015-04-02 NOTE — Telephone Encounter (Signed)
Requesting:--  LORAZEPAM 0.5 TABLET Contract -- SIGNED ON 12/08/14 UDS--  NO UDS GIVEN Last OV --  01/29/15 Last Refill --  12/30/14  #90 WITH 0 REFILLS.  Please Advise

## 2015-04-06 NOTE — Telephone Encounter (Signed)
Had to call in prescription for Lorazepam to [facility] on 04/06/15 as pharmacy never received refill that was faxed.

## 2015-06-04 ENCOUNTER — Ambulatory Visit (INDEPENDENT_AMBULATORY_CARE_PROVIDER_SITE_OTHER): Payer: Medicare HMO | Admitting: Family Medicine

## 2015-06-04 VITALS — BP 103/66 | HR 85 | Temp 98.1°F | Ht 75.0 in | Wt 202.0 lb

## 2015-06-04 DIAGNOSIS — F329 Major depressive disorder, single episode, unspecified: Secondary | ICD-10-CM

## 2015-06-04 DIAGNOSIS — I499 Cardiac arrhythmia, unspecified: Secondary | ICD-10-CM

## 2015-06-04 DIAGNOSIS — K625 Hemorrhage of anus and rectum: Secondary | ICD-10-CM

## 2015-06-04 DIAGNOSIS — F32A Depression, unspecified: Secondary | ICD-10-CM

## 2015-06-04 DIAGNOSIS — R739 Hyperglycemia, unspecified: Secondary | ICD-10-CM

## 2015-06-04 DIAGNOSIS — G4700 Insomnia, unspecified: Secondary | ICD-10-CM

## 2015-06-04 DIAGNOSIS — R0602 Shortness of breath: Secondary | ICD-10-CM

## 2015-06-04 DIAGNOSIS — I359 Nonrheumatic aortic valve disorder, unspecified: Secondary | ICD-10-CM

## 2015-06-04 DIAGNOSIS — R5383 Other fatigue: Secondary | ICD-10-CM | POA: Diagnosis not present

## 2015-06-04 DIAGNOSIS — E785 Hyperlipidemia, unspecified: Secondary | ICD-10-CM | POA: Diagnosis not present

## 2015-06-04 DIAGNOSIS — I4891 Unspecified atrial fibrillation: Secondary | ICD-10-CM

## 2015-06-04 DIAGNOSIS — I2510 Atherosclerotic heart disease of native coronary artery without angina pectoris: Secondary | ICD-10-CM

## 2015-06-04 DIAGNOSIS — G609 Hereditary and idiopathic neuropathy, unspecified: Secondary | ICD-10-CM

## 2015-06-04 LAB — IRON AND TIBC
%SAT: 20 % (ref 20–55)
IRON: 53 ug/dL (ref 42–165)
TIBC: 264 ug/dL (ref 215–435)
UIBC: 211 ug/dL (ref 125–400)

## 2015-06-04 LAB — CBC
HCT: 41.3 % (ref 39.0–52.0)
Hemoglobin: 13.5 g/dL (ref 13.0–17.0)
MCHC: 32.8 g/dL (ref 30.0–36.0)
MCV: 92 fl (ref 78.0–100.0)
Platelets: 185 10*3/uL (ref 150.0–400.0)
RBC: 4.49 Mil/uL (ref 4.22–5.81)
RDW: 14.7 % (ref 11.5–15.5)
WBC: 7.6 10*3/uL (ref 4.0–10.5)

## 2015-06-04 LAB — COMPREHENSIVE METABOLIC PANEL
ALT: 18 U/L (ref 0–53)
AST: 22 U/L (ref 0–37)
Albumin: 4 g/dL (ref 3.5–5.2)
Alkaline Phosphatase: 67 U/L (ref 39–117)
BUN: 20 mg/dL (ref 6–23)
CO2: 21 meq/L (ref 19–32)
CREATININE: 1.05 mg/dL (ref 0.40–1.50)
Calcium: 9.2 mg/dL (ref 8.4–10.5)
Chloride: 102 mEq/L (ref 96–112)
GFR: 70.83 mL/min (ref >60.00)
GLUCOSE: 101 mg/dL — AB (ref 70–99)
Potassium: 4.2 mEq/L (ref 3.5–5.1)
SODIUM: 135 meq/L (ref 135–145)
TOTAL PROTEIN: 7.2 g/dL (ref 6.0–8.3)
Total Bilirubin: 1.1 mg/dL (ref 0.2–1.2)

## 2015-06-04 LAB — VITAMIN B12: VITAMIN B 12: 407 pg/mL (ref 211–911)

## 2015-06-04 LAB — TSH: TSH: 1.9 u[IU]/mL (ref 0.35–4.50)

## 2015-06-04 LAB — HEMOGLOBIN A1C: HEMOGLOBIN A1C: 6.1 % (ref 4.6–6.5)

## 2015-06-04 MED ORDER — CITALOPRAM HYDROBROMIDE 10 MG PO TABS: 10.0000 mg | ORAL_TABLET | Freq: Every day | ORAL | Status: DC

## 2015-06-04 NOTE — Progress Notes (Signed)
Pre visit review using our clinic review tool, if applicable. No additional management support is needed unless otherwise documented below in the visit note. 

## 2015-06-04 NOTE — Patient Instructions (Addendum)
Encouraged increased hydration and fiber in diet. Daily probiotics. If bowels not moving can use MOM 2 tbls po in 4 oz of warm prune juice by mouth every 2-3 days. If no results then repeat in 4 hours with  Dulcolax suppository pr, may repeat again in 4 more hours as needed. Seek care if symptoms worsen. Consider daily Miralax and/or Dulcolax if symptoms persist.    Atrial Fibrillation Atrial fibrillation is a type of irregular heart rhythm (arrhythmia). During atrial fibrillation, the upper chambers of the heart (atria) quiver continuously in a chaotic pattern. This causes an irregular and often rapid heart rate.  Atrial fibrillation is the result of the heart becoming overloaded with disorganized signals that tell it to beat. These signals are normally released one at a time by a part of the right atrium called the sinoatrial node. They then travel from the atria to the lower chambers of the heart (ventricles), causing the atria and ventricles to contract and pump blood as they pass. In atrial fibrillation, parts of the atria outside of the sinoatrial node also release these signals. This results in two problems. First, the atria receive so many signals that they do not have time to fully contract. Second, the ventricles, which can only receive one signal at a time, beat irregularly and out of rhythm with the atria.  There are three types of atrial fibrillation:   Paroxysmal. Paroxysmal atrial fibrillation starts suddenly and stops on its own within a week.  Persistent. Persistent atrial fibrillation lasts for more than a week. It may stop on its own or with treatment.  Permanent. Permanent atrial fibrillation does not go away. Episodes of atrial fibrillation may lead to permanent atrial fibrillation. Atrial fibrillation can prevent your heart from pumping blood normally. It increases your risk of stroke and can lead to heart failure.  CAUSES   Heart conditions, including a heart attack, heart  failure, coronary artery disease, and heart valve conditions.   Inflammation of the sac that surrounds the heart (pericarditis).  Blockage of an artery in the lungs (pulmonary embolism).  Pneumonia or other infections.  Chronic lung disease.  Thyroid problems, especially if the thyroid is overactive (hyperthyroidism).  Caffeine, excessive alcohol use, and use of some illegal drugs.   Use of some medicines, including certain decongestants and diet pills.  Heart surgery.   Birth defects.  Sometimes, no cause can be found. When this happens, the atrial fibrillation is called lone atrial fibrillation. The risk of complications from atrial fibrillation increases if you have lone atrial fibrillation and you are age 10 years or older. RISK FACTORS  Heart failure.  Coronary artery disease.  Diabetes mellitus.   High blood pressure (hypertension).   Obesity.   Other arrhythmias.   Increased age. SIGNS AND SYMPTOMS   A feeling that your heart is beating rapidly or irregularly.   A feeling of discomfort or pain in your chest.   Shortness of breath.   Sudden light-headedness or weakness.   Getting tired easily when exercising.   Urinating more often than normal (mainly when atrial fibrillation first begins).  In paroxysmal atrial fibrillation, symptoms may start and suddenly stop. DIAGNOSIS  Your health care provider may be able to detect atrial fibrillation when taking your pulse. Your health care provider may have you take a test called an ambulatory electrocardiogram (ECG). An ECG records your heartbeat patterns over a 24-hour period. You may also have other tests, such as:  Transthoracic echocardiogram (TTE). During echocardiography, sound waves  are used to evaluate how blood flows through your heart.  Transesophageal echocardiogram (TEE).  Stress test. There is more than one type of stress test. If a stress test is needed, ask your health care provider  about which type is best for you.  Chest X-ray exam.  Blood tests.  Computed tomography (CT). TREATMENT  Treatment may include:  Treating any underlying conditions. For example, if you have an overactive thyroid, treating the condition may correct atrial fibrillation.  Taking medicine. Medicines may be given to control a rapid heart rate or to prevent blood clots, heart failure, or a stroke.  Having a procedure to correct the rhythm of the heart:  Electrical cardioversion. During electrical cardioversion, a controlled, low-energy shock is delivered to the heart through your skin. If you have chest pain, very low blood pressure, or sudden heart failure, this procedure may need to be done as an emergency.  Catheter ablation. During this procedure, heart tissues that send the signals that cause atrial fibrillation are destroyed.  Surgical ablation. During this surgery, thin lines of heart tissue that carry the abnormal signals are destroyed. This procedure can either be an open-heart surgery or a minimally invasive surgery. With the minimally invasive surgery, small cuts are made to access the heart instead of a large opening.  Pulmonary venous isolation. During this surgery, tissue around the veins that carry blood from the lungs (pulmonary veins) is destroyed. This tissue is thought to carry the abnormal signals. HOME CARE INSTRUCTIONS   Take medicines only as directed by your health care provider. Some medicines can make atrial fibrillation worse or recur.  If blood thinners were prescribed by your health care provider, take them exactly as directed. Too much blood-thinning medicine can cause bleeding. If you take too little, you will not have the needed protection against stroke and other problems.  Perform blood tests at home if directed by your health care provider. Perform blood tests exactly as directed.  Quit smoking if you smoke.  Do not drink alcohol.  Do not drink  caffeinated beverages such as coffee, soda, and some teas. You may drink decaffeinated coffee, soda, or tea.   Maintain a healthy weight.Do not use diet pills unless your health care provider approves. They may make heart problems worse.   Follow diet instructions as directed by your health care provider.  Exercise regularly as directed by your health care provider.  Keep all follow-up visits as directed by your health care provider. This is important. PREVENTION  The following substances can cause atrial fibrillation to recur:   Caffeinated beverages.  Alcohol.  Certain medicines, especially those used for breathing problems.  Certain herbs and herbal medicines, such as those containing ephedra or ginseng.  Illegal drugs, such as cocaine and amphetamines. Sometimes medicines are given to prevent atrial fibrillation from recurring. Proper treatment of any underlying condition is also important in helping prevent recurrence.  SEEK MEDICAL CARE IF:  You notice a change in the rate, rhythm, or strength of your heartbeat.  You suddenly begin urinating more frequently.  You tire more easily when exerting yourself or exercising. SEEK IMMEDIATE MEDICAL CARE IF:   You have chest pain, abdominal pain, sweating, or weakness.  You feel nauseous.  You have shortness of breath.  You suddenly have swollen feet and ankles.  You feel dizzy.  Your face or limbs feel numb or weak.  You have a change in your vision or speech. MAKE SURE YOU:   Understand these instructions.  Will watch your condition.  Will get help right away if you are not doing well or get worse. Document Released: 10/13/2005 Document Revised: 02/27/2014 Document Reviewed: 11/23/2012 [facility] Patient Information 2015 [place], [place]. This information is not intended to replace advice given to you by your health care provider. Make sure you discuss any questions you have with your health care provider.

## 2015-06-05 ENCOUNTER — Telehealth: Payer: Self-pay | Admitting: Family Medicine

## 2015-06-05 NOTE — Telephone Encounter (Signed)
Do not think we should increase the Lorazepam, at his age the risk of confusion, falls, fractures worsens steadily as we increase the dosing. We could switch to Belsomra for sleep which has less side effects if he wants

## 2015-06-05 NOTE — Telephone Encounter (Signed)
Caller name: Krist Relationship to patient: self Can be reached: [phone] Pharmacy: [facility]  Reason for call: Pt called stating he would be better off if he could take 2 tablets of LORazepam (ATIVAN) 0.5 MG tablet each night  Or increase the dose of meds. Please call pt to advise. He would still need 90 day supply of meds.

## 2015-06-06 ENCOUNTER — Ambulatory Visit (HOSPITAL_BASED_OUTPATIENT_CLINIC_OR_DEPARTMENT_OTHER)
Admission: RE | Admit: 2015-06-06 | Discharge: 2015-06-06 | Disposition: A | Discharge status: Home / Self Care | Payer: Medicare HMO | Source: Ambulatory Visit | Attending: Family Medicine | Admitting: Family Medicine

## 2015-06-06 DIAGNOSIS — I059 Rheumatic mitral valve disease, unspecified: Secondary | ICD-10-CM | POA: Diagnosis not present

## 2015-06-06 DIAGNOSIS — I517 Cardiomegaly: Secondary | ICD-10-CM | POA: Insufficient documentation

## 2015-06-06 DIAGNOSIS — I350 Nonrheumatic aortic (valve) stenosis: Secondary | ICD-10-CM | POA: Diagnosis present

## 2015-06-06 DIAGNOSIS — I4891 Unspecified atrial fibrillation: Secondary | ICD-10-CM | POA: Diagnosis not present

## 2015-06-06 DIAGNOSIS — E785 Hyperlipidemia, unspecified: Secondary | ICD-10-CM | POA: Diagnosis not present

## 2015-06-06 DIAGNOSIS — I313 Pericardial effusion (noninflammatory): Secondary | ICD-10-CM | POA: Insufficient documentation

## 2015-06-06 DIAGNOSIS — R0602 Shortness of breath: Secondary | ICD-10-CM | POA: Diagnosis not present

## 2015-06-06 DIAGNOSIS — I359 Nonrheumatic aortic valve disorder, unspecified: Secondary | ICD-10-CM

## 2015-06-06 NOTE — Telephone Encounter (Signed)
The same was shared with patient and his wife.  Patient does not want to switch at this time.  Lab results were also discussed with patient/wife.  They stated understanding.  No further needs voiced at this time.

## 2015-06-06 NOTE — Progress Notes (Signed)
*  PRELIMINARY RESULTS* Echocardiogram 2D Echocardiogram has been performed.  Shawn Cortez 06/06/2015, 11:02 AM

## 2015-06-13 ENCOUNTER — Other Ambulatory Visit (HOSPITAL_BASED_OUTPATIENT_CLINIC_OR_DEPARTMENT_OTHER): Payer: Medicare HMO

## 2015-06-15 ENCOUNTER — Telehealth: Payer: Self-pay | Admitting: Family Medicine

## 2015-06-15 NOTE — Telephone Encounter (Signed)
Caller name:Carol Relation to CV:KFMMCR Call back [place]:  Reason for call: pt's wife would like for you to call her states she has a question regarding a medication that dr. Charlett Blake put him o

## 2015-06-15 NOTE — Telephone Encounter (Signed)
Patient wanted confirmation if ok to take his citalopram in the mornings and lorazepam at night.

## 2015-06-15 NOTE — Telephone Encounter (Signed)
That should be just fine.

## 2015-06-15 NOTE — Telephone Encounter (Signed)
Patient informed of PCP instructions. 

## 2015-06-17 ENCOUNTER — Encounter: Payer: Self-pay | Admitting: Family Medicine

## 2015-06-17 DIAGNOSIS — I4891 Unspecified atrial fibrillation: Secondary | ICD-10-CM

## 2015-06-17 DIAGNOSIS — R0602 Shortness of breath: Secondary | ICD-10-CM | POA: Insufficient documentation

## 2015-06-17 DIAGNOSIS — F32A Depression, unspecified: Secondary | ICD-10-CM | POA: Insufficient documentation

## 2015-06-17 DIAGNOSIS — F329 Major depressive disorder, single episode, unspecified: Secondary | ICD-10-CM | POA: Insufficient documentation

## 2015-06-17 DIAGNOSIS — K625 Hemorrhage of anus and rectum: Secondary | ICD-10-CM | POA: Insufficient documentation

## 2015-06-17 HISTORY — DX: Depression, unspecified: F32.A

## 2015-06-17 HISTORY — DX: Unspecified atrial fibrillation: I48.91

## 2015-06-17 NOTE — Assessment & Plan Note (Signed)
Tolerating statin, encouraged heart healthy diet, avoid trans fats, minimize simple carbs and saturated fats. Increase exercise as tolerated 

## 2015-06-17 NOTE — Assessment & Plan Note (Signed)
Has been referred to cardiology to hear his optins will seek care if symptoms worsen. No strenuous exercise til seen

## 2015-06-17 NOTE — Assessment & Plan Note (Signed)
Numerous symptoms have worsened since some recent dental work. 2D echo ordered. Given rx for transport chair til work up complete

## 2015-06-17 NOTE — Progress Notes (Signed)
Subjective:    Patient ID: Shawn Cortez, male    DOB: 1925/05/22, 81 y.o.   MRN: [id number]  Chief Complaint  Patient presents with  . Fatigue    HPI Patient is in today for evaluation of numerous complaints. He is accompanied by his wife and daughter. They report he recently had 9 teeth pulled and has not been the same since. He has been more forgetful and easily confused. He has been lethargic, easily fatigued and SOB. They deny chest pain or palpitations. Has also had some constipation but no bloody or tarry stool. Denies CP/palp/HA/congestion/fevers or GU c/o. Taking meds as prescribed  Past Medical History  Diagnosis Date  . Arthritis   . SOB (shortness of breath) on exertion   . Hyperlipidemia   . Stroke     TIA history 2005  . Hyperglycemia 01/04/2012  . Leg pain, right 03/01/2012  . Right leg weakness 03/01/2012  . Skin lesion of right arm 09/14/2012  . Abrasion of hand 09/14/2012  . Cerumen impaction 09/14/2012  . Unsteady gait 12/26/2012  . Insomnia 12/26/2012  . Bilateral groin pain 02/13/2013    L>R  . Peripheral edema 03/29/2013  . Preventative health care 06/26/2013  . AAA (abdominal aortic aneurysm)   . Atrial fibrillation     Past Surgical History  Procedure Laterality Date  . Thumb tendon severed and repaired      right, tendon snapped  . Carpal tunnel release      Right  . Achilles tendon repair      left, repaired w/ wire  . Brain surgery      done by Dr Lizabeth Leyden for tinnitius, unsuccessful, only on right  . Inguinal herniorrhapy  years ago    right and then 2 on left  . Lumbar laminectomy      L3-L5 posterior laminectomy 2006  . Joint replacement      Bilateral total knee replacement  . Spine surgery      Family History  Problem Relation Age of Onset  . Other Mother     CHF  . Ulcers Mother     Venous stasis  . Other Father     CHF  . Cancer Sister     unknown  . Hip fracture Maternal Grandmother   . Pneumonia Maternal Grandmother      Social History   Social History  . Marital Status: Married    Spouse Name: N/A  . Number of Children: N/A  . Years of Education: N/A   Occupational History  . Not on file.   Social History Main Topics  . Smoking status: Former Smoker -- 3.00 packs/day    Quit date: 10/16/1960  . Smokeless tobacco: Never Used  . Alcohol Use: 8.4 oz/week    14 Cans of beer per week     Comment: 1-2 beer daily  . Drug Use: No  . Sexual Activity: Not on file   Other Topics Concern  . Not on file   Social History Narrative    Outpatient Prescriptions Prior to Visit  Medication Sig Dispense Refill  . clopidogrel (PLAVIX) 75 MG tablet TAKE 1 TABLET (75 MG TOTAL) BY MOUTH AT BEDTIME. 90 tablet 1  . fish oil-omega-3 fatty acids 1000 MG capsule Take 500 mg by mouth daily. 2 daily     . LORazepam (ATIVAN) 0.5 MG tablet Take 1 tablet (0.5 mg total) by mouth at bedtime as needed for anxiety. 90 tablet 0  . simvastatin (ZOCOR) 40  MG tablet Take 1 tablet (40 mg total) by mouth daily at 6 PM. 90 tablet 1  . zoster vaccine live, PF, (ZOSTAVAX) 78469 UNT/0.65ML injection Inject 19,400 Units into the skin once. 1 each 0  . mupirocin nasal ointment (BACTROBAN) 2 % Place 1 application into the nose 2 (two) times daily. Use a clean Q tip to apply twice daily for 7 days. 10 g 0   No facility-administered medications prior to visit.    Allergies  Allergen Reactions  . Niacin And Related Rash    Facial redness and Rash head to toe  . Omnipaque [Iohexol] Rash    Facial redness and flushing    Review of Systems  Constitutional: Positive for malaise/fatigue and diaphoresis. Negative for fever.  HENT: Negative for congestion.   Eyes: Negative for discharge.  Respiratory: Positive for shortness of breath.   Cardiovascular: Negative for chest pain, palpitations and leg swelling.  Gastrointestinal: Negative for nausea and abdominal pain.  Genitourinary: Negative for dysuria.  Musculoskeletal: Negative  for falls.  Skin: Negative for rash.  Neurological: Positive for dizziness. Negative for loss of consciousness and headaches.  Endo/Heme/Allergies: Negative for environmental allergies.  Psychiatric/Behavioral: Positive for depression and memory loss. The patient is not nervous/anxious.        Objective:    Physical Exam  Constitutional: He is oriented to person, place, and time. He appears well-developed and well-nourished. No distress.  HENT:  Head: Normocephalic and atraumatic.  Eyes: Conjunctivae are normal.  Neck: Neck supple. No thyromegaly present.  Cardiovascular: Normal rate.   Murmur heard. Pulmonary/Chest: Effort normal and breath sounds normal. No respiratory distress. He has no wheezes.  Abdominal: Soft. Bowel sounds are normal. He exhibits no mass. There is no tenderness.  Musculoskeletal: He exhibits no edema.  Lymphadenopathy:    He has no cervical adenopathy.  Neurological: He is alert and oriented to person, place, and time.  Skin: Skin is warm and dry.  Psychiatric: He has a normal mood and affect. His behavior is normal.    BP 103/66 mmHg  Pulse 85  Temp(Src) 98.1 F (36.7 C)  Ht 6\' 3"  (1.905 m)  Wt 202 lb (91.627 kg)  BMI 25.25 kg/m2  SpO2 100% Wt Readings from Last 3 Encounters:  06/04/15 202 lb (91.627 kg)  01/29/15 214 lb (97.07 kg)  07/31/14 216 lb (97.977 kg)     Lab Results  Component Value Date   WBC 7.6 06/04/2015   HGB 13.5 06/04/2015   HCT 41.3 06/04/2015   PLT 185.0 06/04/2015   GLUCOSE 101* 06/04/2015   CHOL 115 01/29/2015   TRIG 72.0 01/29/2015   HDL 44.10 01/29/2015   LDLCALC 57 01/29/2015   ALT 18 06/04/2015   AST 22 06/04/2015   NA 135 06/04/2015   K 4.2 06/04/2015   CL 102 06/04/2015   CREATININE 1.05 06/04/2015   BUN 20 06/04/2015   CO2 21 06/04/2015   TSH 1.90 06/04/2015   INR 1.0 11/06/2008   HGBA1C 6.1 06/04/2015    Lab Results  Component Value Date   TSH 1.90 06/04/2015   Lab Results  Component Value  Date   WBC 7.6 06/04/2015   HGB 13.5 06/04/2015   HCT 41.3 06/04/2015   MCV 92.0 06/04/2015   PLT 185.0 06/04/2015   Lab Results  Component Value Date   NA 135 06/04/2015   K 4.2 06/04/2015   CO2 21 06/04/2015   GLUCOSE 101* 06/04/2015   BUN 20 06/04/2015   CREATININE 1.05  06/04/2015   BILITOT 1.1 06/04/2015   ALKPHOS 67 06/04/2015   AST 22 06/04/2015   ALT 18 06/04/2015   PROT 7.2 06/04/2015   ALBUMIN 4.0 06/04/2015   CALCIUM 9.2 06/04/2015   GFR 70.83 06/04/2015   Lab Results  Component Value Date   CHOL 115 01/29/2015   Lab Results  Component Value Date   HDL 44.10 01/29/2015   Lab Results  Component Value Date   LDLCALC 57 01/29/2015   Lab Results  Component Value Date   TRIG 72.0 01/29/2015   Lab Results  Component Value Date   CHOLHDL 3 01/29/2015   Lab Results  Component Value Date   HGBA1C 6.1 06/04/2015       Assessment & Plan:   Problem List Items Addressed This Visit    SOB (shortness of breath)    Numerous symptoms have worsened since some recent dental work. 2D echo ordered. Given rx for transport chair til work up complete      Relevant Medications   citalopram (CELEXA) 10 MG tablet   Other Relevant Orders   TSH (Completed)   CBC (Completed)   Comprehensive metabolic panel (Completed)   Iron and TIBC (Completed)   Hemoglobin A1c (Completed)   Vitamin B12 (Completed)   EKG 12-Lead (Completed)   Echocardiogram (Completed)   Rectal bleeding   Relevant Orders   TSH (Completed)   CBC (Completed)   Comprehensive metabolic panel (Completed)   Iron and TIBC (Completed)   Hemoglobin A1c (Completed)   Vitamin B12 (Completed)   EKG 12-Lead (Completed)   New onset a-fib    Referred to cardiology for further consideration. He is at high risk for falls will not change anticoagulant regimen      Relevant Orders   Ambulatory referral to Cardiology   Echocardiogram (Completed)   Insomnia   Relevant Orders   TSH (Completed)   CBC  (Completed)   Comprehensive metabolic panel (Completed)   Iron and TIBC (Completed)   Hemoglobin A1c (Completed)   Vitamin B12 (Completed)   EKG 12-Lead (Completed)   Hyperlipidemia    Tolerating statin, encouraged heart healthy diet, avoid trans fats, minimize simple carbs and saturated fats. Increase exercise as tolerated      Relevant Orders   TSH (Completed)   CBC (Completed)   Comprehensive metabolic panel (Completed)   Iron and TIBC (Completed)   Hemoglobin A1c (Completed)   Vitamin B12 (Completed)   EKG 12-Lead (Completed)   Hyperglycemia     minimize simple carbs. Increase exercise as tolerated.       Relevant Orders   TSH (Completed)   CBC (Completed)   Comprehensive metabolic panel (Completed)   Iron and TIBC (Completed)   Hemoglobin A1c (Completed)   Vitamin B12 (Completed)   EKG 12-Lead (Completed)   Hereditary and idiopathic peripheral neuropathy   Relevant Medications   citalopram (CELEXA) 10 MG tablet   Other Relevant Orders   TSH (Completed)   CBC (Completed)   Comprehensive metabolic panel (Completed)   Iron and TIBC (Completed)   Hemoglobin A1c (Completed)   Vitamin B12 (Completed)   EKG 12-Lead (Completed)   Depression   Relevant Medications   citalopram (CELEXA) 10 MG tablet   Aortic valve disorder    Has been referred to cardiology to hear his optins will seek care if symptoms worsen. No strenuous exercise til seen      Relevant Orders   Echocardiogram (Completed)    Other Visit Diagnoses    Other fatigue    -  Primary    Relevant Orders    TSH (Completed)    CBC (Completed)    Comprehensive metabolic panel (Completed)    Iron and TIBC (Completed)    Hemoglobin A1c (Completed)    Vitamin B12 (Completed)    EKG 12-Lead (Completed)    Irregular heart rhythm        Relevant Orders    TSH (Completed)    CBC (Completed)    Comprehensive metabolic panel (Completed)    Iron and TIBC (Completed)    Hemoglobin A1c (Completed)    Vitamin  B12 (Completed)    EKG 12-Lead (Completed)    Coronary artery disease involving native coronary artery of native heart without angina pectoris        Relevant Orders    Ambulatory referral to Cardiology       I have discontinued Mr. Poplaski's mupirocin nasal ointment. I am also having him start on citalopram. Additionally, I am having him maintain his fish oil-omega-3 fatty acids, simvastatin, clopidogrel, zoster vaccine live (PF), and LORazepam.  Meds ordered this encounter  Medications  . citalopram (CELEXA) 10 MG tablet    Sig: Take 1 tablet (10 mg total) by mouth daily.    Dispense:  30 tablet    Refill:  2     Penni Homans, MD

## 2015-06-17 NOTE — Assessment & Plan Note (Signed)
Referred to cardiology for further consideration. He is at high risk for falls will not change anticoagulant regimen

## 2015-06-17 NOTE — Assessment & Plan Note (Signed)
minimize simple carbs. Increase exercise as tolerated.  

## 2015-06-18 NOTE — Telephone Encounter (Signed)
Informed the patients wife of PCP instructions on medication.

## 2015-06-18 NOTE — Telephone Encounter (Signed)
Yes it is OK to stop he is on a very low dose. This is an unusual reaction so not sure it is the medication but it is OK to just stop

## 2015-06-18 NOTE — Telephone Encounter (Signed)
Caller name:Carol  Relation to SR:PRXYVO  Call back number:[phone]  Further questions about Citalopram.

## 2015-06-18 NOTE — Telephone Encounter (Signed)
Spoke to [place] the patients wife.  She stated he has taken citalopram for 8 days. Yesterday morning (sunday) he woke and was unable to stand due to weakness and aches.  She did not give him a citalopram on Sunday and today.  The last day taking one was on Saturday.  She said today he is feeling much better/stronger and as the day progressed yesterday his strength returned. Is it ok to just stop, he took for a total of 8 days.  Also would it be helpful if he took a daily vitamin.???

## 2015-07-02 DIAGNOSIS — I350 Nonrheumatic aortic (valve) stenosis: Secondary | ICD-10-CM | POA: Insufficient documentation

## 2015-07-02 HISTORY — DX: Nonrheumatic aortic (valve) stenosis: I35.0

## 2015-07-02 NOTE — Progress Notes (Signed)
Cardiology Office Note   Date:  07/02/2015   ID:  Shawn Cortez, DOB 02-Feb-1925, MRN [id number]  PCP:  Penni Homans, MD  Cardiologist:   Sharol Harness, MD   No chief complaint on file.     History of Present Illness: Shawn Cortez is a 81 y.o. male with coronary artery disease, hyperlipidemia, AAA, shortness of breath who presents for an evaluation of new onset atrial fibrillation and newly diagnosed severe aortic stenosis.  Shawn Cortez was evaluated by his PCP, Dr. Penni Homans on 06/17/15.  At that appointment he was noted to be in atrial fibrillation.  Given that he already takes plavix for prior stroke and was deemed a falls risk, anticoagulation was not initiated.  He was also noted to have a murmur, so he was referred to cardiology for further assessment.  Echo showed LVEF 50-55%.  There was severe aortic stenosis with a peak velocity of 4 m/s, mean gradient 38 mmHg and valve area of 0.66 cm^2.    Shawn Cortez reports dyspnea on exertion that has been worsening for the last few months. He is now unable to walk across the room without feeling short of breath. He also reports severe gait instability. He only walks using a assistive device. He endorses chronic lower extremity edema that has been unchanged from prior. Shawn Cortez denies chest pain, orthopnea, PND or palpitations. He also denies lightheadedness or dizziness.  3 months ago Shawn Cortez had 9 teeth removed by his dentist. Since then his wife and daughter have noticed significant memory deficits.  Past Medical History  Diagnosis Date  . Arthritis   . SOB (shortness of breath) on exertion   . Hyperlipidemia   . Stroke     TIA history 2005  . Hyperglycemia 01/04/2012  . Leg pain, right 03/01/2012  . Right leg weakness 03/01/2012  . Skin lesion of right arm 09/14/2012  . Abrasion of hand 09/14/2012  . Cerumen impaction 09/14/2012  . Unsteady gait 12/26/2012  . Insomnia 12/26/2012  . Bilateral groin pain  02/13/2013    L>R  . Peripheral edema 03/29/2013  . Preventative health care 06/26/2013  . AAA (abdominal aortic aneurysm)   . Atrial fibrillation     Past Surgical History  Procedure Laterality Date  . Thumb tendon severed and repaired      right, tendon snapped  . Carpal tunnel release      Right  . Achilles tendon repair      left, repaired w/ wire  . Brain surgery      done by Dr Lizabeth Leyden for tinnitius, unsuccessful, only on right  . Inguinal herniorrhapy  years ago    right and then 2 on left  . Lumbar laminectomy      L3-L5 posterior laminectomy 2006  . Joint replacement      Bilateral total knee replacement  . Spine surgery       Current Outpatient Prescriptions  Medication Sig Dispense Refill  . citalopram (CELEXA) 10 MG tablet Take 1 tablet (10 mg total) by mouth daily. 30 tablet 2  . clopidogrel (PLAVIX) 75 MG tablet TAKE 1 TABLET (75 MG TOTAL) BY MOUTH AT BEDTIME. 90 tablet 1  . fish oil-omega-3 fatty acids 1000 MG capsule Take 500 mg by mouth daily. 2 daily     . LORazepam (ATIVAN) 0.5 MG tablet Take 1 tablet (0.5 mg total) by mouth at bedtime as needed for anxiety. 90 tablet 0  . simvastatin (ZOCOR) 40 MG  tablet Take 1 tablet (40 mg total) by mouth daily at 6 PM. 90 tablet 1  . zoster vaccine live, PF, (ZOSTAVAX) 85027 UNT/0.65ML injection Inject 19,400 Units into the skin once. 1 each 0   No current facility-administered medications for this visit.    Allergies:   Niacin and related and Omnipaque    Social History:  The patient  reports that he quit smoking about 54 years ago. He has never used smokeless tobacco. He reports that he drinks about 8.4 oz of alcohol per week. He reports that he does not use illicit drugs.   Family History:  The patient's family history includes Cancer in his sister; Hip fracture in his maternal grandmother; Other in his father and mother; Pneumonia in his maternal grandmother; Ulcers in his mother.    ROS:  Please see the history  of present illness.   Otherwise, review of systems are positive for none.   All other systems are reviewed and negative.    PHYSICAL EXAM: VS:  There were no vitals taken for this visit. , BMI There is no weight on file to calculate BMI. GENERAL:  Well appearing HEENT:  Pupils equal round and reactive, fundi not visualized, oral mucosa unremarkable NECK:  No jugular venous distention, waveform within normal limits, carotid pulsus tardus et parvus, no bruits, no thyromegaly LYMPHATICS:  No cervical adenopathy LUNGS:  Clear to auscultation bilaterally HEART:  Irregularly irregular. PMI not displaced or sustained,S1 and S2 within normal limits, no S3, no S4, no clicks, no rubs, III/VI late-peaking murmur at LUSB. ABD:  Flat, positive bowel sounds normal in frequency in pitch, no bruits, no rebound, no guarding, no midline pulsatile mass, no hepatomegaly, no splenomegaly EXT:  2 plus pulses throughout, 2+ pitting edema to ankles bilaterally, no cyanosis no clubbing SKIN:  No rashes no nodules NEURO:  Cranial nerves II through XII grossly intact, motor grossly intact throughout PSYCH:  Cognitively intact, oriented to person place and time    EKG:  EKG is ordered today. The ekg ordered today demonstrates atrial fibrillation.  RBBB.  Rate 79 bpm.  06/06/15 TTE: Study Conclusions  - Left ventricle: The cavity size was normal. Wall thickness was increased in a pattern of severe LVH. Systolic function was normal. The estimated ejection fraction was in the range of 50% to 55%. Wall motion was normal; there were no regional wall motion abnormalities. - Aortic valve: Cusp separation was moderately reduced. There was severe stenosis. Peak velocity (S): 404 cm/s. Mean gradient (S): 38 mm Hg. Valve area (VTI): 0.66 cm^2. Valve area (Vmax): 0.61 cm^2. Valve area (Vmean): 0.62 cm^2. - Mitral valve: Calcified annulus. Mildly thickened leaflets . - Left atrium: The atrium was mildly  dilated. - Right ventricle: The cavity size was mildly dilated. Wall thickness was normal. - Right atrium: The atrium was mildly dilated. - Pulmonary arteries: Systolic pressure was mildly to moderately increased. PA peak pressure: 44 mm Hg (S). - Pericardium, extracardiac: A small pericardial effusion was identified along the right atrial free wall. There was no evidence of hemodynamic compromise.  Abdominal aorta ultrasound 07/26/14: Proximal aorta 1.96 cm, mid aorta 4.21 cm.  Distal aorta not visualized.  No significant change from prior exam 04/03/10 or 01/21/12.  Recent Labs: 06/04/2015: ALT 18; BUN 20; Creatinine, Ser 1.05; Hemoglobin 13.5; Platelets 185.0; Potassium 4.2; Sodium 135; TSH 1.90    Lipid Panel    Component Value Date/Time   CHOL 115 01/29/2015 1609   TRIG 72.0 01/29/2015 1609  HDL 44.10 01/29/2015 1609   CHOLHDL 3 01/29/2015 1609   VLDL 14.4 01/29/2015 1609   LDLCALC 57 01/29/2015 1609      Wt Readings from Last 3 Encounters:  06/04/15 91.627 kg (202 lb)  01/29/15 97.07 kg (214 lb)  07/31/14 97.977 kg (216 lb)      Other studies Reviewed: Additional studies/ records that were reviewed today include: . Review of the above records demonstrates:  Please see elsewhere in the note.     ASSESSMENT AND PLAN:  # Severe aortic stenosis: This is the likely cause of his shortness of breath.  I had a long discussion with Shawn Cortez, his wife, and daughter.  His STS risk score is 4% risk of mortality and 21% risk of morbidity or mortality with surgical AVR.  He is not interested in this and I do not think he would be a good candidate.  He has been referred to talk with Dr. Burt Knack regarding TAVR candidacy.  In the interim, he will think about whether he would be interested in this procedure.  He understands that this is a progressive disease that does not have effective medical management options.  Of note he does have a history of prior stroke, which is a higher  risk with TAVR vs SAVR. - Avoid hypotension - Seek medical care if shortness of breath becomes severe or if he develops chest pain or presyncope  # AAA: Last assessed 06/2014 and was stable from 2 years prior.  Recommend surveillance ultrasound every 2 years, so patient will be due 06/2016.  # Atrial fibrillation: No anticoagulation due to falls and bleeding risk.  Rate well-controlled without nodal agents.  This patients CHA2DS2-VASc Score and unadjusted Ischemic Stroke Rate (% per year) is equal to 4.8 % stroke rate/year from a score of 4.   Above score calculated as 1 point each if present [CHF, HTN, DM, Vascular=MI/PAD/Aortic Plaque, Age if 65-74, or Male] Above score calculated as 2 points each if present [Age > 75, or Stroke/TIA/TE]   # Hyperlipidemia: lipids managed by PCP.  Continue simvastatin  # Prior CVA: Continue plavix.  Current medicines are reviewed at length with the patient today.  The patient does not have concerns regarding medicines.  The following changes have been made:  no change  Labs/ tests ordered today include: none  No orders of the defined types were placed in this encounter.     Disposition:   FU with Dr. Jonelle Sidle C. [place] in 6 months.   Signed, Sharol Harness, MD  07/02/2015 6:57 PM    [place]

## 2015-07-03 ENCOUNTER — Encounter: Payer: Self-pay | Admitting: Cardiovascular Disease

## 2015-07-03 ENCOUNTER — Ambulatory Visit (INDEPENDENT_AMBULATORY_CARE_PROVIDER_SITE_OTHER): Payer: Medicare HMO | Admitting: Cardiovascular Disease

## 2015-07-03 VITALS — BP 106/64 | HR 79 | Ht 76.0 in | Wt 195.5 lb

## 2015-07-03 DIAGNOSIS — I350 Nonrheumatic aortic (valve) stenosis: Secondary | ICD-10-CM | POA: Diagnosis not present

## 2015-07-03 DIAGNOSIS — R011 Cardiac murmur, unspecified: Secondary | ICD-10-CM

## 2015-07-03 DIAGNOSIS — R010 Benign and innocent cardiac murmurs: Secondary | ICD-10-CM

## 2015-07-03 NOTE — Patient Instructions (Signed)
NO CHANGE AT PRESENT TIME.  You have been referred to  Dr Burt Knack - to discuss EVALUATION OF TAVR PROCEDURE   Your physician wants you to follow-up in [place] DR [place]AND[place]. You will receive a reminder letter in the mail two months in advance. If you don't receive a letter, please call our office to schedule the follow-up appointment.

## 2015-07-04 ENCOUNTER — Telehealth: Payer: Self-pay | Admitting: Cardiovascular Disease

## 2015-07-04 ENCOUNTER — Telehealth: Payer: Self-pay | Admitting: Family Medicine

## 2015-07-04 NOTE — Telephone Encounter (Signed)
New message     Pt saw Dr Oval Linsey and said he needs a TAVR consult with Dr Burt Knack.  The wife want to talk to Dr [employer] nurse

## 2015-07-04 NOTE — Telephone Encounter (Signed)
Spoke with pt's wife and she voices understanding.

## 2015-07-04 NOTE — Telephone Encounter (Signed)
Wife wants to know if pt was given b12 shot on 06/04/15 when here for OV. Please call her [phone].

## 2015-07-04 NOTE — Telephone Encounter (Signed)
Pt's wife informed that Dr. Antionette Char nurse is out of the office this week. Advised that one day next week Lauren will be in touch with them regarding getting an appointment sooner than December.  She states the patient is symptomatic and that his symptoms came on suddenly and he would like to be seen ASAP. She is appreciative for the call back and information provided.

## 2015-07-09 NOTE — Telephone Encounter (Signed)
I left a message for the pt's wife to contact the office in regards to scheduling TAVR consult.

## 2015-07-09 NOTE — Telephone Encounter (Signed)
I spoke with the pt's wife and made her aware of first availability for TAVR consult.  Dr Angelena Form has an appointment on 9/14 and Dr Burt Knack has 9/22.  The pt's wife requests appointment on 9/14 with Dr Angelena Form.  Appointment scheduled.

## 2015-07-11 ENCOUNTER — Encounter: Payer: Self-pay | Admitting: Cardiovascular Disease

## 2015-07-11 ENCOUNTER — Encounter: Payer: Self-pay | Admitting: *Deleted

## 2015-07-11 ENCOUNTER — Ambulatory Visit (INDEPENDENT_AMBULATORY_CARE_PROVIDER_SITE_OTHER): Payer: Medicare HMO | Admitting: Cardiovascular Disease

## 2015-07-11 VITALS — BP 102/50 | HR 86 | Ht 76.0 in | Wt 190.8 lb

## 2015-07-11 DIAGNOSIS — I350 Nonrheumatic aortic (valve) stenosis: Secondary | ICD-10-CM

## 2015-07-11 MED ORDER — PREDNISONE 20 MG PO TABS: ORAL_TABLET | ORAL | Status: DC

## 2015-07-11 NOTE — Patient Instructions (Signed)
Medication Instructions:  Your physician recommends that you continue on your current medications as directed. Please refer to the Current Medication list given to you today.   Labwork: Your physician recommends that you return for lab work on July 29, 2014   Testing/Procedures: Your physician has requested that you have a cardiac catheterization. Cardiac catheterization is used to diagnose and/or treat various heart conditions. Doctors may recommend this procedure for a number of different reasons. The most common reason is to evaluate chest pain. Chest pain can be a symptom of coronary artery disease (CAD), and cardiac catheterization can show whether plaque is narrowing or blocking your heart's arteries. This procedure is also used to evaluate the valves, as well as measure the blood flow and oxygen levels in different parts of your heart. For further information please visit [email or web address]. Please follow instruction sheet, as given. Scheduled for October 5,2016    Follow-Up: To be determined after catheterization.     Coronary Angiogram A coronary angiogram, also called coronary angiography, is an X-ray procedure used to look at the arteries in the heart. In this procedure, a dye (contrast dye) is injected through a long, hollow tube (catheter). The catheter is about the size of a piece of cooked spaghetti and is inserted through your groin, wrist, or arm. The dye is injected into each artery, and X-rays are then taken to show if there is a blockage in the arteries of your heart. LET [facility] CARE PROVIDER KNOW ABOUT:  Any allergies you have, including allergies to shellfish or contrast dye.   All medicines you are taking, including vitamins, herbs, eye drops, creams, and over-the-counter medicines.   Previous problems you or members of your family have had with the use of anesthetics.   Any blood disorders you have.   Previous surgeries you have had.  History of  kidney problems or failure.   Other medical conditions you have. RISKS AND COMPLICATIONS  Generally, a coronary angiogram is a safe procedure. However, problems can occur and include:  Allergic reaction to the dye.  Bleeding from the access site or other locations.  Kidney injury, especially in people with impaired kidney function.  Stroke (rare).  Heart attack (rare). BEFORE THE PROCEDURE   Do not eat or drink anything after midnight the night before the procedure or as directed by your health care provider.   Ask your health care provider about changing or stopping your regular medicines. This is especially important if you are taking diabetes medicines or blood thinners. PROCEDURE  You may be given a medicine to help you relax (sedative) before the procedure. This medicine is given through an intravenous (IV) access tube that is inserted into one of your veins.   The area where the catheter will be inserted will be washed and shaved. This is usually done in the groin but may be done in the fold of your arm (near your elbow) or in the wrist.   A medicine will be given to numb the area where the catheter will be inserted (local anesthetic).   The health care provider will insert the catheter into an artery. The catheter will be guided by using a special type of X-ray (fluoroscopy) of the blood vessel being examined.   A special dye will then be injected into the catheter, and X-rays will be taken. The dye will help to show where any narrowing or blockages are located in the heart arteries.  AFTER THE PROCEDURE   If the procedure is  done through the leg, you will be kept in bed lying flat for several hours. You will be instructed to not bend or cross your legs.  The insertion site will be checked frequently.   The pulse in your feet or wrist will be checked frequently.   Additional blood tests, X-rays, and an electrocardiogram may be done.  Document Released:  04/19/2003 Document Revised: 02/27/2014 Document Reviewed: 03/07/2013 [facility] Patient Information 2015 [place], [place]. This information is not intended to replace advice given to you by your health care provider. Make sure you discuss any questions you have with your health care provider.

## 2015-07-11 NOTE — Progress Notes (Signed)
Multi-Disciplinary Valve Clinic Note   History of Present Illness: 81 yo male with history of atrial fibrillation, TIA, hyperlipidemia, AAA and aortic stenosis who is referred today for further discussion of treatment options for his aortic stenosis. He was noted to be in atrial fibrillation during an office visit in primary care in August 2016. He was also noted to have a murmur at that time. Echo 06/06/15 with severe LVH, normal LV systolic function, severe aortic valve stenosis (mean gradient 38 mm Hg, AVA 0.66cm2). He was seen in our office 07/03/15 by Dr. Oval Linsey. The plan has been to continue Plavix with history of TIA. He has not been anti-coagulated due to fall risk and bleeding risk. His wife reports arrhythmia for 35 years. He has not been on coumadin in the past. He has had progressive symptoms over the last few months with increasing dyspnea with exertion and fatigue. He is limited by weakness in his legs but even walking across the room with his walker causes profound dyspnea. He has baseline lower extremity edema for many years. This has not changed over the last year. His edema improves at night. No chest pain, near syncope or syncope. 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 He lives at home with his wife. He uses a walker at home to ambulate. He has had a previous stroke with right sided weakness following the stroke. He has at least mild memory impairment and is hard of hearing.   Primary Care Physician: Charlett Blake  Past Medical History  Diagnosis Date  . Arthritis   . SOB (shortness of breath) on exertion   . Hyperlipidemia   . Stroke     TIA history 2005  . Hyperglycemia 01/04/2012  . Leg pain, right 03/01/2012  . Right leg weakness 03/01/2012  . Skin lesion of right arm 09/14/2012  . Abrasion of hand 09/14/2012  . Cerumen impaction 09/14/2012  . Unsteady gait 12/26/2012  . Insomnia 12/26/2012  . Bilateral groin pain 02/13/2013    L>R  . Peripheral edema 03/29/2013  . Preventative health care  06/26/2013  . AAA (abdominal aortic aneurysm)   . Atrial fibrillation     Past Surgical History  Procedure Laterality Date  . Thumb tendon severed and repaired      right, tendon snapped  . Carpal tunnel release      Right  . Achilles tendon repair      left, repaired w/ wire  . Brain surgery      done by Dr Lizabeth Leyden for tinnitius, unsuccessful, only on right  . Inguinal herniorrhapy  years ago    right and then 2 on left  . Lumbar laminectomy      L3-L5 posterior laminectomy 2006  . Joint replacement      Bilateral total knee replacement  . Spine surgery      Current Outpatient Prescriptions  Medication Sig Dispense Refill  . clopidogrel (PLAVIX) 75 MG tablet TAKE 1 TABLET (75 MG TOTAL) BY MOUTH AT BEDTIME. 90 tablet 1  . fish oil-omega-3 fatty acids 1000 MG capsule Take 500 mg by mouth daily. 2 daily     . LORazepam (ATIVAN) 0.5 MG tablet Take 1 tablet (0.5 mg total) by mouth at bedtime as needed for anxiety. 90 tablet 0  . simvastatin (ZOCOR) 40 MG tablet Take 1 tablet (40 mg total) by mouth daily at 6 PM. 90 tablet 1  . predniSONE (DELTASONE) 20 MG tablet Take 3 tablets at 6 PM and 10 PM evening before  procedure and at 8 AM day of procedure 9 tablet 0   No current facility-administered medications for this visit.    Allergies  Allergen Reactions  . Niacin And Related Rash    Facial redness and Rash head to toe  . Omnipaque [Iohexol] Rash    Facial redness and flushing    Social History   Social History  . Marital Status: Married    Spouse Name: N/A  . Number of Children: N/A  . Years of Education: N/A   Occupational History  . Retired    Social History Main Topics  . Smoking status: Former Smoker -- 3.00 packs/day    Quit date: 10/16/1960  . Smokeless tobacco: Never Used  . Alcohol Use: 8.4 oz/week    14 Cans of beer per week     Comment: 1-2 beer daily  . Drug Use: No  . Sexual Activity: Not on file   Other Topics Concern  . Not on file   Social  History Narrative    Family History  Problem Relation Age of Onset  . Other Mother     CHF  . Ulcers Mother     Venous stasis  . Other Father     CHF  . Cancer Sister     unknown  . Hip fracture Maternal Grandmother   . Pneumonia Maternal Grandmother     Review of Systems:  As stated in the HPI and otherwise negative.   BP 102/50 mmHg  Pulse 86  Ht 6\' 4"  (1.93 m)  Wt 190 lb 12.8 oz (86.546 kg)  BMI 23.23 kg/m2  SpO2 98%  Physical Examination: General: Well developed, well nourished, NAD HEENT: OP clear, mucus membranes moist SKIN: warm, dry. No rashes. Neuro: No focal deficits Musculoskeletal: Muscle strength 5/5 all ext Psychiatric: Mood and affect normal Neck: No JVD, no carotid bruits, no thyromegaly, no lymphadenopathy. Lungs:Clear bilaterally, no wheezes, rhonci, crackles Cardiovascular: Irregular irregular with loud harsh systolic murmru. No gallops or rubs. Abdomen:Soft. Bowel sounds present. Non-tender.  Extremities: 1+ bilateral lower extremity edema. Pulses are 2 + in the bilateral DP/PT.  Echo 06/06/15: Left ventricle: The cavity size was normal. Wall thickness was increased in a pattern of severe LVH. Systolic function was normal. The estimated ejection fraction was in the range of 50% to 55%. Wall motion was normal; there were no regional wall motion abnormalities. - Aortic valve: Cusp separation was moderately reduced. There was severe stenosis. Peak velocity (S): 404 cm/s. Mean gradient (S): 38 mm Hg. Valve area (VTI): 0.66 cm^2. Valve area (Vmax): 0.61 cm^2. Valve area (Vmean): 0.62 cm^2. - Mitral valve: Calcified annulus. Mildly thickened leaflets . - Left atrium: The atrium was mildly dilated. - Right ventricle: The cavity size was mildly dilated. Wall thickness was normal. - Right atrium: The atrium was mildly dilated. - Pulmonary arteries: Systolic pressure was mildly to moderately increased. PA peak pressure: 44 mm Hg  (S). - Pericardium, extracardiac: A small pericardial effusion was identified along the right atrial free wall. There was no evidence of hemodynamic compromise.  Impressions:  - Aortic stenosis has progressed when compared to prior echocardiogram.  EKG:  EKG is not ordered today. The ekg ordered today demonstrates   Recent Labs: 06/04/2015: ALT 18; BUN 20; Creatinine, Ser 1.05; Hemoglobin 13.5; Platelets 185.0; Potassium 4.2; Sodium 135; TSH 1.90   Lipid Panel    Component Value Date/Time   CHOL 115 01/29/2015 1609   TRIG 72.0 01/29/2015 1609   HDL 44.10 01/29/2015 1609  CHOLHDL 3 01/29/2015 1609   VLDL 14.4 01/29/2015 1609   LDLCALC 57 01/29/2015 1609     Wt Readings from Last 3 Encounters:  07/11/15 190 lb 12.8 oz (86.546 kg)  07/03/15 195 lb 8 oz (88.678 kg)  06/04/15 202 lb (91.627 kg)     Other studies Reviewed: Additional studies/ records that were reviewed today include: . Review of the above records demonstrates:   STS Risk Calculator:  Risk of Mortality: 2.079%  Morbidity or Mortality: 16.339%  Long Length of Stay: 8.078%  Short Length of Stay: 26.565%  Permanent Stroke: 2.661%  Prolonged Ventilation: 8.556%  DSW Infection: 0.292%  Renal Failure: 4.113%  Reoperation: 8.435%     Assessment and Plan:   1. Severe aortic valve stenosis: He has stage D symptomatic aortic stenosis. I have personally reviewed his echo images. He has thickened aortic valve leaflets with poor leaflet excursion. Mean gradient is 38 mm Hg. I think that he would benefit from AVR but he is not a good candidate for conventional surgical AVR given advanced age and relative immobility. He appears to be a good candidate for TAVR. He is willing to proceed with workup for TAVR. I have reviewed the TAVR procedure with the patient, his wife and his daughter including risks associated with the procedure. It is reasonable to think he may receive some relief of his dyspnea and fatigue if we  proceed with TAVR. Will begin by arranging right and left heart cath on 08/01/15 at [facility]. Risks and benefits of procedure reviewed. Pre-cath labs week of cath. Pre-treatment for contrast allergy. I will ask Dr. Cyndia Bent or Dr. Roxy Manns to see him after his cath. Will arrange CT scans and PFTs following cath.   Current medicines are reviewed at length with the patient today.  The patient does not have concerns regarding medicines.  The following changes have been made:  no change  Labs/ tests ordered today include: Cardiac cath/pre-cath labs  Orders Placed This Encounter  Procedures  . CBC w/Diff  . INR/PT  . Basic Metabolic Panel (BMET)    Disposition:   FU with CT surgery after cath for further planning of TAVR   Signed, Lauree Chandler, MD 07/11/2015 1:23 PM    [facility] [place], [place], [place]  [address] Phone: [phone]; Fax: [phone]

## 2015-07-13 ENCOUNTER — Telehealth: Payer: Self-pay | Admitting: Cardiovascular Disease

## 2015-07-13 ENCOUNTER — Other Ambulatory Visit: Payer: Self-pay | Admitting: Family Medicine

## 2015-07-13 DIAGNOSIS — I350 Nonrheumatic aortic (valve) stenosis: Secondary | ICD-10-CM

## 2015-07-13 NOTE — Telephone Encounter (Signed)
LOV and Labs 06/04/15  clopidogrel (PLAVIX) 75 MG tablet BLYTH, STACEY A 90 tablet 1 Summary: TAKE 1 TABLET (75 MG TOTAL) BY MOUTH AT BEDTIME., Normal   simvastatin (ZOCOR) 40 MG tablet 40 mg, Daily-1800 BLYTH, STACEY A 90 tablet 1  Summary: Take 1 tablet (40 mg total) by mouth daily at 6 PM., Starting 01/02/2015,

## 2015-07-13 NOTE — Addendum Note (Signed)
Addended by: Thompson Grayer on: 07/13/2015 12:52 PM   Modules accepted: Orders

## 2015-07-13 NOTE — Telephone Encounter (Signed)
New message    Pt wife has questions regarding upcoming procedure Please call to discuss

## 2015-07-13 NOTE — Telephone Encounter (Signed)
Spoke with pt's wife. Pt and his wife have decided to move to retirement facility soon.  Wife is calling to cancel cath as pt wants to get settled into new apartment before having cath.   They would like to plan on cath in late November or early December. Wife is asking if OK for pt to wait.  I told her to monitor symptoms and if they worsen to let us know.  I told her I would make Dr. Angelena Form aware and if he had concerns about waiting for cath I would let her know.  Wife made aware pt may need another office visit with Dr. Angelena Form prior to cath.  She is OK with this and will call us when pt is ready to proceed with cath. Will make cath lab aware.

## 2015-07-13 NOTE — Telephone Encounter (Signed)
Phone busy. Will continue to try to reach pt.  

## 2015-07-16 ENCOUNTER — Telehealth: Payer: Self-pay | Admitting: Cardiovascular Disease

## 2015-07-16 ENCOUNTER — Other Ambulatory Visit: Payer: Self-pay | Admitting: Family Medicine

## 2015-07-16 DIAGNOSIS — G4700 Insomnia, unspecified: Secondary | ICD-10-CM

## 2015-07-16 MED ORDER — LORAZEPAM 0.5 MG PO TABS
0.5000 mg | ORAL_TABLET | Freq: Every evening | ORAL | Status: DC | PRN
Start: 2015-07-16 — End: 2015-08-20

## 2015-07-16 NOTE — Telephone Encounter (Signed)
Requesting:  Lorazepam Contract   07/31/14 UDS    NONE Last OV   06/14/15 Last Refill    #90 with 0 refills on 04/02/15  Pharmacy [facility] [place]  Please Advise

## 2015-07-16 NOTE — Telephone Encounter (Signed)
New message      Talk to [facility].  She would not tell me what she wanted

## 2015-07-16 NOTE — Telephone Encounter (Signed)
See phone noted dated 07/13/15 which addresses this.

## 2015-07-16 NOTE — Telephone Encounter (Signed)
Thanks. chris 

## 2015-07-16 NOTE — Telephone Encounter (Signed)
Faxed hardcopy for Lorazepam to [facility]

## 2015-07-16 NOTE — Telephone Encounter (Signed)
Pt's wife called back this AM and move to retirement facility did not work out.  She would like to reschedule cath for pt.  I spoke with cath lab and cath rescheduled for October 5,2016 at 10:00.  Wife has previous instruction sheet and is aware to follow these instructions.  Pt will come in for pre-cath labs on October 3,2016

## 2015-07-20 ENCOUNTER — Ambulatory Visit (INDEPENDENT_AMBULATORY_CARE_PROVIDER_SITE_OTHER): Payer: Medicare HMO | Admitting: Family Medicine

## 2015-07-20 ENCOUNTER — Encounter: Payer: Self-pay | Admitting: Family Medicine

## 2015-07-20 VITALS — BP 108/78 | HR 73 | Temp 98.3°F | Resp 16 | Wt 190.0 lb

## 2015-07-20 DIAGNOSIS — R739 Hyperglycemia, unspecified: Secondary | ICD-10-CM | POA: Diagnosis not present

## 2015-07-20 DIAGNOSIS — G609 Hereditary and idiopathic neuropathy, unspecified: Secondary | ICD-10-CM | POA: Diagnosis not present

## 2015-07-20 DIAGNOSIS — E785 Hyperlipidemia, unspecified: Secondary | ICD-10-CM

## 2015-07-20 DIAGNOSIS — I350 Nonrheumatic aortic (valve) stenosis: Secondary | ICD-10-CM | POA: Diagnosis not present

## 2015-07-20 DIAGNOSIS — G4700 Insomnia, unspecified: Secondary | ICD-10-CM

## 2015-07-20 MED ORDER — MUPIROCIN 2 % EX OINT: 1.0000 "application " | TOPICAL_OINTMENT | Freq: Every day | CUTANEOUS | Status: DC

## 2015-07-20 NOTE — Patient Instructions (Signed)

## 2015-07-20 NOTE — Progress Notes (Signed)
Pre visit review using our clinic review tool, if applicable. No additional management support is needed unless otherwise documented below in the visit note. 

## 2015-07-29 ENCOUNTER — Encounter: Payer: Self-pay | Admitting: Family Medicine

## 2015-07-29 NOTE — Assessment & Plan Note (Signed)
Encouraged good sleep hygiene such as dark, quiet room. No blue/green glowing lights such as computer screens in bedroom. No alcohol or stimulants in evening. Cut down on caffeine as able. Regular exercise is helpful but not just prior to bed time. Lorazepam prn

## 2015-07-29 NOTE — Assessment & Plan Note (Signed)
He is in the process of being worked up for surgical correction.

## 2015-07-29 NOTE — Assessment & Plan Note (Signed)
minimize simple carbs. Increase exercise as tolerated.  

## 2015-07-29 NOTE — Assessment & Plan Note (Addendum)
Tolerating statin, encouraged heart healthy diet, avoid trans fats, minimize simple carbs and saturated fats. Increase exercise as tolerated 

## 2015-07-29 NOTE — Assessment & Plan Note (Signed)
Ongoing but managing without medications

## 2015-07-29 NOTE — Progress Notes (Signed)
Subjective:    Patient ID: Shawn Cortez, male    DOB: 02/25/1925, 81 y.o.   MRN: [id number]  Chief Complaint  Patient presents with  . Follow-up    right side of nose x 3 days has been blood when he wipes nose     HPI Patient is in today for follow-up. He is accompanied by his wife and his son. They are in the process of evaluating whether or not he will be able to undergo his aortic valve repair. He continues to struggle with shortness of breath and weakness secondary to his severe aortic stenosis. He is also having a flare in his tinnitus. He's had trouble with it off and on for 50 years. Peripheral neuropathy is ongoing but not worse. Otherwise she reports generally feeling well. Has some mild nasal congestion and occasional streaks of epistaxis. Denies CP/palp/HA/congestion/fevers/GI or GU c/o. Taking meds as prescribed  Past Medical History  Diagnosis Date  . Arthritis   . SOB (shortness of breath) on exertion   . Hyperlipidemia   . Stroke [facility])     TIA history 2005  . Hyperglycemia 01/04/2012  . Leg pain, right 03/01/2012  . Right leg weakness 03/01/2012  . Skin lesion of right arm 09/14/2012  . Abrasion of hand 09/14/2012  . Cerumen impaction 09/14/2012  . Unsteady gait 12/26/2012  . Insomnia 12/26/2012  . Bilateral groin pain 02/13/2013    L>R  . Peripheral edema 03/29/2013  . Preventative health care 06/26/2013  . AAA (abdominal aortic aneurysm) ([place])   . Atrial fibrillation [facility])     Past Surgical History  Procedure Laterality Date  . Thumb tendon severed and repaired      right, tendon snapped  . Carpal tunnel release      Right  . Achilles tendon repair      left, repaired w/ wire  . Brain surgery      done by Dr Lizabeth Leyden for tinnitius, unsuccessful, only on right  . Inguinal herniorrhapy  years ago    right and then 2 on left  . Lumbar laminectomy      L3-L5 posterior laminectomy 2006  . Joint replacement      Bilateral total knee replacement  . Spine  surgery      Family History  Problem Relation Age of Onset  . Other Mother     CHF  . Ulcers Mother     Venous stasis  . Other Father     CHF  . Cancer Sister     unknown  . Hip fracture Maternal Grandmother   . Pneumonia Maternal Grandmother     Social History   Social History  . Marital Status: Married    Spouse Name: N/A  . Number of Children: N/A  . Years of Education: N/A   Occupational History  . Retired    Social History Main Topics  . Smoking status: Former Smoker -- 3.00 packs/day    Quit date: 10/16/1960  . Smokeless tobacco: Never Used  . Alcohol Use: 8.4 oz/week    14 Cans of beer per week     Comment: 1-2 beer daily  . Drug Use: No  . Sexual Activity: Not on file   Other Topics Concern  . Not on file   Social History Narrative    Outpatient Prescriptions Prior to Visit  Medication Sig Dispense Refill  . clopidogrel (PLAVIX) 75 MG tablet TAKE 1 TABLET BY MOUTH ATBEDTIME. 90 tablet 1  . fish oil-omega-3  fatty acids 1000 MG capsule Take 500 mg by mouth daily. 2 daily     . LORazepam (ATIVAN) 0.5 MG tablet Take 1 tablet (0.5 mg total) by mouth at bedtime as needed for anxiety. 90 tablet 0  . simvastatin (ZOCOR) 40 MG tablet TAKE 1 TABLET BY MOUTH DAILY AT 6 PM. 90 tablet 1  . predniSONE (DELTASONE) 20 MG tablet Take 3 tablets at 6 PM and 10 PM evening before procedure and at 8 AM day of procedure (Patient not taking: Reported on 07/20/2015) 9 tablet 0   No facility-administered medications prior to visit.    Allergies  Allergen Reactions  . Niacin And Related Rash    Facial redness and Rash head to toe  . Omnipaque [Iohexol] Rash    Facial redness and flushing    Review of Systems  Constitutional: Positive for malaise/fatigue. Negative for fever.  HENT: Negative for congestion.   Eyes: Negative for discharge.  Respiratory: Positive for shortness of breath.   Cardiovascular: Negative for chest pain, palpitations and leg swelling.    Gastrointestinal: Negative for nausea and abdominal pain.  Genitourinary: Negative for dysuria.  Musculoskeletal: Negative for falls.  Skin: Negative for rash.  Neurological: Positive for tingling. Negative for loss of consciousness and headaches.  Endo/Heme/Allergies: Negative for environmental allergies.  Psychiatric/Behavioral: Negative for depression. The patient is not nervous/anxious.        Objective:    Physical Exam  Constitutional: He is oriented to person, place, and time. He appears well-developed and well-nourished. No distress.  HENT:  Head: Normocephalic and atraumatic.  Nose: Nose normal.  Eyes: Right eye exhibits no discharge. Left eye exhibits no discharge.  Neck: Normal range of motion. Neck supple.  Cardiovascular: Normal rate.   Murmur heard. Irregularly irregular  Pulmonary/Chest: Effort normal and breath sounds normal.  Abdominal: Soft. Bowel sounds are normal. There is no tenderness.  Musculoskeletal: He exhibits no edema.  Neurological: He is alert and oriented to person, place, and time.  Skin: Skin is warm and dry.  Psychiatric: He has a normal mood and affect.  Nursing note and vitals reviewed.   BP 108/78 mmHg  Pulse 73  Temp(Src) 98.3 F (36.8 C) (Oral)  Resp 16  Wt 190 lb (86.183 kg)  SpO2 96% Wt Readings from Last 3 Encounters:  07/20/15 190 lb (86.183 kg)  07/11/15 190 lb 12.8 oz (86.546 kg)  07/03/15 195 lb 8 oz (88.678 kg)     Lab Results  Component Value Date   WBC 7.6 06/04/2015   HGB 13.5 06/04/2015   HCT 41.3 06/04/2015   PLT 185.0 06/04/2015   GLUCOSE 101* 06/04/2015   CHOL 115 01/29/2015   TRIG 72.0 01/29/2015   HDL 44.10 01/29/2015   LDLCALC 57 01/29/2015   ALT 18 06/04/2015   AST 22 06/04/2015   NA 135 06/04/2015   K 4.2 06/04/2015   CL 102 06/04/2015   CREATININE 1.05 06/04/2015   BUN 20 06/04/2015   CO2 21 06/04/2015   TSH 1.90 06/04/2015   INR 1.0 11/06/2008   HGBA1C 6.1 06/04/2015    Lab Results   Component Value Date   TSH 1.90 06/04/2015   Lab Results  Component Value Date   WBC 7.6 06/04/2015   HGB 13.5 06/04/2015   HCT 41.3 06/04/2015   MCV 92.0 06/04/2015   PLT 185.0 06/04/2015   Lab Results  Component Value Date   NA 135 06/04/2015   K 4.2 06/04/2015   CO2 21 06/04/2015  GLUCOSE 101* 06/04/2015   BUN 20 06/04/2015   CREATININE 1.05 06/04/2015   BILITOT 1.1 06/04/2015   ALKPHOS 67 06/04/2015   AST 22 06/04/2015   ALT 18 06/04/2015   PROT 7.2 06/04/2015   ALBUMIN 4.0 06/04/2015   CALCIUM 9.2 06/04/2015   GFR 70.83 06/04/2015   Lab Results  Component Value Date   CHOL 115 01/29/2015   Lab Results  Component Value Date   HDL 44.10 01/29/2015   Lab Results  Component Value Date   LDLCALC 57 01/29/2015   Lab Results  Component Value Date   TRIG 72.0 01/29/2015   Lab Results  Component Value Date   CHOLHDL 3 01/29/2015   Lab Results  Component Value Date   HGBA1C 6.1 06/04/2015       Assessment & Plan:   Problem List Items Addressed This Visit    Insomnia    Encouraged good sleep hygiene such as dark, quiet room. No blue/green glowing lights such as computer screens in bedroom. No alcohol or stimulants in evening. Cut down on caffeine as able. Regular exercise is helpful but not just prior to bed time. Lorazepam prn      Hyperlipidemia - Primary    Tolerating statin, encouraged heart healthy diet, avoid trans fats, minimize simple carbs and saturated fats. Increase exercise as tolerated      Hyperglycemia    minimize simple carbs. Increase exercise as tolerated.       Hereditary and idiopathic peripheral neuropathy    Ongoing but managing without medications      Aortic stenosis, severe    He is in the process of being worked up for surgical correction.          I am having Mr. Vanover start on mupirocin ointment. I am also having him maintain his fish oil-omega-3 fatty acids, predniSONE, clopidogrel, simvastatin, and  LORazepam.  Meds ordered this encounter  Medications  . mupirocin ointment (BACTROBAN) 2 %    Sig: Place 1 application into the nose daily. Apply to nares via qtip qhs x 7 days    Dispense:  22 g    Refill:  0     Penni Homans, MD

## 2015-07-30 ENCOUNTER — Other Ambulatory Visit: Payer: Medicare HMO

## 2015-07-30 ENCOUNTER — Other Ambulatory Visit (INDEPENDENT_AMBULATORY_CARE_PROVIDER_SITE_OTHER): Payer: Medicare HMO | Admitting: *Deleted

## 2015-07-30 DIAGNOSIS — I350 Nonrheumatic aortic (valve) stenosis: Secondary | ICD-10-CM | POA: Diagnosis not present

## 2015-07-30 LAB — BASIC METABOLIC PANEL
BUN: 23 mg/dL (ref 6–23)
CO2: 28 mEq/L (ref 19–32)
Calcium: 8.9 mg/dL (ref 8.4–10.5)
Chloride: 102 mEq/L (ref 96–112)
Creatinine, Ser: 0.98 mg/dL (ref 0.40–1.50)
GFR: 76.67 mL/min (ref >60.00)
Glucose, Bld: 93 mg/dL (ref 70–99)
POTASSIUM: 4.2 meq/L (ref 3.5–5.1)
SODIUM: 138 meq/L (ref 135–145)

## 2015-07-30 LAB — CBC
HCT: 37.3 % — ABNORMAL LOW (ref 39.0–52.0)
HEMOGLOBIN: 12.3 g/dL — AB (ref 13.0–17.0)
MCHC: 33 g/dL (ref 30.0–36.0)
MCV: 92.5 fl (ref 78.0–100.0)
PLATELETS: 149 10*3/uL — AB (ref 150.0–400.0)
RBC: 4.04 Mil/uL — AB (ref 4.22–5.81)
RDW: 15.9 % — ABNORMAL HIGH (ref 11.5–15.5)
WBC: 5.6 10*3/uL (ref 4.0–10.5)

## 2015-07-30 LAB — PROTIME-INR
INR: 1.1 ratio — ABNORMAL HIGH (ref 0.8–1.0)
Prothrombin Time: 12.1 s (ref 9.6–13.1)

## 2015-08-01 ENCOUNTER — Encounter (HOSPITAL_COMMUNITY)
Admission: RE | Disposition: A | Discharge status: Home / Self Care | Payer: Self-pay | Source: Ambulatory Visit | Attending: Cardiovascular Disease

## 2015-08-01 ENCOUNTER — Encounter (HOSPITAL_COMMUNITY): Payer: Self-pay

## 2015-08-01 ENCOUNTER — Telehealth: Payer: Self-pay | Admitting: *Deleted

## 2015-08-01 ENCOUNTER — Ambulatory Visit (HOSPITAL_COMMUNITY)
Admission: RE | Admit: 2015-08-01 | Discharge: 2015-08-01 | Disposition: A | Discharge status: Home / Self Care | Payer: Medicare HMO | Source: Ambulatory Visit | Attending: Cardiovascular Disease | Admitting: Cardiovascular Disease

## 2015-08-01 ENCOUNTER — Ambulatory Visit (HOSPITAL_COMMUNITY): Admit: 2015-08-01 | Payer: Medicare HMO | Admitting: Cardiovascular Disease

## 2015-08-01 DIAGNOSIS — I714 Abdominal aortic aneurysm, without rupture: Secondary | ICD-10-CM | POA: Diagnosis not present

## 2015-08-01 DIAGNOSIS — R739 Hyperglycemia, unspecified: Secondary | ICD-10-CM | POA: Diagnosis not present

## 2015-08-01 DIAGNOSIS — E785 Hyperlipidemia, unspecified: Secondary | ICD-10-CM | POA: Insufficient documentation

## 2015-08-01 DIAGNOSIS — Y840 Cardiac catheterization as the cause of abnormal reaction of the patient, or of later complication, without mention of misadventure at the time of the procedure: Secondary | ICD-10-CM | POA: Diagnosis not present

## 2015-08-01 DIAGNOSIS — I2510 Atherosclerotic heart disease of native coronary artery without angina pectoris: Secondary | ICD-10-CM | POA: Insufficient documentation

## 2015-08-01 DIAGNOSIS — Z87891 Personal history of nicotine dependence: Secondary | ICD-10-CM | POA: Insufficient documentation

## 2015-08-01 DIAGNOSIS — Z96653 Presence of artificial knee joint, bilateral: Secondary | ICD-10-CM | POA: Diagnosis not present

## 2015-08-01 DIAGNOSIS — Z7902 Long term (current) use of antithrombotics/antiplatelets: Secondary | ICD-10-CM | POA: Diagnosis not present

## 2015-08-01 DIAGNOSIS — R5383 Other fatigue: Secondary | ICD-10-CM | POA: Insufficient documentation

## 2015-08-01 DIAGNOSIS — I350 Nonrheumatic aortic (valve) stenosis: Secondary | ICD-10-CM | POA: Insufficient documentation

## 2015-08-01 DIAGNOSIS — I97630 Postprocedural hematoma of a circulatory system organ or structure following a cardiac catheterization: Secondary | ICD-10-CM | POA: Insufficient documentation

## 2015-08-01 DIAGNOSIS — I69351 Hemiplegia and hemiparesis following cerebral infarction affecting right dominant side: Secondary | ICD-10-CM | POA: Insufficient documentation

## 2015-08-01 DIAGNOSIS — Z91041 Radiographic dye allergy status: Secondary | ICD-10-CM | POA: Diagnosis not present

## 2015-08-01 DIAGNOSIS — I4891 Unspecified atrial fibrillation: Secondary | ICD-10-CM | POA: Diagnosis not present

## 2015-08-01 HISTORY — DX: Atherosclerotic heart disease of native coronary artery without angina pectoris: I25.10

## 2015-08-01 HISTORY — PX: CARDIAC CATHETERIZATION: SHX172

## 2015-08-01 LAB — POCT I-STAT 3, ART BLOOD GAS (G3+)
BICARBONATE: 23.5 meq/L (ref 20.0–24.0)
O2 Saturation: 99 %
PO2 ART: 125 mmHg — AB (ref 80.0–100.0)
TCO2: 25 mmol/L (ref 0–100)
pCO2 arterial: 34.8 mmHg — ABNORMAL LOW (ref 35.0–45.0)
pH, Arterial: 7.438 (ref 7.350–7.450)

## 2015-08-01 LAB — POCT I-STAT 3, VENOUS BLOOD GAS (G3P V)
Acid-base deficit: 1 mmol/L (ref 0.0–2.0)
Bicarbonate: 24 mEq/L (ref 20.0–24.0)
O2 SAT: 67 %
PCO2 VEN: 38.3 mmHg — AB (ref 45.0–50.0)
PO2 VEN: 35 mmHg (ref 30.0–45.0)
TCO2: 25 mmol/L (ref 0–100)
pH, Ven: 7.405 — ABNORMAL HIGH (ref 7.250–7.300)

## 2015-08-01 LAB — POCT ACTIVATED CLOTTING TIME: Activated Clotting Time: 177 seconds

## 2015-08-01 SURGERY — RIGHT/LEFT HEART CATH AND CORONARY ANGIOGRAPHY
Anesthesia: LOCAL

## 2015-08-01 SURGERY — RIGHT/LEFT HEART CATH AND CORONARY ANGIOGRAPHY

## 2015-08-01 MED ORDER — ASPIRIN 81 MG PO CHEW
81.0000 mg | CHEWABLE_TABLET | ORAL | Status: AC
  Administered 2015-08-01: 81 mg via ORAL

## 2015-08-01 MED ORDER — HEPARIN (PORCINE) IN NACL 2-0.9 UNIT/ML-% IJ SOLN
INTRAMUSCULAR | Status: AC
  Filled 2015-08-01: qty 1000

## 2015-08-01 MED ORDER — DIPHENHYDRAMINE HCL 50 MG/ML IJ SOLN
25.0000 mg | Freq: Once | INTRAMUSCULAR | Status: AC
  Administered 2015-08-01: 25 mg via INTRAVENOUS

## 2015-08-01 MED ORDER — LIDOCAINE HCL (PF) 1 % IJ SOLN
INTRAMUSCULAR | Status: AC
  Filled 2015-08-01: qty 30

## 2015-08-01 MED ORDER — FAMOTIDINE IN NACL 20-0.9 MG/50ML-% IV SOLN
INTRAVENOUS | Status: AC
  Administered 2015-08-01: 20 mg via INTRAVENOUS
  Filled 2015-08-01: qty 50

## 2015-08-01 MED ORDER — HEPARIN SODIUM (PORCINE) 1000 UNIT/ML IJ SOLN
INTRAMUSCULAR | Status: AC
  Filled 2015-08-01: qty 1

## 2015-08-01 MED ORDER — ASPIRIN 81 MG PO CHEW
CHEWABLE_TABLET | ORAL | Status: AC
  Administered 2015-08-01: 81 mg via ORAL
  Filled 2015-08-01: qty 1

## 2015-08-01 MED ORDER — IOHEXOL 350 MG/ML SOLN
INTRAVENOUS | Status: DC | PRN
  Administered 2015-08-01: 60 mL via INTRA_ARTERIAL

## 2015-08-01 MED ORDER — SODIUM CHLORIDE 0.9 % IJ SOLN: 3.0000 mL | Freq: Two times a day (BID) | INTRAMUSCULAR | Status: DC

## 2015-08-01 MED ORDER — SODIUM CHLORIDE 0.9 % IJ SOLN: 3.0000 mL | INTRAMUSCULAR | Status: DC | PRN

## 2015-08-01 MED ORDER — MIDAZOLAM HCL 2 MG/2ML IJ SOLN
INTRAMUSCULAR | Status: DC | PRN
  Administered 2015-08-01 (×2): 1 mg via INTRAVENOUS

## 2015-08-01 MED ORDER — SODIUM CHLORIDE 0.9 % IV SOLN: 250.0000 mL | INTRAVENOUS | Status: DC | PRN

## 2015-08-01 MED ORDER — SODIUM CHLORIDE 0.9 % IV SOLN
INTRAVENOUS | Status: AC
  Administered 2015-08-01: 10:00:00 via INTRAVENOUS

## 2015-08-01 MED ORDER — VERAPAMIL HCL 2.5 MG/ML IV SOLN
INTRAVENOUS | Status: DC | PRN
  Administered 2015-08-01: 11:00:00 via INTRA_ARTERIAL

## 2015-08-01 MED ORDER — DIPHENHYDRAMINE HCL 50 MG/ML IJ SOLN
INTRAMUSCULAR | Status: AC
  Administered 2015-08-01: 25 mg via INTRAVENOUS
  Filled 2015-08-01: qty 1

## 2015-08-01 MED ORDER — SODIUM CHLORIDE 0.9 % IV SOLN: INTRAVENOUS | Status: AC

## 2015-08-01 MED ORDER — VERAPAMIL HCL 2.5 MG/ML IV SOLN
INTRAVENOUS | Status: AC
  Filled 2015-08-01: qty 2

## 2015-08-01 MED ORDER — MIDAZOLAM HCL 2 MG/2ML IJ SOLN
INTRAMUSCULAR | Status: AC
  Filled 2015-08-01: qty 4

## 2015-08-01 MED ORDER — HEPARIN SODIUM (PORCINE) 1000 UNIT/ML IJ SOLN
INTRAMUSCULAR | Status: DC | PRN
  Administered 2015-08-01: 4300 [IU] via INTRAVENOUS

## 2015-08-01 MED ORDER — FAMOTIDINE IN NACL 20-0.9 MG/50ML-% IV SOLN
20.0000 mg | Freq: Once | INTRAVENOUS | Status: AC
  Administered 2015-08-01: 20 mg via INTRAVENOUS

## 2015-08-01 MED ORDER — LIDOCAINE HCL (PF) 1 % IJ SOLN
INTRAMUSCULAR | Status: DC | PRN
  Administered 2015-08-01: 11:00:00

## 2015-08-01 SURGICAL SUPPLY — 21 items
CATH BALLN WEDGE 5F 110CM (CATHETERS) ×2 IMPLANT
CATH EXPO 5F MPA-1 (CATHETERS) ×2 IMPLANT
CATH INFINITI 5 FR AL2 (CATHETERS) ×2 IMPLANT
CATH INFINITI 5 FR JL3.5 (CATHETERS) ×3 IMPLANT
CATH INFINITI 5FR AL1 (CATHETERS) ×2 IMPLANT
CATH INFINITI 5FR ANG PIGTAIL (CATHETERS) ×3 IMPLANT
CATH INFINITI JR4 5F (CATHETERS) ×3 IMPLANT
CATH SWAN GANZ 7F STRAIGHT (CATHETERS) ×2 IMPLANT
DEVICE RAD COMP TR BAND LRG (VASCULAR PRODUCTS) ×3 IMPLANT
GLIDESHEATH SLEND SS 6F .021 (SHEATH) ×3 IMPLANT
KIT HEART LEFT (KITS) ×3 IMPLANT
KIT HEART RIGHT NAMIC (KITS) ×3 IMPLANT
PACK CARDIAC CATHETERIZATION (CUSTOM PROCEDURE TRAY) ×3 IMPLANT
SHEATH FAST CATH BRACH 5F 5CM (SHEATH) ×2 IMPLANT
SHEATH PINNACLE 7F 10CM (SHEATH) ×2 IMPLANT
SYR MEDRAD MARK V 150ML (SYRINGE) ×3 IMPLANT
TRANSDUCER W/STOPCOCK (MISCELLANEOUS) ×4 IMPLANT
TUBING CIL FLEX 10 FLL-RA (TUBING) ×3 IMPLANT
WIRE EMERALD 3MM-J .025X260CM (WIRE) ×2 IMPLANT
WIRE EMERALD ST .035X260CM (WIRE) ×2 IMPLANT
WIRE SAFE-T 1.5MM-J .035X260CM (WIRE) ×3 IMPLANT

## 2015-08-01 NOTE — Progress Notes (Signed)
Site area: right femoral vein Site Prior to Removal:  Level 0 Pressure Applied For: 15 minutes Manual:   Yes/ debbie rn  Patient Status During Pull:  stable Post Pull Site:  Level 0 Post Pull Instructions Given: yes, but pt sleepy  Post Pull Pulses Present: dp 2+ Dressing Applied:  yes Bedrest begins @ 1140 Comments: resting comfortably

## 2015-08-01 NOTE — Progress Notes (Signed)
Right anticubital area has hematoma at brachial insertion site.  Pressure applied and cath lab notified.  Debbie, RN from cath lab present to hold pressure.

## 2015-08-01 NOTE — Telephone Encounter (Signed)
w/DrBlyth

## 2015-08-01 NOTE — Telephone Encounter (Signed)
Pt's wife dropped off forms needed for [facility]. Completed as much as possible and forwarded to Dr. Charlett Blake. JG//CMA

## 2015-08-01 NOTE — H&P (View-Only) (Signed)
Multi-Disciplinary Valve [facility] Note   History of Present Illness: 81 yo male with history of atrial fibrillation, TIA, hyperlipidemia, AAA and aortic stenosis who is referred today for further discussion of treatment options for his aortic stenosis. He was noted to be in atrial fibrillation during an office visit in primary care in August 2016. He was also noted to have a murmur at that time. Echo 06/06/15 with severe LVH, normal LV systolic function, severe aortic valve stenosis (mean gradient 38 mm Hg, AVA 0.66cm2). He was seen in our office 07/03/15 by Dr. Oval Linsey. The plan has been to continue Plavix with history of TIA. He has not been anti-coagulated due to fall risk and bleeding risk. His wife reports arrhythmia for 35 years. He has not been on coumadin in the past. He has had progressive symptoms over the last few months with increasing dyspnea with exertion and fatigue. He is limited by weakness in his legs but even walking across the room with his walker causes profound dyspnea. He has baseline lower extremity edema for many years. This has not changed over the last year. His edema improves at night. No chest pain, near syncope or syncope. 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 He lives at home with his wife. He uses a walker at home to ambulate. He has had a previous stroke with right sided weakness following the stroke. He has at least mild memory impairment and is hard of hearing.   Primary Care Physician: Charlett Blake  Past Medical History  Diagnosis Date  . Arthritis   . SOB (shortness of breath) on exertion   . Hyperlipidemia   . Stroke     TIA history 2005  . Hyperglycemia 01/04/2012  . Leg pain, right 03/01/2012  . Right leg weakness 03/01/2012  . Skin lesion of right arm 09/14/2012  . Abrasion of hand 09/14/2012  . Cerumen impaction 09/14/2012  . Unsteady gait 12/26/2012  . Insomnia 12/26/2012  . Bilateral groin pain 02/13/2013    L>R  . Peripheral edema 03/29/2013  . Preventative health care  06/26/2013  . AAA (abdominal aortic aneurysm)   . Atrial fibrillation     Past Surgical History  Procedure Laterality Date  . Thumb tendon severed and repaired      right, tendon snapped  . Carpal tunnel release      Right  . Achilles tendon repair      left, repaired w/ wire  . Brain surgery      done by Dr Lizabeth Leyden for tinnitius, unsuccessful, only on right  . Inguinal herniorrhapy  years ago    right and then 2 on left  . Lumbar laminectomy      L3-L5 posterior laminectomy 2006  . Joint replacement      Bilateral total knee replacement  . Spine surgery      Current Outpatient Prescriptions  Medication Sig Dispense Refill  . clopidogrel (PLAVIX) 75 MG tablet TAKE 1 TABLET (75 MG TOTAL) BY MOUTH AT BEDTIME. 90 tablet 1  . fish oil-omega-3 fatty acids 1000 MG capsule Take 500 mg by mouth daily. 2 daily     . LORazepam (ATIVAN) 0.5 MG tablet Take 1 tablet (0.5 mg total) by mouth at bedtime as needed for anxiety. 90 tablet 0  . simvastatin (ZOCOR) 40 MG tablet Take 1 tablet (40 mg total) by mouth daily at 6 PM. 90 tablet 1  . predniSONE (DELTASONE) 20 MG tablet Take 3 tablets at 6 PM and 10 PM evening before  procedure and at 8 AM day of procedure 9 tablet 0   No current facility-administered medications for this visit.    Allergies  Allergen Reactions  . Niacin And Related Rash    Facial redness and Rash head to toe  . Omnipaque [Iohexol] Rash    Facial redness and flushing    Social History   Social History  . Marital Status: Married    Spouse Name: N/A  . Number of Children: N/A  . Years of Education: N/A   Occupational History  . Retired    Social History Main Topics  . Smoking status: Former Smoker -- 3.00 packs/day    Quit date: 10/16/1960  . Smokeless tobacco: Never Used  . Alcohol Use: 8.4 oz/week    14 Cans of beer per week     Comment: 1-2 beer daily  . Drug Use: No  . Sexual Activity: Not on file   Other Topics Concern  . Not on file   Social  History Narrative    Family History  Problem Relation Age of Onset  . Other Mother     CHF  . Ulcers Mother     Venous stasis  . Other Father     CHF  . Cancer Sister     unknown  . Hip fracture Maternal Grandmother   . Pneumonia Maternal Grandmother     Review of Systems:  As stated in the HPI and otherwise negative.   BP 102/50 mmHg  Pulse 86  Ht 6\' 4"  (1.93 m)  Wt 190 lb 12.8 oz (86.546 kg)  BMI 23.23 kg/m2  SpO2 98%  Physical Examination: General: Well developed, well nourished, NAD HEENT: OP clear, mucus membranes moist SKIN: warm, dry. No rashes. Neuro: No focal deficits Musculoskeletal: Muscle strength 5/5 all ext Psychiatric: Mood and affect normal Neck: No JVD, no carotid bruits, no thyromegaly, no lymphadenopathy. Lungs:Clear bilaterally, no wheezes, rhonci, crackles Cardiovascular: Irregular irregular with loud harsh systolic murmru. No gallops or rubs. Abdomen:Soft. Bowel sounds present. Non-tender.  Extremities: 1+ bilateral lower extremity edema. Pulses are 2 + in the bilateral DP/PT.  Echo 06/06/15: Left ventricle: The cavity size was normal. Wall thickness was increased in a pattern of severe LVH. Systolic function was normal. The estimated ejection fraction was in the range of 50% to 55%. Wall motion was normal; there were no regional wall motion abnormalities. - Aortic valve: Cusp separation was moderately reduced. There was severe stenosis. Peak velocity (S): 404 cm/s. Mean gradient (S): 38 mm Hg. Valve area (VTI): 0.66 cm^2. Valve area (Vmax): 0.61 cm^2. Valve area (Vmean): 0.62 cm^2. - Mitral valve: Calcified annulus. Mildly thickened leaflets . - Left atrium: The atrium was mildly dilated. - Right ventricle: The cavity size was mildly dilated. Wall thickness was normal. - Right atrium: The atrium was mildly dilated. - Pulmonary arteries: Systolic pressure was mildly to moderately increased. PA peak pressure: 44 mm Hg  (S). - Pericardium, extracardiac: A small pericardial effusion was identified along the right atrial free wall. There was no evidence of hemodynamic compromise.  Impressions:  - Aortic stenosis has progressed when compared to prior echocardiogram.  EKG:  EKG is not ordered today. The ekg ordered today demonstrates   Recent Labs: 06/04/2015: ALT 18; BUN 20; Creatinine, Ser 1.05; Hemoglobin 13.5; Platelets 185.0; Potassium 4.2; Sodium 135; TSH 1.90   Lipid Panel    Component Value Date/Time   CHOL 115 01/29/2015 1609   TRIG 72.0 01/29/2015 1609   HDL 44.10 01/29/2015 1609  CHOLHDL 3 01/29/2015 1609   VLDL 14.4 01/29/2015 1609   LDLCALC 57 01/29/2015 1609     Wt Readings from Last 3 Encounters:  07/11/15 190 lb 12.8 oz (86.546 kg)  07/03/15 195 lb 8 oz (88.678 kg)  06/04/15 202 lb (91.627 kg)     Other studies Reviewed: Additional studies/ records that were reviewed today include: . Review of the above records demonstrates:   STS Risk Calculator:  Risk of Mortality: 2.079%  Morbidity or Mortality: 16.339%  Long Length of Stay: 8.078%  Short Length of Stay: 26.565%  Permanent Stroke: 2.661%  Prolonged Ventilation: 8.556%  DSW Infection: 0.292%  Renal Failure: 4.113%  Reoperation: 8.435%     Assessment and Plan:   1. Severe aortic valve stenosis: He has stage D symptomatic aortic stenosis. I have personally reviewed his echo images. He has thickened aortic valve leaflets with poor leaflet excursion. Mean gradient is 38 mm Hg. I think that he would benefit from AVR but he is not a good candidate for conventional surgical AVR given advanced age and relative immobility. He appears to be a good candidate for TAVR. He is willing to proceed with workup for TAVR. I have reviewed the TAVR procedure with the patient, his wife and his daughter including risks associated with the procedure. It is reasonable to think he may receive some relief of his dyspnea and fatigue if we  proceed with TAVR. Will begin by arranging right and left heart cath on 08/01/15 at [facility]. Risks and benefits of procedure reviewed. Pre-cath labs week of cath. Pre-treatment for contrast allergy. I will ask Dr. Cyndia Bent or Dr. Roxy Manns to see him after his cath. Will arrange CT scans and PFTs following cath.   Current medicines are reviewed at length with the patient today.  The patient does not have concerns regarding medicines.  The following changes have been made:  no change  Labs/ tests ordered today include: Cardiac cath/pre-cath labs  Orders Placed This Encounter  Procedures  . CBC w/Diff  . INR/PT  . Basic Metabolic Panel (BMET)    Disposition:   FU with CT surgery after cath for further planning of TAVR   Signed, Lauree Chandler, MD 07/11/2015 1:23 PM    [facility] [address], [place], [place]  [address] Phone: [phone]; Fax: [phone]

## 2015-08-01 NOTE — Progress Notes (Signed)
Dr. Angelena Form notified of hematoma to right brachial.   Continuing to hold pressure with decrease in size noted.

## 2015-08-01 NOTE — Interval H&P Note (Signed)
History and Physical Interval Note:  08/01/2015 8:30 AM  Shawn Cortez  has presented today for cardiac cath with the diagnosis of aortic stenosis.  The various methods of treatment have been discussed with the patient and family. After consideration of risks, benefits and other options for treatment, the patient has consented to  Procedure(s): Right/Left Heart Cath and Coronary Angiography (N/A) as a surgical intervention .  The patient's history has been reviewed, patient examined, no change in status, stable for surgery.  I have reviewed the patient's chart and labs.  Questions were answered to the patient's satisfaction.    Cath Lab Visit (complete for each Cath Lab visit)  Clinical Evaluation Leading to the Procedure:   ACS: No.  Non-ACS:    Anginal Classification: CCS II  Anti-ischemic medical therapy: No Therapy  Non-Invasive Test Results: No non-invasive testing performed  Prior CABG: No previous CABG         Royanne Warshaw

## 2015-08-01 NOTE — Discharge Instructions (Signed)
Angiogram, Care After °Refer to this sheet in the next few weeks. These instructions provide you with information about caring for yourself after your procedure. Your health care provider may also give you more specific instructions. Your treatment has been planned according to current medical practices, but problems sometimes occur. Call your health care provider if you have any problems or questions after your procedure. °WHAT TO EXPECT AFTER THE PROCEDURE °After your procedure, it is typical to have the following: °· Bruising at the catheter insertion site that usually fades within 1-2 weeks. °· Blood collecting in the tissue (hematoma) that may be painful to the touch. It should usually decrease in size and tenderness within 1-2 weeks. °HOME CARE INSTRUCTIONS °· Take medicines only as directed by your health care provider. °· You may shower 24-48 hours after the procedure or as directed by your health care provider. Remove the bandage (dressing) and gently wash the site with plain soap and water. Pat the area dry with a clean towel. Do not rub the site, because this may cause bleeding. °· Do not take baths, swim, or use a hot tub until your health care provider approves. °· Check your insertion site every day for redness, swelling, or drainage. °· Do not apply powder or lotion to the site. °· Do not lift over 10 lb (4.5 kg) for 5 days after your procedure or as directed by your health care provider. °· Ask your health care provider when it is okay to: °¨ Return to work or school. °¨ Resume usual physical activities or sports. °¨ Resume sexual activity. °· Do not drive home if you are discharged the same day as the procedure. Have someone else drive you. °· You may drive 24 hours after the procedure unless otherwise instructed by your health care provider. °· Do not operate machinery or power tools for 24 hours after the procedure or as directed by your health care provider. °· If your procedure was done as an  outpatient procedure, which means that you went home the same day as your procedure, a responsible adult should be with you for the first 24 hours after you arrive home. °· Keep all follow-up visits as directed by your health care provider. This is important. °SEEK MEDICAL CARE IF: °· You have a fever. °· You have chills. °· You have increased bleeding from the catheter insertion site. Hold pressure on the site. °SEEK IMMEDIATE MEDICAL CARE IF: °· You have unusual pain at the catheter insertion site. °· You have redness, warmth, or swelling at the catheter insertion site. °· You have drainage (other than a small amount of blood on the dressing) from the catheter insertion site. °· The catheter insertion site is bleeding, and the bleeding does not stop after 30 minutes of holding steady pressure on the site. °· The area near or just beyond the catheter insertion site becomes pale, cool, tingly, or numb. °  °This information is not intended to replace advice given to you by your health care provider. Make sure you discuss any questions you have with your health care provider. °  °Document Released: 05/01/2005 Document Revised: 11/03/2014 Document Reviewed: 03/16/2013 °Elsevier Interactive Patient Education ©2016 [employer] °Radial Site Care °Refer to this sheet in the next few weeks. These instructions provide you with information about caring for yourself after your procedure. Your health care provider may also give you more specific instructions. Your treatment has been planned according to current medical practices, but problems sometimes occur. Call your   health care provider if you have any problems or questions after your procedure. °WHAT TO EXPECT AFTER THE PROCEDURE °After your procedure, it is typical to have the following: °· Bruising at the radial site that usually fades within 1-2 weeks. °· Blood collecting in the tissue (hematoma) that may be painful to the touch. It should usually decrease in size and  tenderness within 1-2 weeks. °HOME CARE INSTRUCTIONS °· Take medicines only as directed by your health care provider. °· You may shower 24-48 hours after the procedure or as directed by your health care provider. Remove the bandage (dressing) and gently wash the site with plain soap and water. Pat the area dry with a clean towel. Do not rub the site, because this may cause bleeding. °· Do not take baths, swim, or use a hot tub until your health care provider approves. °· Check your insertion site every day for redness, swelling, or drainage. °· Do not apply powder or lotion to the site. °· Do not flex or bend the affected arm for 24 hours or as directed by your health care provider. °· Do not push or pull heavy objects with the affected arm for 24 hours or as directed by your health care provider. °· Do not lift over 10 lb (4.5 kg) for 5 days after your procedure or as directed by your health care provider. °· Ask your health care provider when it is okay to: °¨ Return to work or school. °¨ Resume usual physical activities or sports. °¨ Resume sexual activity. °· Do not drive home if you are discharged the same day as the procedure. Have someone else drive you. °· You may drive 24 hours after the procedure unless otherwise instructed by your health care provider. °· Do not operate machinery or power tools for 24 hours after the procedure. °· If your procedure was done as an outpatient procedure, which means that you went home the same day as your procedure, a responsible adult should be with you for the first 24 hours after you arrive home. °· Keep all follow-up visits as directed by your health care provider. This is important. °SEEK MEDICAL CARE IF: °· You have a fever. °· You have chills. °· You have increased bleeding from the radial site. Hold pressure on the site. °SEEK IMMEDIATE MEDICAL CARE IF: °· You have unusual pain at the radial site. °· You have redness, warmth, or swelling at the radial site. °· You  have drainage (other than a small amount of blood on the dressing) from the radial site. °· The radial site is bleeding, and the bleeding does not stop after 30 minutes of holding steady pressure on the site. °· Your arm or hand becomes pale, cool, tingly, or numb. °  °This information is not intended to replace advice given to you by your health care provider. Make sure you discuss any questions you have with your health care provider. °  °Document Released: 11/15/2010 Document Revised: 11/03/2014 Document Reviewed: 05/01/2014 °Elsevier Interactive Patient Education ©2016 [employer] ° °

## 2015-08-02 ENCOUNTER — Ambulatory Visit: Payer: Medicare HMO | Admitting: Family Medicine

## 2015-08-02 ENCOUNTER — Encounter (HOSPITAL_COMMUNITY): Payer: Self-pay | Admitting: Cardiovascular Disease

## 2015-08-06 ENCOUNTER — Encounter: Payer: Medicare HMO | Admitting: Thoracic Surgery (Cardiothoracic Vascular Surgery)

## 2015-08-06 ENCOUNTER — Institutional Professional Consult (permissible substitution) (INDEPENDENT_AMBULATORY_CARE_PROVIDER_SITE_OTHER): Payer: Medicare HMO | Admitting: Thoracic Surgery (Cardiothoracic Vascular Surgery)

## 2015-08-06 ENCOUNTER — Encounter: Payer: Self-pay | Admitting: Thoracic Surgery (Cardiothoracic Vascular Surgery)

## 2015-08-06 ENCOUNTER — Telehealth: Payer: Self-pay | Admitting: Family Medicine

## 2015-08-06 ENCOUNTER — Telehealth: Payer: Self-pay

## 2015-08-06 ENCOUNTER — Other Ambulatory Visit: Payer: Self-pay | Admitting: *Deleted

## 2015-08-06 VITALS — BP 93/62 | HR 62 | Resp 20 | Ht 76.0 in | Wt 190.0 lb

## 2015-08-06 DIAGNOSIS — I5032 Chronic diastolic (congestive) heart failure: Secondary | ICD-10-CM | POA: Diagnosis not present

## 2015-08-06 DIAGNOSIS — I4891 Unspecified atrial fibrillation: Secondary | ICD-10-CM

## 2015-08-06 DIAGNOSIS — I2583 Coronary atherosclerosis due to lipid rich plaque: Secondary | ICD-10-CM

## 2015-08-06 DIAGNOSIS — I2510 Atherosclerotic heart disease of native coronary artery without angina pectoris: Secondary | ICD-10-CM

## 2015-08-06 DIAGNOSIS — I350 Nonrheumatic aortic (valve) stenosis: Secondary | ICD-10-CM | POA: Diagnosis not present

## 2015-08-06 NOTE — Progress Notes (Signed)
[facility]    CARDIOTHORACIC SURGERY CONSULTATION REPORT  Referring Provider is Skeet Latch, MD PCP is Penni Homans, MD  Chief Complaint  Patient presents with  . Aortic Stenosis    Eval for TAVR, ECHO 06/06/15, Cardiac Cath 08/01/15    HPI:  Patient is an 81 year old male with history of aortic stenosis, recent onset paroxysmal atrial fibrillation, chronic diastolic congestive heart failure, peripheral neuropathy, previous stroke and TIAs, AAA, and severe physical deconditioning with generalized weakness and very unsteady gait who has been referred for surgical consultation to discuss treatment options for management of severe symptomatic aortic stenosis.  The patient was reportedly discovered to have a heart murmur on physical exam several years ago and previous echocardiograms have documented the presence of mild aortic stenosis.  He has been followed regular by his primary care physician noted that the patient was in atrial fibrillation at the time of routine follow-up appointment on 06/17/2015. Follow-up transthoracic echocardiogram was performed demonstrating the presence of severe aortic stenosis with peak velocity across the aortic valve measured 4 m/s corresponding to mean transvalvular gradient estimated 38 mmHg. Left ventricular systolic function remained preserved with ejection fraction estimated 50-55%. The patient was referred for cardiology consultation and initially evaluated by Dr. Oval Linsey on 07/03/2015. The patient was noted to be anticoagulated chronically using aspirin and Plavix because of previous history of stroke and TIA. The patient is felt to be at high risk for falls because of his severe generalized weakness, unsteady gait, and limited physical mobility. The patient subsequently underwent left and right heart catheterization by Dr. Angelena Form confirming the presence of severe aortic stenosis with mean  transvalvular gradient measured 28.4 mmHg corresponding to aortic valve area calculated 0.92 cm. Coronary angiography demonstrated the presence of significant single-vessel coronary artery disease with long segment 65% tubular stenosis of the mid left anterior descending coronary artery and otherwise only mild nonobstructive disease. Pulmonary artery pressures were mildly elevated. The patient was referred for surgical consultation.  The patient is married and lives with his wife locally in [place]. They're in the process of getting ready to move to the [facility] community.  The patient has a long-standing history of peripheral neuropathy that causes chronic pain and numbness in both hands and both feet. The patient has had severely limited physical mobility dating back more than 10 years ago when he first began to use a cane for ambulation because of unsteady gait. The patient's mobility deteriorated progressively over the past 10 years, and for the past 2 years the patient has remained very immobile.  He can only walk very short distances using a [place]ater assisted walker.  He cannot get out of bed without using both arms to lift himself out of bed. He needs assistance with dressing and cleaning. He is able to get himself to the bathroom and back by himself. According to the patient's wife and daughter he gets short of breath and tired with any activity, although the patient denies any symptoms of shortness of breath. He has never had any resting shortness of breath, PND, orthopnea, dizzy spells, or syncope. He has chronic bilateral lower extremity edema. He has never had any chest pain or chest tightness. He has had increasing problems with short-term memory loss. The patient's wife and daughter feel that the patient's problems with confusion and short-term memory loss have gotten considerably worse over the past few months, first becoming noticeable after he underwent dental extraction.  He  spends his days reading and watching television was otherwise very limited activity.  Past Medical History  Diagnosis Date  . Arthritis   . SOB (shortness of breath) on exertion   . Hyperlipidemia   . Stroke [facility])     TIA history 2005  . Hyperglycemia 01/04/2012  . Leg pain, right 03/01/2012  . Right leg weakness 03/01/2012  . Skin lesion of right arm 09/14/2012  . Abrasion of hand 09/14/2012  . Cerumen impaction 09/14/2012  . Unsteady gait 12/26/2012  . Insomnia 12/26/2012  . Bilateral groin pain 02/13/2013    L>R  . Peripheral edema 03/29/2013  . Preventative health care 06/26/2013  . AAA (abdominal aortic aneurysm) ([place])   . Atrial fibrillation ([place])   . Aortic stenosis, severe 07/02/2015  . Depression 06/17/2015  . New onset a-fib ([place]) 06/17/2015  . Chronic diastolic congestive heart failure ([place])   . Hereditary and idiopathic peripheral neuropathy 08/12/2010  . SPINAL STENOSIS, LUMBAR 08/12/2010  . UNSTEADY GAIT 08/12/2010  . TIA 08/12/2010  . Coronary artery disease 08/01/2015    65% stenosis mid LAD    Past Surgical History  Procedure Laterality Date  . Thumb tendon severed and repaired      right, tendon snapped  . Carpal tunnel release      Right  . Achilles tendon repair      left, repaired w/ wire  . Brain surgery      done by Dr Lizabeth Leyden for tinnitius, unsuccessful, only on right  . Inguinal herniorrhapy  years ago    right and then 2 on left  . Lumbar laminectomy      L3-L5 posterior laminectomy 2006  . Joint replacement      Bilateral total knee replacement  . Spine surgery    . Cardiac catheterization N/A 08/01/2015    Procedure: Right/Left Heart Cath and Coronary Angiography;  Surgeon: Burnell Blanks, MD;  Location: [facility];  Service: Cardiovascular;  Laterality: N/A;    Family History  Problem Relation Age of Onset  . Other Mother     CHF  . Ulcers Mother     Venous stasis  . Other Father     CHF  . Cancer Sister     unknown  . Hip  fracture Maternal Grandmother   . Pneumonia Maternal Grandmother     Social History   Social History  . Marital Status: Married    Spouse Name: N/A  . Number of Children: N/A  . Years of Education: N/A   Occupational History  . Retired    Social History Main Topics  . Smoking status: Former Smoker -- 3.00 packs/day    Quit date: 10/16/1960  . Smokeless tobacco: Never Used  . Alcohol Use: 8.4 oz/week    14 Cans of beer per week     Comment: 1-2 beer daily  . Drug Use: No  . Sexual Activity: Not on file   Other Topics Concern  . Not on file   Social History Narrative    Current Outpatient Prescriptions  Medication Sig Dispense Refill  . bisacodyl (DULCOLAX) 5 MG EC tablet Take 5 mg by mouth 2 (two) times daily.    . clopidogrel (PLAVIX) 75 MG tablet TAKE 1 TABLET BY MOUTH ATBEDTIME. 90 tablet 1  . fish oil-omega-3 fatty acids 1000 MG capsule Take 2 g by mouth at bedtime.     Marland Kitchen ibuprofen (ADVIL,MOTRIN) 200 MG tablet Take 200 mg by mouth daily as needed (pain).    Marland Kitchen  LORazepam (ATIVAN) 0.5 MG tablet Take 1 tablet (0.5 mg total) by mouth at bedtime as needed for anxiety. (Patient taking differently: Take 0.5 mg by mouth at bedtime as needed for sleep. ) 90 tablet 0  . Polyvinyl Alcohol-Povidone (REFRESH OP) Place 1 drop into both eyes daily as needed (itching/watery eyes).    . simvastatin (ZOCOR) 40 MG tablet TAKE 1 TABLET BY MOUTH DAILY AT 6 PM. (Patient taking differently: TAKE 1 TABLET BY MOUTH DAILY AT BEDTIME) 90 tablet 1   No current facility-administered medications for this visit.    Allergies  Allergen Reactions  . Niacin And Related Rash    Facial redness and Rash head to toe  . Omnipaque [Iohexol] Rash    Facial redness and flushing      Review of Systems:   General:  decreased appetite, decreased energy, no weight gain, + 10 pound weight loss, no fever  Cardiac:  no chest pain with exertion, no chest pain at rest, + SOB with exertion, no resting SOB, no  PND, no orthopnea, no palpitations, + arrhythmia, + atrial fibrillation, + LE edema, no dizzy spells, no syncope  Respiratory:  + shortness of breath, nop home oxygen, no productive cough, no dry cough, no bronchitis, no wheezing, no hemoptysis, no asthma, no pain with inspiration or cough, no sleep apnea, no CPAP at night  GI:   no difficulty swallowing, no reflux, no frequent heartburn, no hiatal hernia, no abdominal pain, + constipation, no diarrhea, no hematochezia, no hematemesis, no melena  GU:   no dysuria,  no frequency, no urinary tract infection, no hematuria, no enlarged prostate, no kidney stones, no kidney disease  Vascular:  no pain suggestive of claudication, + pain in feet, no leg cramps, no varicose veins, no DVT, no non-healing foot ulcer  Neuro:   + stroke, + TIA's, no seizures, no headaches, no temporary blindness one eye,  no slurred speech, + peripheral neuropathy, + chronic pain, + severe instability of gait, + significant memory/cognitive dysfunction  Musculoskeletal: + arthritis, no joint swelling, no myalgias, + difficulty walking, extremely limited mobility   Skin:   no rash, no itching, no skin infections, no pressure sores or ulcerations  Psych:   no anxiety, no depression, no nervousness, no unusual recent stress  Eyes:   no blurry vision, no floaters, no recent vision changes, + wears glasses or contacts  ENT:   + hearing loss, no loose or painful teeth, + dentures, last saw dentist September 2016  Hematologic:  + easy bruising, + abnormal bleeding, no clotting disorder, + frequent epistaxis  Endocrine:  no diabetes, does not check CBG's at home           Physical Exam:   BP 93/62 mmHg  Pulse 62  Resp 20  Ht 6\' 4"  (1.93 m)  Wt 190 lb (86.183 kg)  BMI 23.14 kg/m2  SpO2 99%  General:  Elderly, weak and frail-appearing  HEENT:  Unremarkable   Neck:   no JVD, no bruits, no adenopathy   Chest:   clear to auscultation, symmetrical breath sounds, no wheezes, no  rhonchi   CV:   Irregular rate and rhythma, grade IV/VI crescendo/decrescendo murmur heard best at LSB,  no diastolic murmur  Abdomen:  soft, non-tender, no masses   Extremities:  warm, well-perfused, pulses diminished, 2+ bilateral LE edema  Rectal/GU  Deferred  Neuro:   Grossly non-focal and symmetrical throughout  Skin:   Clean and dry, no rashes, no breakdown  Diagnostic Tests:  Transthoracic Echocardiography  Patient:  Shawn Cortez, [place] MR #:    [id number] Study Date: 06/06/2015 Gender:   M Age:    46 Height:   190.5 cm Weight:   91.6 kg BSA:    2.21 m^2 Pt. Status: Room:  ATTENDING  Serita Butcher A REFERRING  [place] [place], Outpatient  cc:  ------------------------------------------------------------------- LV EF: 50% -  55%  ------------------------------------------------------------------- Indications:   Aortic stenosis 424.1.  ------------------------------------------------------------------- History:  PMH:  Murmur. Transient ischemic attack. Risk factors: Dyslipidemia.  ------------------------------------------------------------------- Study Conclusions  - Left ventricle: The cavity size was normal. Wall thickness was increased in a pattern of severe LVH. Systolic function was normal. The estimated ejection fraction was in the range of 50% to 55%. Wall motion was normal; there were no regional wall motion abnormalities. - Aortic valve: Cusp separation was moderately reduced. There was severe stenosis. Peak velocity (S): 404 cm/s. Mean gradient (S): 38 mm Hg. Valve area (VTI): 0.66 cm^2. Valve area (Vmax): 0.61 cm^2. Valve area (Vmean): 0.62 cm^2. - Mitral valve: Calcified annulus. Mildly thickened leaflets . - Left atrium: The atrium was mildly dilated. - Right ventricle: The cavity size was mildly dilated.  Wall thickness was normal. - Right atrium: The atrium was mildly dilated. - Pulmonary arteries: Systolic pressure was mildly to moderately increased. PA peak pressure: 44 mm Hg (S). - Pericardium, extracardiac: A small pericardial effusion was identified along the right atrial free wall. There was no evidence of hemodynamic compromise.  Impressions:  - Aortic stenosis has progressed when compared to prior echocardiogram.  Transthoracic echocardiography. M-mode, complete 2D, spectral Doppler, and color Doppler. Birthdate: Patient birthdate: 1924-12-27. Age: Patient is 81 yr old. Sex: Gender: male. BMI: 25.2 kg/m^2. Blood pressure:   103/66 Patient status: Outpatient. Study date: Study date: 06/06/2015. Study time: 10:14 AM. Location: Echo laboratory.  -------------------------------------------------------------------  ------------------------------------------------------------------- Left ventricle: The cavity size was normal. Wall thickness was increased in a pattern of severe LVH. Systolic function was normal. The estimated ejection fraction was in the range of 50% to 55%. Wall motion was normal; there were no regional wall motion abnormalities.  ------------------------------------------------------------------- Aortic valve:  Trileaflet; mildly thickened leaflets. Cusp separation was moderately reduced. Doppler:  There was severe stenosis.  There was no regurgitation.  VTI ratio of LVOT to aortic valve: 0.15. Valve area (VTI): 0.66 cm^2. Indexed valve area (VTI): 0.3 cm^2/m^2. Peak velocity ratio of LVOT to aortic valve: 0.13. Valve area (Vmax): 0.61 cm^2. Indexed valve area (Vmax): 0.27 cm^2/m^2. Mean velocity ratio of LVOT to aortic valve: 0.14. Valve area (Vmean): 0.62 cm^2. Indexed valve area (Vmean): 0.28 cm^2/m^2.  Mean gradient (S): 38 mm Hg. Peak gradient (S): 65 mm  Hg.  ------------------------------------------------------------------- Aorta: Aortic root: The aortic root was normal in size.  ------------------------------------------------------------------- Mitral valve:  Calcified annulus. Mildly thickened leaflets . Mobility was not restricted. Doppler: Transvalvular velocity was within the normal range. There was no evidence for stenosis. There was trivial regurgitation.  Peak gradient (D): 4 mm Hg.  ------------------------------------------------------------------- Left atrium: The atrium was mildly dilated.  ------------------------------------------------------------------- Right ventricle: The cavity size was mildly dilated. Wall thickness was normal. Systolic function was normal.  ------------------------------------------------------------------- Pulmonic valve:  Poorly visualized. Structurally normal valve. Cusp separation was normal. Doppler: Transvalvular velocity was within the normal range. There was no evidence for stenosis. There was no regurgitation.  ------------------------------------------------------------------- Tricuspid valve:  Structurally normal valve.  Doppler: Transvalvular velocity was  within the normal range. There was mild regurgitation.  ------------------------------------------------------------------- Pulmonary artery:  The main pulmonary artery was normal-sized. Systolic pressure was mildly to moderately increased.  ------------------------------------------------------------------- Right atrium: The atrium was mildly dilated.  ------------------------------------------------------------------- Pericardium: A small pericardial effusion was identified along the right atrial free wall. There was no evidence of hemodynamic compromise.  ------------------------------------------------------------------- Systemic veins: Inferior vena cava: The vessel was dilated. The  respirophasic diameter changes were blunted (< 50%), consistent with elevated central venous pressure.  ------------------------------------------------------------------- Measurements  Left ventricle              Value     Reference LV ID, ED, PLAX chordal      (L)   39.2 mm    43 - 52 LV ID, ES, PLAX chordal          30  mm    23 - 38 LV fx shortening, PLAX chordal  (L)   23  %    >=29 LV PW thickness, ED            19.3 mm    --------- IVS/LV PW ratio, ED            0.82      <=1.3 Stroke volume, 2D             56  ml    --------- Stroke volume/bsa, 2D           25  ml/m^2  ---------  Ventricular septum            Value     Reference IVS thickness, ED             15.9 mm    ---------  LVOT                   Value     Reference LVOT ID, S                24  mm    --------- LVOT area                 4.52 cm^2   --------- LVOT peak velocity, S           54.1 cm/s   --------- LVOT mean velocity, S           38.5 cm/s   --------- LVOT VTI, S                12.4 cm    ---------  Aortic valve               Value     Reference Aortic valve peak velocity, S       404  cm/s   --------- Aortic valve mean velocity, S       279  cm/s   --------- Aortic valve VTI, S            85.5 cm    --------- Aortic mean gradient, S          38  mm Hg  --------- Aortic peak gradient, S          65  mm Hg  --------- VTI ratio, LVOT/AV            0.15      --------- Aortic valve area, VTI          0.66 cm^2   --------- Aortic valve area/bsa, VTI        0.3  cm^2/m^2 --------- Velocity ratio,  peak, LVOT/AV        0.13      --------- Aortic valve area, peak velocity     0.61 cm^2   --------- Aortic valve area/bsa, peak        0.27 cm^2/m^2 --------- velocity Velocity ratio, mean, LVOT/AV       0.14      --------- Aortic valve area, mean velocity     0.62 cm^2   --------- Aortic valve area/bsa, mean        0.28 cm^2/m^2 --------- velocity  Aorta                   Value     Reference Aortic root ID, ED            32  mm    ---------  Left atrium                Value     Reference LA ID, A-P, ES              46  mm    --------- LA ID/bsa, A-P              2.08 cm/m^2  <=2.2 LA volume, S               95  ml    --------- LA volume/bsa, S             43  ml/m^2  --------- LA volume, ES, 1-p A4C          61  ml    --------- LA volume/bsa, ES, 1-p A4C        27.6 ml/m^2  --------- LA volume, ES, 1-p A2C          133  ml    --------- LA volume/bsa, ES, 1-p A2C        60.2 ml/m^2  ---------  Mitral valve               Value     Reference Mitral E-wave peak velocity        96.3 cm/s   --------- Mitral A-wave peak velocity        35  cm/s   --------- Mitral deceleration time         225  ms    150 - 230 Mitral peak gradient, D          4   mm Hg  --------- Mitral E/A ratio, peak          2.8      --------- Mitral regurg VTI, PISA          163  cm    ---------  Pulmonary arteries            Value     Reference PA pressure, S, DP        (H)   44  mm Hg  <=30  Tricuspid valve              Value     Reference Tricuspid regurg peak velocity      270  cm/s   --------- Tricuspid peak RV-RA  gradient       29  mm Hg  ---------  Systemic veins              Value     Reference Estimated CVP               15  mm Hg  ---------  Right ventricle              Value     Reference RV pressure, S, DP        (H)   44  mm Hg  <=30  Legend: (L) and (H) mark values outside specified reference range.  ------------------------------------------------------------------- Prepared and Electronically Authenticated by  Candee Furbish, M.D. 2016-08-10T11:13:49    CARDIAC CATHETERIZATION Procedures    Right/Left Heart Cath and Coronary Angiography    Conclusion     Prox RCA lesion, 20% stenosed.  Mid RCA lesion, 20% stenosed.  Dist RCA lesion, 20% stenosed.  Post Atrio lesion, 20% stenosed.  Mid LAD to Dist LAD lesion, 65% stenosed.  1. Moderate, calcific stenosis in the mid LAD that does not appear to be flow limiting.  2. Mild non-obstructive disease in the RCA.  3. Severe aortic valve stenosis (peak to peak gradient 31 mmHg, mean gradient 28.4 mmHg, AVA 0.92). The valve is calcified and was very difficult to cross suggesting severe stenosis.   Recommendations: His LAD stenosis does not appear to be flow limiting and he is not having angina. I think we can manage the LAD stenosis medically. Will continue workup for TAVR. He has an appt to see Dr. Roxy Manns with CT surgery on 08/06/15. Will plan CT scans pending stability of renal function post cath.      Indications    Severe aortic valve stenosis [I35.0 (ICD-10-CM)]    Technique and Indications    Estimated blood loss <50 mL. Indication: Severe aortic valve stenosis  Procedure: The risks, benefits, complications, treatment options, and expected outcomes were discussed with the patient. The patient and/or family concurred with the proposed plan, giving informed consent. The patient was brought to the cath lab after IV hydration was  begun and oral premedication was given. The patient was further sedated with Versed. The right ante-cubital area was prepped and draped but we could not advance a wire through the existing IV catheter in the right ante-cubital vein. The right groin was prepped and draped. A 7 Fr sheath was placed in the right femoral vein. A balloon tipped catheter was used to perform a right heart catheterization. The right wrist was assessed with a modified Allens test which was positive. The right wrist was prepped and draped in a sterile fashion. 1% lidocaine was used for local anesthesia. Using the modified Seldinger access technique, a 5 French sheath was placed in the right radial artery. 3 mg Verapamil was given through the sheath. 4300 units IV heparin was given. Standard diagnostic catheters were used to perform selective coronary angiography. I crossed the aortic valve with an AL-2 Catheter and a straight wire. LV pressures measured. No LV gram performed. The sheath was removed from the right radial artery and a Terumo hemostasis band was applied at the arteriotomy site on the right wrist.   No complications.    Coronary Findings    Dominance: Right   Left Anterior Descending  The vessel is large .   Marland Kitchen Mid LAD to Dist LAD lesion, 65% stenosed. calcified .   Marland Kitchen First Diagonal Branch   The vessel is moderate in size.   Marland Kitchen Second Diagonal Branch   The vessel is small in size.   . Third Diagonal Branch   The vessel is small in size.     Ramus Intermedius  The vessel is moderate in size .     Left Circumflex  The vessel is moderate in size .   Marland Kitchen  First Obtuse Marginal Branch   The vessel is small in size.   Marland Kitchen Second Obtuse Marginal Branch   The vessel is moderate in size.     Right Coronary Artery   . Prox RCA lesion, 20% stenosed. diffuse .   Marland Kitchen Mid RCA lesion, 20% stenosed. diffuse .   Marland Kitchen Dist RCA lesion, 20% stenosed. discrete .   Marland Kitchen Right Posterior Atrioventricular Branch   . Post Atrio  lesion, 20% stenosed. discrete .       Right Heart Pressures Hemodynamic findings consistent with mild pulmonary hypertension. LV EDP is normal.    Coronary Diagrams    Diagnostic Diagram            Implants    Name ID Temporary Type Supply   No information to display    PACS Images    Show images for Cardiac catheterization     Link to Procedure Log    Procedure Log      Hemo Data       Most Recent Value   Fick Cardiac Output  5.37 L/min   Fick Cardiac Output Index  2.49 (L/min)/BSA   Aortic Mean Gradient  28.4 mmHg   Aortic Peak Gradient  31 mmHg   Aortic Valve Area  0.92   Aortic Value Area Index  0.43 cm2/BSA   RA A Wave  7 mmHg   RA V Wave  8 mmHg   RA Mean  7 mmHg   RV Systolic Pressure  36 mmHg   RV Diastolic Pressure  5 mmHg   RV EDP  8 mmHg   PA Systolic Pressure  40 mmHg   PA Diastolic Pressure  16 mmHg   PA Mean  26 mmHg   PW A Wave  25 mmHg   PW V Wave  35 mmHg   PW Mean  21 mmHg   AO Systolic Pressure  564 mmHg   AO Diastolic Pressure  61 mmHg   AO Mean  81 mmHg   LV Systolic Pressure  332 mmHg   LV Diastolic Pressure  7 mmHg   LV EDP  17 mmHg   Arterial Occlusion Pressure Extended Systolic Pressure  951 mmHg   Arterial Occlusion Pressure Extended Diastolic Pressure  73 mmHg   Arterial Occlusion Pressure Extended Mean Pressure  95 mmHg   Left Ventricular Apex Extended Systolic Pressure  884 mmHg   Left Ventricular Apex Extended Diastolic Pressure  5 mmHg   Left Ventricular Apex Extended EDP Pressure  15 mmHg   QP/QS  1   TPVR Index  10.46 HRUI   TSVR Index  32.6 HRUI   PVR SVR Ratio  0.07   TPVR/TSVR Ratio  0.32     STS Risk Calculator  Procedure    AVR + CABG  Risk of Mortality   6.1% Morbidity or Mortality  27.9% Prolonged LOS   16.6% Short LOS    13.6% Permanent Stroke   2.9% Prolonged Vent Support  16.4% DSW Infection    0.3% Renal  Failure    7.7% Reoperation    12.3%   ACC/STS TAVR Risk Calculator  Procedure    TAVR - TF  Risk of Mortality   2.6%     Impression:  Patient has stage D severe symptomatic aortic stenosis. He lives an extremely sedentary lifestyle and tends to minimize reported symptoms related to congestive heart failure.  However, the patient's wife and daughter both note that he gets short of breath and fatigued  with very mild physical activity, consistent with chronic diastolic congestive heart failure, New York Heart Association functional class IIB-III.  However, the patient's overall functional status and quality of life seems to be limited more by other problems including longstanding peripheral neuropathy, severe physical deconditioning with extremely unsteady gait, generalized weakness, and worsening problems with short-term memory loss suggestive of some degree of underlying dementia.    I have personally reviewed the patient's recent transthoracic echocardiogram and diagnostic cardiac catheterization. Echocardiogram confirms the presence of severe aortic stenosis with severe thickening and restricted leaflet mobility involving all 3 leaflets of the patient's aortic valve. Peak velocity across the aortic valve measured 4 m/s corresponded to a mean transvalvular gradient estimated 38 mmHg. Left ventricular systolic function appears reasonably well preserved. Diagnostic cardiac catheterization confirmed the presence of severe aortic stenosis and also revealed presence of significant single-vessel coronary artery disease with long segment 65% stenosis of the mid left anterior descending coronary artery. Patient has not experience any symptoms suggestive of angina pectoris.  Because of the patient's advanced age and extremely debilitated physical condition I do not feel that the patient should be considered a candidate for aortic valve replacement using open conventional surgical techniques under any  circumstances.  Options include long-term palliative medical therapy versus transcatheter aortic valve replacement. At this point it remains unclear whether or not successful transcatheter aortic valve replacement would lead to any significant improvement in the patient's quality of life.   Plan:  The patient and his family were counseled at length regarding treatment alternatives for management of severe symptomatic aortic stenosis. Alternative approaches such as conventional aortic valve replacement, transcatheter aortic valve replacement, and palliative medical therapy were compared and contrasted at length.  The risks associated with conventional surgical aortic valve replacement were been discussed in detail, as were reasons why I feel the patient should not be considered a candidate for conventional surgery.  Expectations for the patient's recovery following uncomplicated transcatheter aortic valve replacement were discussed.  Long-term prognosis with medical therapy was discussed. This discussion was placed in the context of the patient's own specific clinical presentation and past medical history.  All of their questions been addressed.  The patient and his family will discuss these alternatives at length. If they remain interested in considering transcatheter aortic valve replacement the patient will be referred for CT angiography to further characterize the anatomical feasibility of TAVR and whether or not the patient has adequate pelvic vascular access to afford a transfemoral approach. The patient will also undergo pulmonary function tests and a formal physical therapy evaluation. The patient will be followed in the multidisciplinary heart valve clinic until a definitive decision regarding his treatment has been made.   I spent in excess of 90 minutes during the conduct of this office consultation and >50% of this time involved direct face-to-face encounter with the patient for counseling and/or  coordination of their care.    Valentina Gu. Roxy Manns, MD 08/06/2015 10:31 AM

## 2015-08-06 NOTE — Telephone Encounter (Signed)
Scheduled nurse visit for flu shot, td and tb skin test.  Forms for nursing home complete and will pickup when getting shots. Copied original put in scan.

## 2015-08-06 NOTE — Telephone Encounter (Signed)
New  RX called to [facility] for Prednisone 60 mg night before CT Scan 08/12/15 and Prednisone 60 mg the morning of CT Scan 08/13/15

## 2015-08-06 NOTE — Telephone Encounter (Signed)
°  Relation to DH:[id number]  Call back number: [phone]  Pharmacy:  Reason for call:  TB test requesting orders

## 2015-08-06 NOTE — Telephone Encounter (Signed)
Scheduled nurse visits.

## 2015-08-06 NOTE — Patient Instructions (Addendum)
Continue all previous medications without any changes at this time  Think about treatment options and schedule CT scans, PT consultation and pulmonary function tests if you are interested in proceeding with TAVR

## 2015-08-07 ENCOUNTER — Encounter: Payer: Self-pay | Admitting: Family

## 2015-08-07 ENCOUNTER — Ambulatory Visit (INDEPENDENT_AMBULATORY_CARE_PROVIDER_SITE_OTHER): Payer: Medicare HMO

## 2015-08-07 DIAGNOSIS — Z111 Encounter for screening for respiratory tuberculosis: Secondary | ICD-10-CM | POA: Diagnosis not present

## 2015-08-07 DIAGNOSIS — Z23 Encounter for immunization: Secondary | ICD-10-CM

## 2015-08-08 ENCOUNTER — Ambulatory Visit (HOSPITAL_COMMUNITY)
Admission: RE | Admit: 2015-08-08 | Discharge: 2015-08-08 | Disposition: A | Discharge status: Home / Self Care | Payer: Medicare HMO | Source: Ambulatory Visit | Attending: Family | Admitting: Family

## 2015-08-08 ENCOUNTER — Encounter: Payer: Self-pay | Admitting: Family

## 2015-08-08 ENCOUNTER — Ambulatory Visit (INDEPENDENT_AMBULATORY_CARE_PROVIDER_SITE_OTHER): Payer: Medicare HMO | Admitting: Family

## 2015-08-08 ENCOUNTER — Other Ambulatory Visit (HOSPITAL_COMMUNITY): Payer: Medicare HMO

## 2015-08-08 ENCOUNTER — Ambulatory Visit: Payer: Medicare HMO | Admitting: Family

## 2015-08-08 VITALS — BP 95/60 | HR 71 | Temp 97.1°F | Ht 76.0 in | Wt 190.0 lb

## 2015-08-08 DIAGNOSIS — E785 Hyperlipidemia, unspecified: Secondary | ICD-10-CM | POA: Insufficient documentation

## 2015-08-08 DIAGNOSIS — I708 Atherosclerosis of other arteries: Secondary | ICD-10-CM | POA: Insufficient documentation

## 2015-08-08 DIAGNOSIS — I714 Abdominal aortic aneurysm, without rupture, unspecified: Secondary | ICD-10-CM

## 2015-08-08 DIAGNOSIS — I2510 Atherosclerotic heart disease of native coronary artery without angina pectoris: Secondary | ICD-10-CM | POA: Diagnosis not present

## 2015-08-08 NOTE — Progress Notes (Signed)
[facility] [place]  Established Abdominal Aortic Aneurysm  History of Present Illness  Shawn Cortez is a 81 y.o. (1925-05-30) male  patient of Dr. Scot Dock followed for known AAA.   He returns today for follow up. The patient denies abdominal pain. He does have chronic back pain when supine, no new back pain. Patient denies claudication in legs but uses his walker since he has lost his sense of balance.  He has tingling and numbness in both hands which he reports has been attributed to c-spine issues.  Patient denies claudication, denies non-healing wounds.   States he has tinnitus for 30 years.  Previous studies demonstrate an AAA, measuring 4.2 cm.   He had a heart cath earlier this month (October 2016), wife states he needs his aortic valve replaced; he is in the process for evaluation for a possible TAVR. Daughter states he has recent memory loss and confusion.   Dr. Roxy Manns is his cardiac surgeon, Dr. Angelena Form and Dr. Oval Linsey are his cardiologists.   He takes Plavix for history of TIA.   He has tried knee high compression hose to address the edema in his ankles, but found them too uncomfortable. He takes a daily statin and Plavix.  Pt Diabetic: No  Pt smoker: former smoker, quit 1961    Past Medical History  Diagnosis Date  . Arthritis   . SOB (shortness of breath) on exertion   . Hyperlipidemia   . Stroke [facility])     TIA history 2005  . Hyperglycemia 01/04/2012  . Leg pain, right 03/01/2012  . Right leg weakness 03/01/2012  . Skin lesion of right arm 09/14/2012  . Abrasion of hand 09/14/2012  . Cerumen impaction 09/14/2012  . Unsteady gait 12/26/2012  . Insomnia 12/26/2012  . Bilateral groin pain 02/13/2013    L>R  . Peripheral edema 03/29/2013  . Preventative health care 06/26/2013  . AAA (abdominal aortic aneurysm) ([place])   . Atrial fibrillation ([place])   . Aortic stenosis, severe 07/02/2015  . Depression 06/17/2015  . New onset a-fib ([place])  06/17/2015  . Chronic diastolic congestive heart failure ([place])   . Hereditary and idiopathic peripheral neuropathy 08/12/2010  . SPINAL STENOSIS, LUMBAR 08/12/2010  . UNSTEADY GAIT 08/12/2010  . TIA 08/12/2010  . Coronary artery disease 08/01/2015    65% stenosis mid LAD   Past Surgical History  Procedure Laterality Date  . Thumb tendon severed and repaired      right, tendon snapped  . Carpal tunnel release      Right  . Achilles tendon repair      left, repaired w/ wire  . Brain surgery      done by Dr Lizabeth Leyden for tinnitius, unsuccessful, only on right  . Inguinal herniorrhapy  years ago    right and then 2 on left  . Lumbar laminectomy      L3-L5 posterior laminectomy 2006  . Joint replacement      Bilateral total knee replacement  . Spine surgery    . Cardiac catheterization N/A 08/01/2015    Procedure: Right/Left Heart Cath and Coronary Angiography;  Surgeon: Burnell Blanks, MD;  Location: [facility];  Service: Cardiovascular;  Laterality: N/A;   Social History Social History   Social History  . Marital Status: Married    Spouse Name: N/A  . Number of Children: N/A  . Years of Education: N/A   Occupational History  . Retired    Social History Main Topics  .  Smoking status: Former Smoker -- 3.00 packs/day    Quit date: 10/16/1960  . Smokeless tobacco: Never Used  . Alcohol Use: 8.4 oz/week    14 Cans of beer per week     Comment: 1-2 beer daily  . Drug Use: No  . Sexual Activity: Not on file   Other Topics Concern  . Not on file   Social History Narrative   Family History Family History  Problem Relation Age of Onset  . Other Mother     CHF  . Ulcers Mother     Venous stasis  . Varicose Veins Mother   . Other Father     CHF  . Cancer Sister     unknown  . Hip fracture Maternal Grandmother   . Pneumonia Maternal Grandmother     Current Outpatient Prescriptions on File Prior to Visit  Medication Sig Dispense Refill  . bisacodyl  (DULCOLAX) 5 MG EC tablet Take 5 mg by mouth 2 (two) times daily.    . clopidogrel (PLAVIX) 75 MG tablet TAKE 1 TABLET BY MOUTH ATBEDTIME. 90 tablet 1  . fish oil-omega-3 fatty acids 1000 MG capsule Take 2 g by mouth at bedtime.     Marland Kitchen ibuprofen (ADVIL,MOTRIN) 200 MG tablet Take 200 mg by mouth daily as needed (pain).    . LORazepam (ATIVAN) 0.5 MG tablet Take 1 tablet (0.5 mg total) by mouth at bedtime as needed for anxiety. (Patient taking differently: Take 0.5 mg by mouth at bedtime as needed for sleep. ) 90 tablet 0  . Polyvinyl Alcohol-Povidone (REFRESH OP) Place 1 drop into both eyes daily as needed (itching/watery eyes).    Derrill Memo ON 08/12/2015] predniSONE (DELTASONE) 20 MG tablet Take 20 mg by mouth once. Take 60 mg (3 tabs) total of Prednisone on 08/12/15 the night before CT scan; and 60 mg (3 tabs) the morning of CT scan 08/13/15    . simvastatin (ZOCOR) 40 MG tablet TAKE 1 TABLET BY MOUTH DAILY AT 6 PM. (Patient taking differently: TAKE 1 TABLET BY MOUTH DAILY AT BEDTIME) 90 tablet 1   No current facility-administered medications on file prior to visit.   Allergies  Allergen Reactions  . Niacin And Related Rash    Facial redness and Rash head to toe  . Omnipaque [Iohexol] Rash    Facial redness and flushing    ROS: See HPI for pertinent positives and negatives.  Physical Examination  Filed Vitals:   08/08/15 1250  BP: 95/60  Pulse: 71  Temp: 97.1 F (36.2 C)  TempSrc: Oral  Height: 6\' 4"  (1.93 m)  Weight: 190 lb (86.183 kg)  SpO2: 100%   Body mass index is 23.14 kg/(m^2).  General: A&O x 3, WD.  Pulmonary: Sym exp, good air movt, CTAB, no rales, rhonchi, & wheezing.  Cardiac: RRR, Nl S1, S2, no murmur detected.  Carotid Bruits  Left  Right    Negative  Negative    Aorta is not palpable. Radial pulses are not palpable but  have audible Doppler signals, bilateral brachial pulses are 2+ palpable  VASCULAR EXAM:  LE Pulses  LEFT  RIGHT   FEMORAL   palpable  palpable   POPLITEAL  not palpable  not palpable   POSTERIOR TIBIAL  palpable  palpable   DORSALIS PEDIS  ANTERIOR TIBIAL  palpable  palpable    Gastrointestinal: soft, NTND, -G/R, - HSM, - masses palpated, - CVAT B.  Musculoskeletal: M/S 5/5 throughout except right LE is 3/5, extremities without  ischemic changes. 1+ pitting edema in right lower leg, 2+ in left lower leg. Neurologic: CN 2-12 intact except hard of hearing, Pain and light touch intact in extremities, Motor exam as listed above  07/23/11 CTA:  The infrarenal abdominal aortic aneurysm measures 4.7 x 4.2 cm  compared 4.1 x 4.6 cm previously, stable. Bilateral common iliac  artery aneurysms, 2.4 cm on the right and 2.5 cm on the left, both  stable. Aorta and iliac vessels are heavily calcified.  Moderate stool throughout the colon. Appendix is visualized and is  normal. Small bowel is decompressed. No free fluid, free air or  adenopathy.  No acute bony abnormality. Degenerative changes in the lumbar  spine.  IMPRESSION:  4.7 cm infrarenal abdominal aortic aneurysm, not significantly  changed since prior study. Stable common iliac artery aneurysms.         Non-Invasive Vascular Imaging  AAA Duplex (08/08/2015)  Previous size: 4.21 cm (Date: 07/26/2014)  Current size:  4.39 cm (Date: 08/08/2015)  Medical Decision Making  The patient is a 81 y.o. male who presents with asymptomatic AAA with no significant increase in size in a year.   Based on this patient's exam and diagnostic studies, the patient will follow up in 1 year  with the following studies: AAA Duplex.  Consideration for repair of AAA would be made when the size is 5.5 cm, growth > 1 cm/yr, and symptomatic status.  I emphasized the importance of maximal medical management including strict control of blood pressure, blood glucose, and lipid levels, antiplatelet agents, obtaining regular exercise, and continued  cessation  of smoking.   The patient was given information about AAA including signs, symptoms, treatment, and how to minimize the risk of enlargement and rupture of aneurysms.    The patient was advised to call 911 should the patient experience sudden onset abdominal or back pain.   Thank you for allowing [place] to participate in this patient's care.  Clemon Chambers, RN, MSN, FNP-C [facility] [place] Office: [phone]  Clinic Physician: Scot Dock  08/08/2015, 1:11 PM

## 2015-08-08 NOTE — Patient Instructions (Signed)
Abdominal Aortic Aneurysm An aneurysm is a weakened or damaged part of an artery wall that bulges from the normal force of blood pumping through the body. An abdominal aortic aneurysm is an aneurysm that occurs in the lower part of the aorta, the main artery of the body.  The major concern with an abdominal aortic aneurysm is that it can enlarge and burst (rupture) or blood can flow between the layers of the wall of the aorta through a tear (aorticdissection). Both of these conditions can cause bleeding inside the body and can be life threatening unless diagnosed and treated promptly. CAUSES  The exact cause of an abdominal aortic aneurysm is unknown. Some contributing factors are:   A hardening of the arteries caused by the buildup of fat and other substances in the lining of a blood vessel (arteriosclerosis).  Inflammation of the walls of an artery (arteritis).   Connective tissue diseases, such as Marfan syndrome.   Abdominal trauma.   An infection, such as syphilis or staphylococcus, in the wall of the aorta (infectious aortitis) caused by bacteria. RISK FACTORS  Risk factors that contribute to an abdominal aortic aneurysm may include:  Age older than 60 years.   High blood pressure (hypertension).  Male gender.  Ethnicity (white race).  Obesity.  Family history of aneurysm (first degree relatives only).  Tobacco use. PREVENTION  The following healthy lifestyle habits may help decrease your risk of abdominal aortic aneurysm:  Quitting smoking. Smoking can raise your blood pressure and cause arteriosclerosis.  Limiting or avoiding alcohol.  Keeping your blood pressure, blood sugar level, and cholesterol levels within normal limits.  Decreasing your salt intake. In somepeople, too much salt can raise blood pressure and increase your risk of abdominal aortic aneurysm.  Eating a diet low in saturated fats and cholesterol.  Increasing your fiber intake by including  whole grains, vegetables, and fruits in your diet. Eating these foods may help lower blood pressure.  Maintaining a healthy weight.  Staying physically active and exercising regularly. SYMPTOMS  The symptoms of abdominal aortic aneurysm may vary depending on the size and rate of growth of the aneurysm.Most grow slowly and do not have any symptoms. When symptoms do occur, they may include:  Pain (abdomen, side, lower back, or groin). The pain may vary in intensity. A sudden onset of severe pain may indicate that the aneurysm has ruptured.  Feeling full after eating only small amounts of food.  Nausea or vomiting or both.  Feeling a pulsating lump in the abdomen.  Feeling faint or passing out. DIAGNOSIS  Since most unruptured abdominal aortic aneurysms have no symptoms, they are often discovered during diagnostic exams for other conditions. An aneurysm may be found during the following procedures:  Ultrasonography (A one-time screening for abdominal aortic aneurysm by ultrasonography is also recommended for all men aged 65-75 years who have ever smoked).  X-ray exams.  A computed tomography (CT).  Magnetic resonance imaging (MRI).  Angiography or arteriography. TREATMENT  Treatment of an abdominal aortic aneurysm depends on the size of your aneurysm, your age, and risk factors for rupture. Medication to control blood pressure and pain may be used to manage aneurysms smaller than 6 cm. Regular monitoring for enlargement may be recommended by your caregiver if:  The aneurysm is 3-4 cm in size (an annual ultrasonography may be recommended).  The aneurysm is 4-4.5 cm in size (an ultrasonography every 6 months may be recommended).  The aneurysm is larger than 4.5 cm in   size (your caregiver may ask that you be examined by a vascular surgeon). If your aneurysm is larger than 6 cm, surgical repair may be recommended. There are two main methods for repair of an aneurysm:   Endovascular  repair (a minimally invasive surgery). This is done most often.  Open repair. This method is used if an endovascular repair is not possible.   This information is not intended to replace advice given to you by your health care provider. Make sure you discuss any questions you have with your health care provider.   Document Released: 07/23/2005 Document Revised: 02/07/2013 Document Reviewed: 11/12/2012 Elsevier Interactive Patient Education 2016 [employer]  

## 2015-08-09 DIAGNOSIS — Z111 Encounter for screening for respiratory tuberculosis: Secondary | ICD-10-CM

## 2015-08-09 LAB — TB SKIN TEST
Induration: 0 mm
TB SKIN TEST: NEGATIVE

## 2015-08-09 NOTE — Telephone Encounter (Signed)
Forms placed up front for pick up and copy sent for scanning. JG//CMA

## 2015-08-10 NOTE — Addendum Note (Signed)
Addended by: Mena Goes on: 08/10/2015 02:31 PM   Modules accepted: Orders

## 2015-08-13 ENCOUNTER — Encounter (HOSPITAL_COMMUNITY): Payer: Self-pay

## 2015-08-13 ENCOUNTER — Ambulatory Visit (HOSPITAL_COMMUNITY): Payer: Medicare HMO

## 2015-08-13 ENCOUNTER — Ambulatory Visit (HOSPITAL_COMMUNITY)
Admission: RE | Admit: 2015-08-13 | Discharge: 2015-08-13 | Disposition: A | Discharge status: Home / Self Care | Payer: Medicare HMO | Source: Ambulatory Visit | Attending: Thoracic Surgery (Cardiothoracic Vascular Surgery) | Admitting: Thoracic Surgery (Cardiothoracic Vascular Surgery)

## 2015-08-13 DIAGNOSIS — J8410 Pulmonary fibrosis, unspecified: Secondary | ICD-10-CM | POA: Diagnosis not present

## 2015-08-13 DIAGNOSIS — I723 Aneurysm of iliac artery: Secondary | ICD-10-CM | POA: Diagnosis not present

## 2015-08-13 DIAGNOSIS — I714 Abdominal aortic aneurysm, without rupture: Secondary | ICD-10-CM | POA: Insufficient documentation

## 2015-08-13 DIAGNOSIS — I350 Nonrheumatic aortic (valve) stenosis: Secondary | ICD-10-CM

## 2015-08-13 DIAGNOSIS — K753 Granulomatous hepatitis, not elsewhere classified: Secondary | ICD-10-CM | POA: Insufficient documentation

## 2015-08-13 DIAGNOSIS — J90 Pleural effusion, not elsewhere classified: Secondary | ICD-10-CM | POA: Diagnosis not present

## 2015-08-13 DIAGNOSIS — Z01818 Encounter for other preprocedural examination: Secondary | ICD-10-CM | POA: Diagnosis present

## 2015-08-13 DIAGNOSIS — K8020 Calculus of gallbladder without cholecystitis without obstruction: Secondary | ICD-10-CM | POA: Insufficient documentation

## 2015-08-13 DIAGNOSIS — I517 Cardiomegaly: Secondary | ICD-10-CM | POA: Diagnosis not present

## 2015-08-13 DIAGNOSIS — I700 Atherosclerosis of aorta: Secondary | ICD-10-CM | POA: Insufficient documentation

## 2015-08-13 LAB — PULMONARY FUNCTION TEST
DL/VA % pred: 80 %
DL/VA: 3.87 ml/min/mmHg/L
DLCO unc % pred: 58 %
DLCO unc: 22.13 ml/min/mmHg
FEF 25-75 POST: 1.9 L/s
FEF 25-75 PRE: 1.4 L/s
FEF2575-%Change-Post: 35 %
FEF2575-%PRED-PRE: 72 %
FEF2575-%Pred-Post: 97 %
FEV1-%Change-Post: 6 %
FEV1-%PRED-PRE: 93 %
FEV1-%Pred-Post: 99 %
FEV1-POST: 3.02 L
FEV1-PRE: 2.83 L
FEV1FVC-%Change-Post: 8 %
FEV1FVC-%Pred-Pre: 89 %
FEV6-%CHANGE-POST: 0 %
FEV6-%PRED-POST: 110 %
FEV6-%PRED-PRE: 110 %
FEV6-POST: 4.46 L
FEV6-PRE: 4.48 L
FEV6FVC-%Change-Post: 1 %
FEV6FVC-%PRED-POST: 107 %
FEV6FVC-%Pred-Pre: 106 %
FVC-%Change-Post: -1 %
FVC-%PRED-POST: 102 %
FVC-%PRED-PRE: 104 %
FVC-POST: 4.47 L
FVC-Pre: 4.53 L
PRE FEV1/FVC RATIO: 62 %
Post FEV1/FVC ratio: 68 %
Post FEV6/FVC ratio: 100 %
Pre FEV6/FVC Ratio: 99 %

## 2015-08-13 IMAGING — CT CT ANGIO CHEST
1 series · 1 of 10 positions shown · IV contrast (omnipaque)
Comparison: CT the abdomen and pelvis 01/21/2012. Chest CT
12/01/2006.

CLINICAL DATA: 89-year-old male with history of severe aortic
stenosis. Preprocedural study prior to potential transcatheter
aortic valve replacement (TAVR).

EXAM:
CT ANGIOGRAPHY CHEST, ABDOMEN AND PELVIS
TECHNIQUE: Multidetector CT imaging through the chest, abdomen and pelvis was
performed using the standard protocol during bolus administration of
intravenous contrast. Multiplanar reconstructed images and MIPs were
obtained and reviewed to evaluate the vascular anatomy.
CONTRAST:  80mL OMNIPAQUE IOHEXOL 350 MG/ML SOLN

[Series 217: — · 0.27mm/px · 1 of 10 slices shown]
[im 6/10]
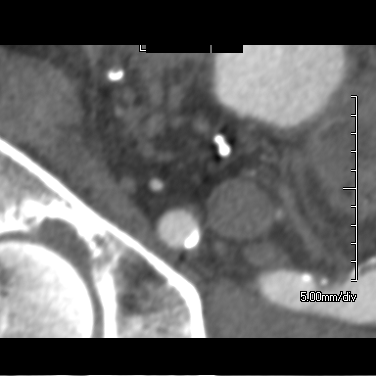

[1 of 10 positions shown; findings below may reference images not displayed]

FINDINGS: CTA CHEST FINDINGS

Mediastinum/Lymph Nodes: Heart size is mildly enlarged. There is no
significant pericardial fluid, thickening or pericardial
calcification. Tiny filling defect in the tip of the left atrial
appendage, best appreciated on image 84 of series 401, suspicious
for potential thrombus. No pathologically enlarged mediastinal or
hilar lymph nodes. Coarsely calcified left hilar and AP window lymph
nodes. Esophagus is unremarkable in appearance. No axillary
lymphadenopathy.

Lungs/Pleura: Small right and trace left pleural effusions lying
dependently. Patchy areas of peripheral predominant ground-glass
attenuation in the right upper and lower lobes, with some associated
septal thickening, particularly in the right upper lobe, favored to
reflect areas of post infectious or inflammatory scarring. Minimal
dependent subsegmental atelectasis in the lower lobes of the lungs
bilaterally. No acute consolidative airspace disease. No suspicious
appearing pulmonary nodules or masses. Small calcified granulomas in
the left upper lobe.

Musculoskeletal/Soft Tissues: There are no aggressive appearing
lytic or blastic lesions noted in the visualized portions of the
skeleton.

CTA ABDOMEN AND PELVIS FINDINGS

Hepatobiliary: Numerous small calcified granulomas throughout the
liver. No definite suspicious cystic or solid hepatic lesions are
identified. No intra or extrahepatic biliary ductal dilatation.
Several small partially calcified gallstones lie dependently in the
gallbladder. No findings to suggest an acute cholecystitis at this
time.

Pancreas: No pancreatic mass. No pancreatic ductal dilatation. No
pancreatic or peripancreatic fluid or inflammatory changes.

Spleen: Multiple calcified granulomas in the spleen.

Adrenals/Urinary Tract: Bilateral adrenal glands are normal in
appearance. Mild atrophy of the kidneys bilaterally. No suspicious
renal lesions. No hydroureteronephrosis. Urinary bladder is normal
in appearance.

Stomach/Bowel: Normal appearance of the stomach. No pathologic
dilatation of small bowel or colon.

Vascular/Lymphatic: Extensive atherosclerosis throughout the
abdominal and pelvic vasculature, with vascular findings and
measurements pertinent to potential TAVR procedure, as detailed
below. In addition, there is a fusiform infrarenal abdominal aortic
aneurysm which measures up to 4.7 x 5.5 cm in diameter (image 199 of
series 401), and spans a craniocaudal length of approximately
cm. There is also aneurysmal dilatation of the common iliac arteries
bilaterally measuring up to 3.0 cm on the left and 2.8 cm on the
right. Separate origins of the splenic artery and common hepatic
artery directly from the aorta (i.e., no normal celiac axis). Left
gastric extends off the common hepatic artery. Single left renal
artery. Two right renal arteries (small accessory branch to the
right lower pole). Superior mesenteric artery and inferior
mesenteric artery are both widely patent. Inferior mesenteric artery
origin is from the inferior aspect of the infrarenal abdominal
aortic aneurysm sac. No lymphadenopathy noted in the abdomen or
pelvis.

Reproductive: Prostate gland and seminal vesicles are unremarkable
in appearance.

Other: Postoperative changes in the left inguinal region presumably
from prior left inguinal herniorrhaphy. Immediately superficial to
this there is a 1.6 x 3.2 x 5.5 cm intermediate collection of
material (image 232 of series 401), likely proteinaceous fluid. No
significant volume of ascites. No pneumoperitoneum.

Musculoskeletal: Multiple lucencies in the femoral heads and necks
bilaterally appear symmetric, and are an isolated finding, favored
to reflect aggressive osteoporosis. Old compression fracture of L2
with approximately 25% loss of anterior vertebral body height,
similar to prior study from 01/21/2012. Status post laminectomy from
L3-L5. There are no aggressive appearing lytic or blastic lesions
noted in the visualized portions of the skeleton.

VASCULAR MEASUREMENTS PERTINENT TO TAVR:

AORTA:

Minimal Aortic Diameter -  17 x 15 mm

Severity of Aortic Calcification -  severe

RIGHT PELVIS:

Right Common Iliac Artery -

Minimal Diameter - 7.8 x 7.0 mm

Tortuosity - Mild

Calcification - Severe

Right External Iliac Artery -

Minimal Diameter - 9.5 x 9.5 mm

Tortuosity - Severe

Calcification - Mild

Right Common Femoral Artery -

Minimal Diameter - 10.9 x 8.2 mm

Tortuosity - Mild

Calcification - Moderate

LEFT PELVIS:

Left Common Iliac Artery -

Minimal Diameter - 12.3 x 10.8 mm

Tortuosity - Mild

Calcification - Severe

Left External Iliac Artery -

Minimal Diameter - 9.9 x 7.9 mm

Tortuosity - Severe

Calcification - Mild

Left Common Femoral Artery -

Minimal Diameter - 9.6 x 8.2 mm

Tortuosity - Mild

Calcification - Moderate

Review of the MIP images confirms the above findings.
IMPRESSION: 1. Vascular findings and measurements pertinent to potential T AVR
procedure, as detailed above. Although this patient does have
suitable pelvic arterial access from a vessel size perspective,
there is extreme tortuosity of the external iliac arteries
bilaterally.
2. Tiny filling defect in the tip of the left atrial appendage
suspicious for a small amount of thrombus. Correlation with
transesophageal echocardiogram is suggested if not recently
performed.
3. Severe atherosclerosis throughout the abdominal and pelvic
vasculature, including fusiform infrarenal abdominal aortic aneurysm
and bilateral common iliac artery aneurysms, as detailed above.
4. Small right and trace left pleural effusions layering
dependently. Patchy areas of ground-glass attenuation and septal
thickening in the right lung, favored to reflect areas of post
infectious or inflammatory scarring. Strictly speaking, acute
infection is not entirely excluded, and clinical correlation is
suggested.
5. Additional incidental findings, as above.

## 2015-08-13 MED ORDER — IOHEXOL 350 MG/ML SOLN
80.0000 mL | Freq: Once | INTRAVENOUS | Status: AC | PRN
  Administered 2015-08-13: 80 mL via INTRAVENOUS

## 2015-08-13 MED ORDER — METOPROLOL TARTRATE 1 MG/ML IV SOLN
5.0000 mg | Freq: Once | INTRAVENOUS | Status: AC
  Administered 2015-08-13: 5 mg via INTRAVENOUS
  Filled 2015-08-13: qty 5

## 2015-08-13 MED ORDER — METOPROLOL TARTRATE 1 MG/ML IV SOLN
INTRAVENOUS | Status: AC
  Filled 2015-08-13: qty 5

## 2015-08-13 MED ORDER — ALBUTEROL SULFATE (2.5 MG/3ML) 0.083% IN NEBU
2.5000 mg | INHALATION_SOLUTION | Freq: Once | RESPIRATORY_TRACT | Status: AC
  Administered 2015-08-13: 2.5 mg via RESPIRATORY_TRACT

## 2015-08-15 ENCOUNTER — Encounter: Payer: Self-pay | Admitting: Surgery

## 2015-08-15 ENCOUNTER — Encounter: Payer: Self-pay | Admitting: Physical Therapy

## 2015-08-15 ENCOUNTER — Institutional Professional Consult (permissible substitution) (INDEPENDENT_AMBULATORY_CARE_PROVIDER_SITE_OTHER): Payer: Medicare HMO | Admitting: Surgery

## 2015-08-15 ENCOUNTER — Ambulatory Visit: Payer: Medicare HMO | Attending: Thoracic Surgery (Cardiothoracic Vascular Surgery) | Admitting: Physical Therapy

## 2015-08-15 VITALS — BP 83/38 | HR 54 | Resp 20 | Ht 76.0 in | Wt 190.0 lb

## 2015-08-15 DIAGNOSIS — R2681 Unsteadiness on feet: Secondary | ICD-10-CM | POA: Diagnosis present

## 2015-08-15 DIAGNOSIS — I350 Nonrheumatic aortic (valve) stenosis: Secondary | ICD-10-CM | POA: Diagnosis not present

## 2015-08-15 DIAGNOSIS — I2510 Atherosclerotic heart disease of native coronary artery without angina pectoris: Secondary | ICD-10-CM | POA: Diagnosis not present

## 2015-08-15 DIAGNOSIS — I2583 Coronary atherosclerosis due to lipid rich plaque: Secondary | ICD-10-CM

## 2015-08-15 DIAGNOSIS — I5032 Chronic diastolic (congestive) heart failure: Secondary | ICD-10-CM

## 2015-08-15 DIAGNOSIS — M6281 Muscle weakness (generalized): Secondary | ICD-10-CM

## 2015-08-15 DIAGNOSIS — R262 Difficulty in walking, not elsewhere classified: Secondary | ICD-10-CM

## 2015-08-15 DIAGNOSIS — R293 Abnormal posture: Secondary | ICD-10-CM

## 2015-08-15 DIAGNOSIS — I4891 Unspecified atrial fibrillation: Secondary | ICD-10-CM | POA: Diagnosis not present

## 2015-08-15 NOTE — Therapy (Signed)
[place], [place], [address] Phone: [phone]   Fax:  [phone]  Physical Therapy Evaluation  Patient Details  Name: Shawn Cortez MRN: [id number] Date of Birth: [birth year] Referring Provider: Dr. Darylene Price  Encounter Date: 08/15/2015      PT End of Session - 08/15/15 1522    Visit Number 1   PT Start Time 2778   PT Stop Time 1125   PT Time Calculation (min) 50 min   Equipment Utilized During Treatment Gait belt   Activity Tolerance Patient tolerated treatment well   Behavior During Therapy [facility] for tasks assessed/performed      Past Medical History  Diagnosis Date  . Arthritis   . SOB (shortness of breath) on exertion   . Hyperlipidemia   . Stroke [facility])     TIA history 2005  . Hyperglycemia 01/04/2012  . Leg pain, right 03/01/2012  . Right leg weakness 03/01/2012  . Skin lesion of right arm 09/14/2012  . Abrasion of hand 09/14/2012  . Cerumen impaction 09/14/2012  . Unsteady gait 12/26/2012  . Insomnia 12/26/2012  . Bilateral groin pain 02/13/2013    L>R  . Peripheral edema 03/29/2013  . Preventative health care 06/26/2013  . AAA (abdominal aortic aneurysm) ([place])   . Atrial fibrillation ([place])   . Aortic stenosis, severe 07/02/2015  . Depression 06/17/2015  . New onset a-fib ([place]) 06/17/2015  . Chronic diastolic congestive heart failure ([place])   . Hereditary and idiopathic peripheral neuropathy 08/12/2010  . SPINAL STENOSIS, LUMBAR 08/12/2010  . UNSTEADY GAIT 08/12/2010  . TIA 08/12/2010  . Coronary artery disease 08/01/2015    65% stenosis mid LAD    Past Surgical History  Procedure Laterality Date  . Thumb tendon severed and repaired      right, tendon snapped  . Carpal tunnel release      Right  . Achilles tendon repair      left, repaired w/ wire  . Brain surgery      done by Dr Lizabeth Leyden for tinnitius, unsuccessful, only on right  . Inguinal herniorrhapy  years ago    right  and then 2 on left  . Lumbar laminectomy      L3-L5 posterior laminectomy 2006  . Joint replacement      Bilateral total knee replacement  . Spine surgery    . Cardiac catheterization N/A 08/01/2015    Procedure: Right/Left Heart Cath and Coronary Angiography;  Surgeon: Burnell Blanks, MD;  Location: [facility];  Service: Cardiovascular;  Laterality: N/A;    There were no vitals filed for this visit.  Visit Diagnosis:  Difficulty walking - Plan: PT plan of care cert/re-cert  Generalized muscle weakness - Plan: PT plan of care cert/re-cert  Unsteadiness - Plan: PT plan of care cert/re-cert  Abnormal posture - Plan: PT plan of care cert/re-cert  Severe aortic stenosis - Plan: PT plan of care cert/re-cert      Subjective Assessment - 08/15/15 1445    Subjective Pt not great historian and reports recent memory loss following a dental procedure in the last year. Wife and daughter give most of history. Report a recent increase in SOB with simple activity such as walking across the room. Deny any reports of chest pain or tightness.   Patient Stated Goals None stated - wants to pursue work up for possible TAVR surgery   Pain Score --  attributes to his neuropathy  [facility] PT Assessment - 08/15/15 0001    Assessment   Medical Diagnosis severe aortic stenosis   Referring Provider Dr. Darylene Price   Onset Date/Surgical Date 03/01/15   Precautions   Precautions Fall   Restrictions   Weight Bearing Restrictions No   Balance Screen   Has the patient fallen in the past 6 months No   Has the patient had a decrease in activity level because of a fear of falling?  No   Is the patient reluctant to leave their home because of a fear of falling?  No   Home Social worker Private residence   Living Arrangements Spouse/significant other   Type of [place]  grown floor townhome wiht 1 step   Prior Function   Level of Independence  Independent with household mobility with device   Cognition   Overall Cognitive Status --  reports difficulty with memory since recent dental procedure   Posture/Postural Control   Posture/Postural Control Postural limitations   Postural Limitations Forward head;Rounded Shoulders;Decreased lumbar lordosis;Flexed trunk   Posture Comments overall sitting posture fair, standing posture poor   ROM / Strength   AROM / PROM / Strength AROM;Strength   AROM   Overall AROM  Within functional limits for tasks performed   Overall AROM Comments significantly tight hamstrings bilaterally   Strength   Overall Strength Comments grossly 4/5 throughout UE; L hip flexion 3+/5, knee extension 4/5 bil within available range   Strength Assessment Site Hand   Right/Left hand Right;Left   Right Hand Grip (lbs) 30  R hand dominant   Left Hand Grip (lbs) 34   Ambulation/Gait   Ambulation/Gait Yes   Ambulation/Gait Assistance 5: Supervision   Ambulation Distance (Feet) 65 Feet  followed by 105 after a 2+ min break   Assistive device Rolling walker  pt utilizes rollator at home   Gait Pattern Trunk flexed;Right flexed knee in stance;Left flexed knee in stance;Poor foot clearance - left;Poor foot clearance - right  strikes with L foot in eversion/pronation   Gait velocity 1.2  ft/sec          [facility] Pre-Surgical Assessment - 08/15/15 0001    5 Meter Walk Test- trial 1 14.7 sec  one trial only, >6 sec indicates slow speed   Comments Not indicated - pt at high fall risk per other testing   4 Stage Balance Test tolerated for:  0 sec.   4 Stage Balance Test Position 1   comment unable to stand statically without UE support   Comment unable to sit to stand without UE support   ADL/IADL Needs Assistance with: Bathing;Dressing;Meal prep;Finances;Yard work   ADL/IADL Therapist, sports Index Moderately frail   6 Minute Walk- Baseline yes   BP (mmHg) 92/50 mmHg   HR (bpm) 82   02 Sat (%RA) 99 %   Modified Borg  Scale for Dyspnea 0- Nothing at all   Perceived Rate of Exertion (Borg) 6-   6 Minute Walk Post Test yes   BP (mmHg) 110/60 mmHg   HR (bpm) 99   02 Sat (%RA) 97 %   Modified Borg Scale for Dyspnea 2- Mild shortness of breath   Perceived Rate of Exertion (Borg) 16-   Aerobic Endurance Distance Walked 170   Endurance additional comments Pt amb with 2 wheeled RW with supervision and wheelchair follow for safety. Pt required seated rest break at 1:20 and 65' due to fatigue/SOB. Pt able to resume after 2+ minutes and amb.  additional 105'. Pt reported the first walk was the longest he had walked in quite some time.                          PT Education - August 17, 2015 1521    Education provided Yes   Education Details fall risk, continued use of rollator/transport chair, benefits of freqeunt short distance walking in the house for flexibility/strengthening/general mobility   Person(s) Educated Patient;Child(ren)   Methods Explanation   Comprehension Verbalized understanding                    Plan - 2015/08/17 1522    Clinical Impression Statement Pt is an 81 yo male present for OP PT evaluation as he is being considered for possible TAVR surgery due to severe aortic stenosis. Pt presents with wife and daughter who are key historians as pt reports decreased memory following a recent dental procedure. Pt has a history of declining mobility over the past several years due to variety of health issues including possible TIA and peripheral neuropathy. Pt has been using rollator walker as primary device within household for a couple of years and has just recently began using a transport chair for community mobiity in the last 2 months due to increasing difficulty in walking. Pt requires assist from his wife for most household tasks and although they live in a one level townhome at time of eval, they report plans to move into [facility] (assisted living) next month. Pt presents  with fair strength per MMT yet poor functional strength in his lower extremities as he is unable to arise from chair independently without heavy UE support and increased time. His ROM is WFL except his knee extension is lacking about 20-30 degrees likely due to significant portions of his day spent in a seated position. Balance is poor and he is at high fall risk. His gait speed is slow and he demonstrates a forward flexed posture with heavy UE support on RW and poor foot clearance and heavy knee flexion in stance. His endurance is also poor as he can only ambulate 65-105' without required seated rest. Pt's daughter inquired about PT for patient while awaiting potential cardiac surgery and with his upcoming transition into [facility] as PT is offered onsite. PT encouraged daughter to speak with cardiac surgeon's about this potential as pt may benefit from general strengthening, balance, and mobility training if medically stable to do so.   PT Frequency One time visit   Consulted and Agree with Plan of Care Patient;Family member/caregiver          G-Codes - 17-Aug-2015 1534    Functional Assessment Tool Used 170' with RW   Functional Limitation Mobility: Walking and moving around   Mobility: Walking and Moving Around Current Status [phone]) At least 60 percent but less than 80 percent impaired, limited or restricted   Mobility: Walking and Moving Around Goal Status [phone]) At least 60 percent but less than 80 percent impaired, limited or restricted   Mobility: Walking and Moving Around Discharge Status [phone]) At least 60 percent but less than 80 percent impaired, limited or restricted       Problem List Patient Active Problem List   Diagnosis Date Noted  . Chronic diastolic congestive heart failure ([place])   . Coronary artery disease 08/01/2015  . Aortic stenosis   . Aortic stenosis, severe 07/02/2015  . New onset a-fib ([place]) 06/17/2015  . Rectal bleeding  06/17/2015  . SOB (shortness of  breath) 06/17/2015  . Depression 06/17/2015  . Medicare annual wellness visit, subsequent 07/31/2014  . Preventative health care 06/26/2013  . Peripheral edema 03/29/2013  . Bilateral groin pain 02/13/2013  . Insomnia 12/26/2012  . Skin lesion of right arm 09/14/2012  . Cerumen impaction 09/14/2012  . Right leg weakness 03/01/2012  . Hyperglycemia 01/04/2012  . Tinnitus of right ear 05/08/2011  . COLONIC POLYPS 08/12/2010  . Hyperlipidemia 08/12/2010  . Hereditary and idiopathic peripheral neuropathy 08/12/2010  . DECREASED HEARING, RIGHT EAR 08/12/2010  . TIA 08/12/2010  . Abdominal aortic aneurysm ([place]) 08/12/2010  . SPINAL STENOSIS, LUMBAR 08/12/2010  . UNSTEADY GAIT 08/12/2010  . CARDIAC MURMUR 08/12/2010  . MEASLES, HX OF 08/12/2010  . CH[place]ENPOX, HX OF 08/12/2010    [place], PT 08/15/2015, 3:37 PM  [facility] [address] [place], [place], [address] Phone: [phone]   Fax:  [phone]  Name: Shawn Cortez MRN: [id number] Date of Birth: April 01, 1925

## 2015-08-16 ENCOUNTER — Encounter: Payer: Self-pay | Admitting: Surgery

## 2015-08-16 NOTE — Progress Notes (Signed)
Patient ID: Shawn Cortez, male   DOB: 04-20-1925, 81 y.o.   MRN: [id number]   [facility]SULTATION REPORT  Referring Provider is Skeet Latch, MD PCP is Penni Homans, MD  Chief Complaint  Patient presents with  . Aortic Stenosis    2nd TAVR eval, review all imagings and test results    HPI:  The patient is an 81 year old gentleman with a history of aortic stenosis, recent onset of paroxysmal atrial fibrillation, and chronic diastolic heart failure who has been very limited in his activity due to peripheral neuropathy and previous stroke and TIA's with unstable balance and severe deconditioning. He is with his wife and daughter today who answer most of the questions for him since he has some dementia with slowed mentation and short term memory loss. They say that he spends most of his time in a chair watching TV and only walks about 10 ft at a time using a rolator. He was seen by his PCP in August and noted to be in atrial fibrillation. He has an echo that showed progression of his AS with a peak velocity of 4 m/s corresponding to a mean gradient of 38 mm Hg. LVEF was 50-55%. He underwent cardiac cath confirming severe AS with a mean gradient of 28 mm Hg and a valve area of 0.92 cm2. There was a 65% tubular stenosis in the mid LAD and otherwise only non-obstructive disease. He was seen by Dr. Roxy Manns who was concerned that the patient's overall functional status and quality of life seems to be limited more by other problems including longstanding peripheral neuropathy, severe physical deconditioning with unsteady gait, generalized weakness and deconditioning, and short-term memory loss suggestive of some degree of underlying dementia. The patient and his family decided to proceed with CT scanning to determine if he would be an anatomical candidate for TAVR since Dr. Roxy Manns did not feel that he was an open  surgical candidate.   Past Medical History  Diagnosis Date  . Arthritis   . SOB (shortness of breath) on exertion   . Hyperlipidemia   . Stroke [facility])     TIA history 2005  . Hyperglycemia 01/04/2012  . Leg pain, right 03/01/2012  . Right leg weakness 03/01/2012  . Skin lesion of right arm 09/14/2012  . Abrasion of hand 09/14/2012  . Cerumen impaction 09/14/2012  . Unsteady gait 12/26/2012  . Insomnia 12/26/2012  . Bilateral groin pain 02/13/2013    L>R  . Peripheral edema 03/29/2013  . Preventative health care 06/26/2013  . AAA (abdominal aortic aneurysm) ([place])   . Atrial fibrillation ([place])   . Aortic stenosis, severe 07/02/2015  . Depression 06/17/2015  . New onset a-fib ([place]) 06/17/2015  . Chronic diastolic congestive heart failure ([place])   . Hereditary and idiopathic peripheral neuropathy 08/12/2010  . SPINAL STENOSIS, LUMBAR 08/12/2010  . UNSTEADY GAIT 08/12/2010  . TIA 08/12/2010  . Coronary artery disease 08/01/2015    65% stenosis mid LAD    Past Surgical History  Procedure Laterality Date  . Thumb tendon severed and repaired      right, tendon snapped  . Carpal tunnel release      Right  . Achilles tendon repair      left, repaired w/ wire  . Brain surgery      done by Dr Lizabeth Leyden for tinnitius, unsuccessful, only on right  . Inguinal herniorrhapy  years ago  right and then 2 on left  . Lumbar laminectomy      L3-L5 posterior laminectomy 2006  . Joint replacement      Bilateral total knee replacement  . Spine surgery    . Cardiac catheterization N/A 08/01/2015    Procedure: Right/Left Heart Cath and Coronary Angiography;  Surgeon: Burnell Blanks, MD;  Location: [facility];  Service: Cardiovascular;  Laterality: N/A;    Family History  Problem Relation Age of Onset  . Other Mother     CHF  . Ulcers Mother     Venous stasis  . Varicose Veins Mother   . Other Father     CHF  . Cancer Sister     unknown  . Hip fracture Maternal Grandmother   .  Pneumonia Maternal Grandmother     Social History   Social History  . Marital Status: Married    Spouse Name: N/A  . Number of Children: N/A  . Years of Education: N/A   Occupational History  . Retired    Social History Main Topics  . Smoking status: Former Smoker -- 3.00 packs/day    Quit date: 10/16/1960  . Smokeless tobacco: Never Used  . Alcohol Use: 8.4 oz/week    14 Cans of beer per week     Comment: 1-2 beer daily  . Drug Use: No  . Sexual Activity: Not on file   Other Topics Concern  . Not on file   Social History Narrative    Current Outpatient Prescriptions  Medication Sig Dispense Refill  . bisacodyl (DULCOLAX) 5 MG EC tablet Take 5 mg by mouth 2 (two) times daily.    . clopidogrel (PLAVIX) 75 MG tablet TAKE 1 TABLET BY MOUTH ATBEDTIME. 90 tablet 1  . fish oil-omega-3 fatty acids 1000 MG capsule Take 2 g by mouth at bedtime.     Marland Kitchen ibuprofen (ADVIL,MOTRIN) 200 MG tablet Take 200 mg by mouth daily as needed (pain).    . LORazepam (ATIVAN) 0.5 MG tablet Take 1 tablet (0.5 mg total) by mouth at bedtime as needed for anxiety. (Patient taking differently: Take 0.5 mg by mouth at bedtime as needed for sleep. ) 90 tablet 0  . Polyvinyl Alcohol-Povidone (REFRESH OP) Place 1 drop into both eyes daily as needed (itching/watery eyes).    . simvastatin (ZOCOR) 40 MG tablet TAKE 1 TABLET BY MOUTH DAILY AT 6 PM. (Patient taking differently: TAKE 1 TABLET BY MOUTH DAILY AT BEDTIME) 90 tablet 1   No current facility-administered medications for this visit.    Allergies  Allergen Reactions  . Niacin And Related Rash    Facial redness and Rash head to toe  . Omnipaque [Iohexol] Rash    Facial redness and flushing      Review of Systems:  General:decreased appetite, decreased energy, no weight gain, + 10 pound weight loss, no fever Cardiac:no chest pain with exertion, no chest pain at rest, + SOB with exertion,  no resting SOB, no PND, no orthopnea, no palpitations, + arrhythmia, + atrial fibrillation, + LE edema, no dizzy spells, no syncope Respiratory:+ shortness of breath, nop home oxygen, no productive cough, no dry cough, no bronchitis, no wheezing, no hemoptysis, no asthma, no pain with inspiration or cough, no sleep apnea, no CPAP at night GI:no difficulty swallowing, no reflux, no frequent heartburn, no hiatal hernia, no abdominal pain, + constipation, no diarrhea, no hematochezia, no hematemesis, no melena GU:no dysuria, no frequency, no urinary tract infection, no hematuria,  no enlarged prostate, no kidney stones, no kidney disease Vascular:no pain suggestive of claudication, + pain in feet, no leg cramps, no varicose veins, no DVT, no non-healing foot ulcer Neuro:+ stroke, + TIA's, no seizures, no headaches, no temporary blindness one eye, no slurred speech, + peripheral neuropathy, + chronic pain, + severe instability of gait, + significant memory/cognitive dysfunction Musculoskeletal:+ arthritis, no joint swelling, no myalgias, + difficulty walking, extremely limited mobility  Skin:no rash, no itching, no skin infections, no pressure sores or ulcerations Psych:no anxiety, no depression, no nervousness, no unusual recent stress Eyes:no blurry vision, no floaters, no recent vision changes, + wears glasses or contacts ENT:+ hearing loss, no loose or painful teeth, + dentures, last saw dentist September 2016 Hematologic:+ easy bruising, + abnormal bleeding, no clotting disorder, + frequent  epistaxis Endocrine:no diabetes, does not check CBG's at home           Physical Exam:   BP 83/38 mmHg  Pulse 54  Resp 20  Ht 6\' 4"  (1.93 m)  Wt 190 lb (86.183 kg)  BMI 23.14 kg/m2  SpO2 98%  General:  Elderly, frail gentleman in no distress  HEENT:  Unremarkable , NCAT, PERLA, EOMI, oropharynx clear  Neck:   no JVD, no bruits, no adenopathy or thyromegaly  Chest:   clear to auscultation, symmetrical breath sounds, no wheezes, no rhonchi   CV:   IRRR, grade IV/VI crescendo/decrescendo murmur heard best at LSB,  no diastolic murmur  Abdomen:  soft, non-tender, no masses or organomegaly  Extremities:  warm, well-perfused, pulses palpable in feet, mild bilateral LE edema  Rectal/GU  Deferred  Neuro:   Grossly non-focal and symmetrical throughout  Skin:   Clean and dry, no rashes, no breakdown   Diagnostic Tests:   [facility]*           [place], [place] [address]              [phone]  ------------------------------------------------------------------- Transthoracic Echocardiography  Patient:  Mirko, Tailor MR #:    [id number] Study Date: 06/06/2015 Gender:   M Age:    40 Height:   190.5 cm Weight:   91.6 kg BSA:    2.21 m^2 Pt. Status: Room:  ATTENDING  Serita Butcher A REFERRING  [place], [place], Outpatient  cc:  ------------------------------------------------------------------- LV EF: 50% -  55%  ------------------------------------------------------------------- Indications:   Aortic stenosis 424.1.  ------------------------------------------------------------------- History:  PMH:  Murmur. Transient ischemic attack. Risk factors:  Dyslipidemia.  ------------------------------------------------------------------- Study Conclusions  - Left ventricle: The cavity size was normal. Wall thickness was increased in a pattern of severe LVH. Systolic function was normal. The estimated ejection fraction was in the range of 50% to 55%. Wall motion was normal; there were no regional wall motion abnormalities. - Aortic valve: Cusp separation was moderately reduced. There was severe stenosis. Peak velocity (S): 404 cm/s. Mean gradient (S): 38 mm Hg. Valve area (VTI): 0.66 cm^2. Valve area (Vmax): 0.61 cm^2. Valve area (Vmean): 0.62 cm^2. - Mitral valve: Calcified annulus. Mildly thickened leaflets . - Left atrium: The atrium was mildly dilated. - Right ventricle: The cavity size was mildly dilated. Wall thickness was normal. - Right atrium: The atrium was mildly dilated. - Pulmonary arteries: Systolic pressure was mildly to moderately increased. PA peak pressure: 44 mm Hg (S). - Pericardium, extracardiac:  A small pericardial effusion was identified along the right atrial free wall. There was no evidence of hemodynamic compromise.  Impressions:  - Aortic stenosis has progressed when compared to prior echocardiogram.  Transthoracic echocardiography. M-mode, complete 2D, spectral Doppler, and color Doppler. Birthdate: Patient birthdate: [birth year]. Age: Patient is 81 yr old. Sex: Gender: male. BMI: 25.2 kg/m^2. Blood pressure:   103/66 Patient status: Outpatient. Study date: Study date: 06/06/2015. Study time: 10:14 AM. Location: Echo laboratory.  -------------------------------------------------------------------  ------------------------------------------------------------------- Left ventricle: The cavity size was normal. Wall thickness was increased in a pattern of severe LVH. Systolic function was normal. The estimated ejection fraction was in the range of 50% to 55%. Wall  motion was normal; there were no regional wall motion abnormalities.  ------------------------------------------------------------------- Aortic valve:  Trileaflet; mildly thickened leaflets. Cusp separation was moderately reduced. Doppler:  There was severe stenosis.  There was no regurgitation.  VTI ratio of LVOT to aortic valve: 0.15. Valve area (VTI): 0.66 cm^2. Indexed valve area (VTI): 0.3 cm^2/m^2. Peak velocity ratio of LVOT to aortic valve: 0.13. Valve area (Vmax): 0.61 cm^2. Indexed valve area (Vmax): 0.27 cm^2/m^2. Mean velocity ratio of LVOT to aortic valve: 0.14. Valve area (Vmean): 0.62 cm^2. Indexed valve area (Vmean): 0.28 cm^2/m^2.  Mean gradient (S): 38 mm Hg. Peak gradient (S): 65 mm Hg.  ------------------------------------------------------------------- Aorta: Aortic root: The aortic root was normal in size.  ------------------------------------------------------------------- Mitral valve:  Calcified annulus. Mildly thickened leaflets . Mobility was not restricted. Doppler: Transvalvular velocity was within the normal range. There was no evidence for stenosis. There was trivial regurgitation.  Peak gradient (D): 4 mm Hg.  ------------------------------------------------------------------- Left atrium: The atrium was mildly dilated.  ------------------------------------------------------------------- Right ventricle: The cavity size was mildly dilated. Wall thickness was normal. Systolic function was normal.  ------------------------------------------------------------------- Pulmonic valve:  Poorly visualized. Structurally normal valve. Cusp separation was normal. Doppler: Transvalvular velocity was within the normal range. There was no evidence for stenosis. There was no regurgitation.  ------------------------------------------------------------------- Tricuspid valve:  Structurally normal valve.  Doppler: Transvalvular velocity was  within the normal range. There was mild regurgitation.  ------------------------------------------------------------------- Pulmonary artery:  The main pulmonary artery was normal-sized. Systolic pressure was mildly to moderately increased.  ------------------------------------------------------------------- Right atrium: The atrium was mildly dilated.  ------------------------------------------------------------------- Pericardium: A small pericardial effusion was identified along the right atrial free wall. There was no evidence of hemodynamic compromise.  ------------------------------------------------------------------- Systemic veins: Inferior vena cava: The vessel was dilated. The respirophasic diameter changes were blunted (< 50%), consistent with elevated central venous pressure.  ------------------------------------------------------------------- Measurements  Left ventricle              Value     Reference LV ID, ED, PLAX chordal      (L)   39.2 mm    43 - 52 LV ID, ES, PLAX chordal          30  mm    23 - 38 LV fx shortening, PLAX chordal  (L)   23  %    >=29 LV PW thickness, ED            19.3 mm    --------- IVS/LV PW ratio, ED            0.82      <=1.3 Stroke volume, 2D             56  ml    --------- Stroke volume/bsa, 2D           25  ml/m^2  ---------  Ventricular septum            Value     Reference IVS thickness, ED             15.9 mm    ---------  LVOT                   Value     Reference LVOT ID, S                24  mm    --------- LVOT area                 4.52 cm^2   --------- LVOT peak velocity, S           54.1 cm/s   --------- LVOT mean velocity, S           38.5 cm/s   --------- LVOT VTI,  S                12.4 cm    ---------  Aortic valve               Value     Reference Aortic valve peak velocity, S       404  cm/s   --------- Aortic valve mean velocity, S       279  cm/s   --------- Aortic valve VTI, S            85.5 cm    --------- Aortic mean gradient, S          38  mm Hg  --------- Aortic peak gradient, S          65  mm Hg  --------- VTI ratio, LVOT/AV            0.15      --------- Aortic valve area, VTI          0.66 cm^2   --------- Aortic valve area/bsa, VTI        0.3  cm^2/m^2 --------- Velocity ratio, peak, LVOT/AV       0.13      --------- Aortic valve area, peak velocity     0.61 cm^2   --------- Aortic valve area/bsa, peak        0.27 cm^2/m^2 --------- velocity Velocity ratio, mean, LVOT/AV       0.14      --------- Aortic valve area, mean velocity     0.62 cm^2   --------- Aortic valve area/bsa, mean        0.28 cm^2/m^2 --------- velocity  Aorta                   Value     Reference Aortic root ID, ED            32  mm    ---------  Left atrium                Value     Reference LA ID, A-P, ES              46  mm    --------- LA ID/bsa, A-P              2.08 cm/m^2  <=2.2 LA volume, S               95  ml    --------- LA volume/bsa, S             43  ml/m^2  --------- LA  volume, ES, 1-p A4C          61  ml    --------- LA volume/bsa, ES, 1-p A4C        27.6 ml/m^2  --------- LA volume, ES, 1-p A2C          133  ml    --------- LA volume/bsa, ES, 1-p A2C        60.2 ml/m^2  ---------  Mitral valve               Value      Reference Mitral E-wave peak velocity        96.3 cm/s   --------- Mitral A-wave peak velocity        35  cm/s   --------- Mitral deceleration time         225  ms    150 - 230 Mitral peak gradient, D          4   mm Hg  --------- Mitral E/A ratio, peak          2.8      --------- Mitral regurg VTI, PISA          163  cm    ---------  Pulmonary arteries            Value     Reference PA pressure, S, DP        (H)   44  mm Hg  <=30  Tricuspid valve              Value     Reference Tricuspid regurg peak velocity      270  cm/s   --------- Tricuspid peak RV-RA gradient       29  mm Hg  ---------  Systemic veins              Value     Reference Estimated CVP               15  mm Hg  ---------  Right ventricle              Value     Reference RV pressure, S, DP        (H)   44  mm Hg  <=30  Legend: (L) and (H) mark values outside specified reference range.  ------------------------------------------------------------------- Prepared and Electronically Authenticated by  Candee Furbish, M.D. 2016-08-10T11:13:49    Congenital Measurements       Z-score normal range: +/-2   BSA formula: Arneta Cliche Documents:    There are no order-level documents.    Encounter-Level Documents - 06/06/2015:      Electronic signature on 06/06/2015 10:00 AM    Signed    Electronically signed by Jerline Pain, MD on 06/06/15 at 1114 EDT    Procedures    Right/Left Heart Cath and Coronary Angiography    Conclusion     Prox RCA lesion, 20% stenosed.  Mid RCA lesion, 20% stenosed.  Dist RCA lesion, 20% stenosed.  Post Atrio lesion, 20% stenosed.  Mid LAD to Dist LAD lesion, 65% stenosed.  1. Moderate, calcific stenosis in the mid LAD  that does not appear to be flow limiting.  2. Mild non-obstructive disease in the RCA.  3. Severe aortic valve stenosis (peak to peak gradient 31 mmHg, mean gradient 28.4 mmHg, AVA 0.92). The valve is calcified and was very difficult to cross suggesting severe stenosis.   Recommendations: His LAD stenosis does not  appear to be flow limiting and he is not having angina. I think we can manage the LAD stenosis medically. Will continue workup for TAVR. He has an appt to see Dr. Roxy Manns with CT surgery on 08/06/15. Will plan CT scans pending stability of renal function post cath.      Indications    Severe aortic valve stenosis [I35.0 (ICD-10-CM)]    Technique and Indications    Estimated blood loss <50 mL. Indication: Severe aortic valve stenosis  Procedure: The risks, benefits, complications, treatment options, and expected outcomes were discussed with the patient. The patient and/or family concurred with the proposed plan, giving informed consent. The patient was brought to the cath lab after IV hydration was begun and oral premedication was given. The patient was further sedated with Versed. The right ante-cubital area was prepped and draped but we could not advance a wire through the existing IV catheter in the right ante-cubital vein. The right groin was prepped and draped. A 7 Fr sheath was placed in the right femoral vein. A balloon tipped catheter was used to perform a right heart catheterization. The right wrist was assessed with a modified Allens test which was positive. The right wrist was prepped and draped in a sterile fashion. 1% lidocaine was used for local anesthesia. Using the modified Seldinger access technique, a 5 French sheath was placed in the right radial artery. 3 mg Verapamil was given through the sheath. 4300 units IV heparin was given. Standard diagnostic catheters were used to perform selective coronary angiography. I crossed the aortic valve with an AL-2 Catheter and a  straight wire. LV pressures measured. No LV gram performed. The sheath was removed from the right radial artery and a Terumo hemostasis band was applied at the arteriotomy site on the right wrist.   No complications.    Coronary Findings    Dominance: Right   Left Anterior Descending  The vessel is large .   Marland Kitchen Mid LAD to Dist LAD lesion, 65% stenosed. calcified .   Marland Kitchen First Diagonal Branch   The vessel is moderate in size.   Marland Kitchen Second Diagonal Branch   The vessel is small in size.   . Third Diagonal Branch   The vessel is small in size.     Ramus Intermedius  The vessel is moderate in size .     Left Circumflex  The vessel is moderate in size .   Marland Kitchen First Obtuse Marginal Branch   The vessel is small in size.   Marland Kitchen Second Obtuse Marginal Branch   The vessel is moderate in size.     Right Coronary Artery   . Prox RCA lesion, 20% stenosed. diffuse .   Marland Kitchen Mid RCA lesion, 20% stenosed. diffuse .   Marland Kitchen Dist RCA lesion, 20% stenosed. discrete .   Marland Kitchen Right Posterior Atrioventricular Branch   . Post Atrio lesion, 20% stenosed. discrete .       Right Heart Pressures Hemodynamic findings consistent with mild pulmonary hypertension. LV EDP is normal.    Coronary Diagrams    Diagnostic Diagram            Implants    Name ID Temporary Type Supply   No information to display    PACS Images    Show images for Cardiac catheterization     Link to Procedure Log    Procedure Log      Hemo Data       Most Recent Value  Fick Cardiac Output  5.37 L/min   Fick Cardiac Output Index  2.49 (L/min)/BSA   Aortic Mean Gradient  28.4 mmHg   Aortic Peak Gradient  31 mmHg   Aortic Valve Area  0.92   Aortic Value Area Index  0.43 cm2/BSA   RA A Wave  7 mmHg   RA V Wave  8 mmHg   RA Mean  7 mmHg   RV Systolic Pressure  36 mmHg   RV Diastolic Pressure  5 mmHg   RV EDP  8 mmHg   PA Systolic Pressure  40 mmHg   PA Diastolic Pressure  16 mmHg   PA Mean  26  mmHg   PW A Wave  25 mmHg   PW V Wave  35 mmHg   PW Mean  21 mmHg   AO Systolic Pressure  517 mmHg   AO Diastolic Pressure  61 mmHg   AO Mean  81 mmHg   LV Systolic Pressure  001 mmHg   LV Diastolic Pressure  7 mmHg   LV EDP  17 mmHg   Arterial Occlusion Pressure Extended Systolic Pressure  749 mmHg   Arterial Occlusion Pressure Extended Diastolic Pressure  73 mmHg   Arterial Occlusion Pressure Extended Mean Pressure  95 mmHg   Left Ventricular Apex Extended Systolic Pressure  449 mmHg   Left Ventricular Apex Extended Diastolic Pressure  5 mmHg   Left Ventricular Apex Extended EDP Pressure  15 mmHg   QP/QS  1   TPVR Index  10.46 HRUI   TSVR Index  32.6 HRUI   PVR SVR Ratio  0.07   TPVR/TSVR Ratio  0.32    Order-Level Documents:    There are no order-level documents.    Encounter-Level Documents - 07/16/15:      Scan on 08/03/2015 11:10 AM by Provider Default, MDScan on 08/03/2015 11:10 AM by Provider Default, MD     Scan on 08/03/2015 11:01 AM by Provider Default, MDScan on 08/03/2015 11:01 AM by Provider Default, MD     Scan on 08/01/2015 11:10 AM by Provider Default, MDScan on 08/01/2015 11:10 AM by Provider Default, MD     Electronic signature on 08/01/2015 8:09 AM    Signed    Electronically signed by Burnell Blanks, MD on 08/01/15 at 1118 EDT    ADDENDUM REPORT: 08/14/2015 19:44 CLINICAL DATA: Aortic stenosis EXAM: Cardiac TAVR CT TECHNIQUE: The patient was scanned on a Philips [id number] scanner. A 120 kV retrospective scan was triggered in the descending thoracic aorta at 111 HU's. Gantry rotation speed was 270 msecs and collimation was .9 mm. 5 mg of iv Metoprolol and no nitro were given. The 3D data set was reconstructed in 5% intervals of the R-R cycle. Systolic and diastolic phases were analyzed on a dedicated work station using MPR, MIP and VRT modes. The patient received 80 cc of contrast. FINDINGS: Aortic Valve: Severely  thickened and calcified aortic valve with severely restricted leaflet opening and moderate subvalvular (LVOT) calcifications predominantly under the right coronary cusp. Aorta: Normal caliber. Mild diffuse calcifications. No dissection. Sinotubular Junction: 29 x 29 mm Ascending Thoracic Aorta: 32 x 32 mm Aortic Arch: 30 x 30 mm Descending Thoracic Aorta: 26 x 26 mm Sinus of Valsalva Measurements: Non-coronary: 36 mm Right -coronary: 35 mm Left -coronary: 36 mm Coronary Artery Height above Annulus: Left Main: 17 mm Right Coronary: 14 mm Virtual Basal Annulus Measurements: Maximum/Minimum Diameter: 31 x 26 mm Perimeter: 108 mm Area: 644 mm2 Optimum  Fluoroscopic Angle for Delivery: LAO 1 CRA 0 IMPRESSION: 1. Severely thickened and calcified aortic valve with severely restricted leaflet opening and moderate subvalvular (LVOT) calcifications predominantly under the right coronary cusp. Annular measurement suitable for delivery of 29 mm Edward-SAPIEN 3 TAVR valve. 2. Sufficient annular to coronary distance. 3. Optimum Fluoroscopic Angle for Delivery: LAO 1 CRA 0 Ena Dawley Electronically Signed  By: Ena Dawley  On: 08/14/2015 19:44     Study Result     EXAM: OVER-READ INTERPRETATION CT CHEST  The following report is an over-read performed by radiologist Dr. Rebekah [facility] on 08/14/2015. This over-read does not include interpretation of cardiac or coronary anatomy or pathology. The coronary calcium score/coronary CTA interpretation by the cardiologist is attached.  COMPARISON: Chest CT 12/01/2006.  FINDINGS: Noncardiac findings will be comprehensively described on contemporaneously obtained CTA of the chest, abdomen and pelvis dated 08/13/2015.  IMPRESSION: Please see separate dictation for CTA of chest, abdomen and pelvis dated 08/13/2015 for full description of noncardiac findings.  Electronically  Signed: By: Vinnie Langton M.D. On: 08/14/2015 09:44       CLINICAL DATA: 81 year old male with history of severe aortic stenosis. Preprocedural study prior to potential transcatheter aortic valve replacement (TAVR).  EXAM: CT ANGIOGRAPHY CHEST, ABDOMEN AND PELVIS  TECHNIQUE: Multidetector CT imaging through the chest, abdomen and pelvis was performed using the standard protocol during bolus administration of intravenous contrast. Multiplanar reconstructed images and MIPs were obtained and reviewed to evaluate the vascular anatomy.  CONTRAST: 82mL OMNIPAQUE IOHEXOL 350 MG/ML SOLN  COMPARISON: CT the abdomen and pelvis 01/21/2012. Chest CT 12/01/2006.  FINDINGS: CTA CHEST FINDINGS  Mediastinum/Lymph Nodes: Heart size is mildly enlarged. There is no significant pericardial fluid, thickening or pericardial calcification. Tiny filling defect in the tip of the left atrial appendage, best appreciated on image 84 of series 401, suspicious for potential thrombus. No pathologically enlarged mediastinal or hilar lymph nodes. Coarsely calcified left hilar and AP window lymph nodes. Esophagus is unremarkable in appearance. No axillary lymphadenopathy.  Lungs/Pleura: Small right and trace left pleural effusions lying dependently. Patchy areas of peripheral predominant ground-glass attenuation in the right upper and lower lobes, with some associated septal thickening, particularly in the right upper lobe, favored to reflect areas of post infectious or inflammatory scarring. Minimal dependent subsegmental atelectasis in the lower lobes of the lungs bilaterally. No acute consolidative airspace disease. No suspicious appearing pulmonary nodules or masses. Small calcified granulomas in the left upper lobe.  Musculoskeletal/Soft Tissues: There are no aggressive appearing lytic or blastic lesions noted in the visualized portions of the skeleton.  CTA ABDOMEN AND PELVIS  FINDINGS  Hepatobiliary: Numerous small calcified granulomas throughout the liver. No definite suspicious cystic or solid hepatic lesions are identified. No intra or extrahepatic biliary ductal dilatation. Several small partially calcified gallstones lie dependently in the gallbladder. No findings to suggest an acute cholecystitis at this time.  Pancreas: No pancreatic mass. No pancreatic ductal dilatation. No pancreatic or peripancreatic fluid or inflammatory changes.  Spleen: Multiple calcified granulomas in the spleen.  Adrenals/Urinary Tract: Bilateral adrenal glands are normal in appearance. Mild atrophy of the kidneys bilaterally. No suspicious renal lesions. No hydroureteronephrosis. Urinary bladder is normal in appearance.  Stomach/Bowel: Normal appearance of the stomach. No pathologic dilatation of small bowel or colon.  Vascular/Lymphatic: Extensive atherosclerosis throughout the abdominal and pelvic vasculature, with vascular findings and measurements pertinent to potential TAVR procedure, as detailed below. In addition, there is a fusiform infrarenal abdominal aortic aneurysm which  measures up to 4.7 x 5.5 cm in diameter (image 199 of series 401), and spans a craniocaudal length of approximately 5.4 cm. There is also aneurysmal dilatation of the common iliac arteries bilaterally measuring up to 3.0 cm on the left and 2.8 cm on the right. Separate origins of the splenic artery and common hepatic artery directly from the aorta (i.e., no normal celiac axis). Left gastric extends off the common hepatic artery. Single left renal artery. Two right renal arteries (small accessory branch to the right lower pole). Superior mesenteric artery and inferior mesenteric artery are both widely patent. Inferior mesenteric artery origin is from the inferior aspect of the infrarenal abdominal aortic aneurysm sac. No lymphadenopathy noted in the abdomen  or pelvis.  Reproductive: Prostate gland and seminal vesicles are unremarkable in appearance.  Other: Postoperative changes in the left inguinal region presumably from prior left inguinal herniorrhaphy. Immediately superficial to this there is a 1.6 x 3.2 x 5.5 cm intermediate collection of material (image 232 of series 401), likely proteinaceous fluid. No significant volume of ascites. No pneumoperitoneum.  Musculoskeletal: Multiple lucencies in the femoral heads and necks bilaterally appear symmetric, and are an isolated finding, favored to reflect aggressive osteoporosis. Old compression fracture of L2 with approximately 25% loss of anterior vertebral body height, similar to prior study from 01/21/2012. Status post laminectomy from L3-L5. There are no aggressive appearing lytic or blastic lesions noted in the visualized portions of the skeleton.  VASCULAR MEASUREMENTS PERTINENT TO TAVR:  AORTA:  Minimal Aortic Diameter - 17 x 15 mm  Severity of Aortic Calcification - severe  RIGHT PELVIS:  Right Common Iliac Artery -  Minimal Diameter - 7.8 x 7.0 mm  Tortuosity - Mild  Calcification - Severe  Right External Iliac Artery -  Minimal Diameter - 9.5 x 9.5 mm  Tortuosity - Severe  Calcification - Mild  Right Common Femoral Artery -  Minimal Diameter - 10.9 x 8.2 mm  Tortuosity - Mild  Calcification - Moderate  LEFT PELVIS:  Left Common Iliac Artery -  Minimal Diameter - 12.3 x 10.8 mm  Tortuosity - Mild  Calcification - Severe  Left External Iliac Artery -  Minimal Diameter - 9.9 x 7.9 mm  Tortuosity - Severe  Calcification - Mild  Left Common Femoral Artery -  Minimal Diameter - 9.6 x 8.2 mm  Tortuosity - Mild  Calcification - Moderate  Review of the MIP images confirms the above findings.  IMPRESSION: 1. Vascular findings and measurements pertinent to potential T AVR procedure, as detailed above.  Although this patient does have suitable pelvic arterial access from a vessel size perspective, there is extreme tortuosity of the external iliac arteries bilaterally. 2. Tiny filling defect in the tip of the left atrial appendage suspicious for a small amount of thrombus. Correlation with transesophageal echocardiogram is suggested if not recently performed. 3. Severe atherosclerosis throughout the abdominal and pelvic vasculature, including fusiform infrarenal abdominal aortic aneurysm and bilateral common iliac artery aneurysms, as detailed above. 4. Small right and trace left pleural effusions layering dependently. Patchy areas of ground-glass attenuation and septal thickening in the right lung, favored to reflect areas of post infectious or inflammatory scarring. Strictly speaking, acute infection is not entirely excluded, and clinical correlation is suggested. 5. Additional incidental findings, as above.   Electronically Signed  By: Vinnie Langton M.D.  On: 08/14/2015 11:11   [facility] Pre-Surgical Assessment - 08/15/15 0001    5 Meter Walk Test- trial 1 14.7 sec  one trial only, >6 sec indicates slow speed   Comments Not indicated - pt at high fall risk per other testing   4 Stage Balance Test tolerated for:  0 sec.   4 Stage Balance Test Position 1   comment unable to stand statically without UE support   Comment unable to sit to stand without UE support   ADL/IADL Needs Assistance with: Bathing;Dressing;Meal prep;Finances;Yard work   ADL/IADL Therapist, sports Index Moderately frail   6 Minute Walk- Baseline yes   BP (mmHg) 92/50 mmHg   HR (bpm) 82   02 Sat (%RA) 99 %   Modified Borg Scale for Dyspnea 0- Nothing at all   Perceived Rate of Exertion (Borg) 6-   6 Minute Walk Post Test yes   BP (mmHg) 110/60 mmHg   HR (bpm) 99   02 Sat (%RA) 97 %   Modified Borg Scale for Dyspnea 2- Mild shortness of breath    Perceived Rate of Exertion (Borg) 16-   Aerobic Endurance Distance Walked 170   Endurance additional comments Pt amb with 2 wheeled RW with supervision and wheelchair follow for safety. Pt required seated rest break at 1:20 and 65' due to fatigue/SOB. Pt able to resume after 2+ minutes and amb. additional 105'. Pt reported the first walk was the longest he had walked in quite some time.                          PT Education - 08/15/15 1521    Education provided Yes   Education Details fall risk, continued use of rollator/transport chair, benefits of freqeunt short distance walking in the house for flexibility/strengthening/general mobility   Person(s) Educated Patient;Child(ren)   Methods Explanation   Comprehension Verbalized understanding                    Plan - 08/15/15 1522    Clinical Impression Statement Pt is an 81 yo male present for OP PT evaluation as he is being considered for possible TAVR surgery due to severe aortic stenosis. Pt presents with wife and daughter who are key historians as pt reports decreased memory following a recent dental procedure. Pt has a history of declining mobility over the past several years due to variety of health issues including possible TIA and peripheral neuropathy. Pt has been using rollator walker as primary device within household for a couple of years and has just recently began using a transport chair for community mobiity in the last 2 months due to increasing difficulty in walking. Pt requires assist from his wife for most household tasks and although they live in a one level townhome at time of eval, they report plans to move into [facility] (assisted living) next month. Pt presents with fair strength per MMT yet poor functional strength in his lower extremities as he is unable to arise from chair independently without heavy UE support and increased time. His ROM is WFL  except his knee extension is lacking about 20-30 degrees likely due to significant portions of his day spent in a seated position. Balance is poor and he is at high fall risk. His gait speed is slow and he demonstrates a forward flexed posture with heavy UE support on RW and poor foot clearance and heavy knee flexion in stance. His endurance is also poor as he can only ambulate 65-105' without required seated rest. Pt's daughter inquired about PT for patient while awaiting potential cardiac  surgery and with his upcoming transition into [facility] as PT is offered onsite. PT encouraged daughter to speak with cardiac surgeon's about this potential as pt may benefit from general strengthening, balance, and mobility training if medically stable to do so.   PT Frequency One time visit   Consulted and Agree with Plan of Care Patient;Family member/caregiver          G-Codes - Sep 13, 2015 1534    Functional Assessment Tool Used 170' with RW   Functional Limitation Mobility: Walking and moving around   Mobility: Walking and Moving Around Current Status [phone]) At least 60 percent but less than 80 percent impaired, limited or restricted   Mobility: Walking and Moving Around Goal Status [phone]) At least 60 percent but less than 80 percent impaired, limited or restricted   Mobility: Walking and Moving Around Discharge Status [phone]) At least 60 percent but less than 80 percent impaired, limited or restricted       Problem List Patient Active Problem List   Diagnosis Date Noted  . Chronic diastolic congestive heart failure ([place])   . Coronary artery disease 08/01/2015  . Aortic stenosis   . Aortic stenosis, severe 07/02/2015  . New onset a-fib ([place]) 06/17/2015  . Rectal bleeding 06/17/2015  . SOB (shortness of breath) 06/17/2015  . Depression 06/17/2015  . Medicare annual wellness visit, subsequent 07/31/2014  . Preventative health care  06/26/2013  . Peripheral edema 03/29/2013  . Bilateral groin pain 02/13/2013  . Insomnia 12/26/2012  . Skin lesion of right arm 09/14/2012  . Cerumen impaction 09/14/2012  . Right leg weakness 03/01/2012  . Hyperglycemia 01/04/2012  . Tinnitus of right ear 05/08/2011  . COLONIC POLYPS 08/12/2010  . Hyperlipidemia 08/12/2010  . Hereditary and idiopathic peripheral neuropathy 08/12/2010  . DECREASED HEARING, RIGHT EAR 08/12/2010  . TIA 08/12/2010  . Abdominal aortic aneurysm ([place]) 08/12/2010  . SPINAL STENOSIS, LUMBAR 08/12/2010  . UNSTEADY GAIT 08/12/2010  . CARDIAC MURMUR 08/12/2010  . MEASLES, HX OF 08/12/2010  . CHICKENPOX, HX OF 08/12/2010    Wells, PT 09/13/15, 3:37 PM  [facility] [address] [place], [place], [address] Phone: [phone] Fax: [phone]  Name: SAMANYU TINNELL MRN: [id number] Date of Birth: 1925/03/26      STS Risk Calculator  ProcedureAVR + CABG  Risk of Mortality6.1% Morbidity or Mortality27.9% Prolonged LOS16.6% Short LOS13.6% Permanent Stroke2.9% Prolonged Vent Support16.4% DSW Infection0.3% Renal Failure7.7% Reoperation12.3%   Impression:  Patient has stage D severe symptomatic aortic stenosis but denies any significant symptoms probably due to his very sedentary lifestyle and his dementia with short term memory loss. The patient's wife and daughter both note that he gets short of breath and fatigued with very mild physical activity although he rarely walks more than 10 ft at a  time. It is hard to tell if his sedentary lifestyle is mainly due to his age, dementia, neuropathy, generalized weakness and deconditioning or due to limitation from his aortic stenosis. I suspect that his overall functional status and quality of life are limited more by his other problems than by aortic stenosis. His physical therapy evaluation shows marked deconditioning and poor mobility that appears to be due to a combination of neuropathy, poor balance, weakness, and shortness of breath with fatigue that could be due to his aortic stenosis and the energy expended to ambulate. I have personally reviewed the patient's recent transthoracic echocardiogram and diagnostic cardiac catheterization. Echocardiogram confirms the presence of severe aortic stenosis with  severe thickening and restricted leaflet mobility involving all 3 leaflets of the aortic valve. Peak velocity across the aortic valve measured 4 m/s corresponding to a mean transvalvular gradient of 38 mmHg. Left ventricular systolic function is  preserved. Diagnostic cardiac catheterization confirmed the presence of severe aortic stenosis and also revealed the  presence of significant single-vessel coronary artery disease with a long segment 65% stenosis of the mid left anterior descending coronary artery. He denies any chest discomfort.   I have personally reviewed his CT scans and he appears to be a candidate for a 29 mm Sapien 3 valve via a transfemoral route, although the external iliac arteries are very tortuous.   The patient and his wife and daughter were counseled extensively as to the relative risks and benefits of all options for the treatment of severe aortic stenosis including long term medical therapy, conventional surgery for aortic valve replacement, and transcatheter aortic valve replacement.  He is not felt to be a candidate for open surgical AVR due to his advanced age with frailty, poor mobility and dementia with short-term memory  loss.   I reviewed the surgical procedure of TAVR with them including complications that might develop, including but not limited to risks of death, stroke, paravalvular leak, aortic dissection or other major vascular complications, aortic annulus rupture, device embolization, cardiac rupture or perforation, mitral regurgitation, acute myocardial infarction, arrhythmia, heart block or bradycardia requiring permanent pacemaker placement, congestive heart failure, respiratory failure, renal failure, pneumonia, infection, other late complications related to structural valve deterioration or migration, or other complications that might ultimately cause a temporary or permanent loss of functional independence or other long term morbidity.  I told the patient and his wife and daughter that the heart valve team would review his case and make a final decision on the best treatment plan for him. He and his wife are planning to move into [facility] right after Thanksgiving and would not want anything done until after the New Year.     Plan:  His case will be reviewed with the heart valve team to make a final decision on his candidacy for TAVR.    Gaye Pollack, MD 08/15/2015

## 2015-08-20 ENCOUNTER — Other Ambulatory Visit: Payer: Self-pay | Admitting: Family Medicine

## 2015-08-20 DIAGNOSIS — G4700 Insomnia, unspecified: Secondary | ICD-10-CM

## 2015-08-20 MED ORDER — LORAZEPAM 0.5 MG PO TABS: 0.5000 mg | ORAL_TABLET | Freq: Every evening | ORAL | Status: DC | PRN

## 2015-08-24 ENCOUNTER — Telehealth: Payer: Self-pay | Admitting: Family Medicine

## 2015-08-24 NOTE — Telephone Encounter (Signed)
[facility] - Client [facility]     Patient Name: KYRON SCHLITT Initial Comment Caller says her husband has been bleeding on and off and got cream Mupirocin onitment and it worked for a while. The last nosebleed has not quit since this AM   DOB: 11/06/1924      Nurse Assessment  Nurse: Mancel Bale, RN, [place] Date/Time (Eastern Time): 08/24/2015 3:05:11 PM  Confirm and document reason for call. If symptomatic, describe symptoms. ---Caller states that her husband's nose has been bleeding since this morning. The caller states that her husband has been using a cream Mupirocin on the nose, but it doesn't seem to be helping today. The nose is only bleeding from one side. The nose is not profusely bleeding, but just spotting.  Has the patient traveled out of the country within the last 30 days? ---No  Does the patient have any new or worsening symptoms? ---Yes  Will a triage be completed? ---Yes  Related visit to physician within the last 2 weeks? ---No  Does the PT have any chronic conditions? (i.e. diabetes, asthma, etc.) ---Yes  List chronic conditions. ---Heart Condition and Peripheral Neuropathy    Guidelines     Guideline Title Affirmed Question Affirmed Notes   Nosebleed Taking Coumadin (warfarin) or other strong blood thinner, or known bleeding disorder (e.g., thrombocytopenia)    Final Disposition User   See Physician within [place], RN, [facility]     Referrals   [facility]   [facility]   Disagree/Comply: Comply

## 2015-08-25 ENCOUNTER — Ambulatory Visit: Payer: Self-pay | Admitting: Family Medicine

## 2015-09-10 ENCOUNTER — Telehealth: Payer: Self-pay | Admitting: Family Medicine

## 2015-09-10 NOTE — Telephone Encounter (Signed)
Can use Afrin nasal spray to help stop bleed. Only use rarely -- no more than 2 days in a row. Recommend BP assessment -- they can check at home, here in office (office visit) or check at pharmacy (manually by pharmacist). Also recommend ENT assessment, especially if BP normal. Can set up if they are willing. Recommend running a humidifier in the bedroom at night.

## 2015-09-10 NOTE — Telephone Encounter (Signed)
Caller name: Arbie Cookey  Relationship to patient: Wife  Can be reached: [phone]  Pharmacy:  [facility] [address] [place] [phone] (Phone) [phone] (Fax)         Reason for call: Wife wants to know what she can do about patient recurrent nose bleeds

## 2015-09-10 NOTE — Telephone Encounter (Signed)
Called the patients wife Arbie Cookey back and did go over instructions by Raiford Noble PA-C.  She did verbally understand  At this time she will definitely try the afrin and humidifier states already has and  will certainly do so as instructed.  States BP is taken daily and is always low. They will hold on the ENT referral at this time will try new things first and see if works

## 2015-09-10 NOTE — Telephone Encounter (Signed)
Called the wife Arbie Cookey.  She stated Dr. Charlett Blake prescribed mipirocin ointment 2% and they do use ointment with a Qtip along with come cotton in nose to stop the bleeding and it does work.  Nose bleeding has been on and off for the past month.  The wife said Dr. Charlett Blake stated is most likely allergies.  The wife stated this morning was running out of his nose and due to the nose continuing to bleed even tough they are following her instructions just concerned if anything else she should do.

## 2015-10-02 ENCOUNTER — Encounter: Payer: Self-pay | Admitting: Family Medicine

## 2015-10-02 ENCOUNTER — Ambulatory Visit (INDEPENDENT_AMBULATORY_CARE_PROVIDER_SITE_OTHER): Payer: Medicare HMO | Admitting: Family Medicine

## 2015-10-02 VITALS — BP 106/55 | HR 61 | Temp 97.8°F | Wt 185.4 lb

## 2015-10-02 DIAGNOSIS — I350 Nonrheumatic aortic (valve) stenosis: Secondary | ICD-10-CM | POA: Diagnosis not present

## 2015-10-02 DIAGNOSIS — Z23 Encounter for immunization: Secondary | ICD-10-CM

## 2015-10-02 DIAGNOSIS — R739 Hyperglycemia, unspecified: Secondary | ICD-10-CM

## 2015-10-02 DIAGNOSIS — G609 Hereditary and idiopathic neuropathy, unspecified: Secondary | ICD-10-CM | POA: Diagnosis not present

## 2015-10-02 DIAGNOSIS — Z0000 Encounter for general adult medical examination without abnormal findings: Secondary | ICD-10-CM

## 2015-10-02 DIAGNOSIS — E785 Hyperlipidemia, unspecified: Secondary | ICD-10-CM | POA: Diagnosis not present

## 2015-10-02 NOTE — Patient Instructions (Signed)
If anxiety or depression worsens we could start a new medicine called Lexapro/Escitalopram  Preventive Care for Adults, Male A healthy lifestyle and preventive care can promote health and wellness. Preventive health guidelines for men include the following key practices:  A routine yearly physical is a good way to check with your health care provider about your health and preventative screening. It is a chance to share any concerns and updates on your health and to receive a thorough exam.  Visit your dentist for a routine exam and preventative care every 6 months. Brush your teeth twice a day and floss once a day. Good oral hygiene prevents tooth decay and gum disease.  The frequency of eye exams is based on your age, health, family medical history, use of contact lenses, and other factors. Follow your health care provider's recommendations for frequency of eye exams.  Eat a healthy diet. Foods such as vegetables, fruits, whole grains, low-fat dairy products, and lean protein foods contain the nutrients you need without too many calories. Decrease your intake of foods high in solid fats, added sugars, and salt. Eat the right amount of calories for you.Get information about a proper diet from your health care provider, if necessary.  Regular physical exercise is one of the most important things you can do for your health. Most adults should get at least 150 minutes of moderate-intensity exercise (any activity that increases your heart rate and causes you to sweat) each week. In addition, most adults need muscle-strengthening exercises on 2 or more days a week.  Maintain a healthy weight. The body mass index (BMI) is a screening tool to identify possible weight problems. It provides an estimate of body fat based on height and weight. Your health care provider can find your BMI and can help you achieve or maintain a healthy weight.For adults 20 years and older:  A BMI below 18.5 is considered  underweight.  A BMI of 18.5 to 24.9 is normal.  A BMI of 25 to 29.9 is considered overweight.  A BMI of 30 and above is considered obese.  Maintain normal blood lipids and cholesterol levels by exercising and minimizing your intake of saturated fat. Eat a balanced diet with plenty of fruit and vegetables. Blood tests for lipids and cholesterol should begin at age 75 and be repeated every 5 years. If your lipid or cholesterol levels are high, you are over 50, or you are at high risk for heart disease, you may need your cholesterol levels checked more frequently.Ongoing high lipid and cholesterol levels should be treated with medicines if diet and exercise are not working.  If you smoke, find out from your health care provider how to quit. If you do not use tobacco, do not start.  Lung cancer screening is recommended for adults aged 76-80 years who are at high risk for developing lung cancer because of a history of smoking. A yearly low-dose CT scan of the lungs is recommended for people who have at least a 30-pack-year history of smoking and are a current smoker or have quit within the past 15 years. A pack year of smoking is smoking an average of 1 pack of cigarettes a day for 1 year (for example: 1 pack a day for 30 years or 2 packs a day for 15 years). Yearly screening should continue until the smoker has stopped smoking for at least 15 years. Yearly screening should be stopped for people who develop a health problem that would prevent them from having  lung cancer treatment.  If you choose to drink alcohol, do not have more than 2 drinks per day. One drink is considered to be 12 ounces (355 mL) of beer, 5 ounces (148 mL) of wine, or 1.5 ounces (44 mL) of liquor.  Avoid use of street drugs. Do not share needles with anyone. Ask for help if you need support or instructions about stopping the use of drugs.  High blood pressure causes heart disease and increases the risk of stroke. Your blood  pressure should be checked at least every 1-2 years. Ongoing high blood pressure should be treated with medicines, if weight loss and exercise are not effective.  If you are 12-32 years old, ask your health care provider if you should take aspirin to prevent heart disease.  Diabetes screening is done by taking a blood sample to check your blood glucose level after you have not eaten for a certain period of time (fasting). If you are not overweight and you do not have risk factors for diabetes, you should be screened once every 3 years starting at age 31. If you are overweight or obese and you are 41-30 years of age, you should be screened for diabetes every year as part of your cardiovascular risk assessment.  Colorectal cancer can be detected and often prevented. Most routine colorectal cancer screening begins at the age of 16 and continues through age 30. However, your health care provider may recommend screening at an earlier age if you have risk factors for colon cancer. On a yearly basis, your health care provider may provide home test kits to check for hidden blood in the stool. Use of a small camera at the end of a tube to directly examine the colon (sigmoidoscopy or colonoscopy) can detect the earliest forms of colorectal cancer. Talk to your health care provider about this at age 26, when routine screening begins. Direct exam of the colon should be repeated every 5-10 years through age 67, unless early forms of precancerous polyps or small growths are found.  People who are at an increased risk for hepatitis B should be screened for this virus. You are considered at high risk for hepatitis B if:  You were born in a country where hepatitis B occurs often. Talk with your health care provider about which countries are considered high risk.  Your parents were born in a high-risk country and you have not received a shot to protect against hepatitis B (hepatitis B vaccine).  You have HIV or  AIDS.  You use needles to inject street drugs.  You live with, or have sex with, someone who has hepatitis B.  You are a man who has sex with other men (MSM).  You get hemodialysis treatment.  You take certain medicines for conditions such as cancer, organ transplantation, and autoimmune conditions.  Hepatitis C blood testing is recommended for all people born from [birth year] through 1965 and any individual with known risks for hepatitis C.  Practice safe sex. Use condoms and avoid high-risk sexual practices to reduce the spread of sexually transmitted infections (STIs). STIs include gonorrhea, chlamydia, syphilis, trichomonas, herpes, HPV, and human immunodeficiency virus (HIV). Herpes, HIV, and HPV are viral illnesses that have no cure. They can result in disability, cancer, and death.  If you are a man who has sex with other men, you should be screened at least once per year for:  HIV.  Urethral, rectal, and pharyngeal infection of gonorrhea, chlamydia, or both.  If you are  at risk of being infected with HIV, it is recommended that you take a prescription medicine daily to prevent HIV infection. This is called preexposure prophylaxis (PrEP). You are considered at risk if:  You are a man who has sex with other men (MSM) and have other risk factors.  You are a heterosexual man, are sexually active, and are at increased risk for HIV infection.  You take drugs by injection.  You are sexually active with a partner who has HIV.  Talk with your health care provider about whether you are at high risk of being infected with HIV. If you choose to begin PrEP, you should first be tested for HIV. You should then be tested every 3 months for as long as you are taking PrEP.  A one-time screening for abdominal aortic aneurysm (AAA) and surgical repair of large AAAs by ultrasound are recommended for men ages 33 to 26 years who are current or former smokers.  Healthy men should no longer receive  prostate-specific antigen (PSA) blood tests as part of routine cancer screening. Talk with your health care provider about prostate cancer screening.  Testicular cancer screening is not recommended for adult males who have no symptoms. Screening includes self-exam, a health care provider exam, and other screening tests. Consult with your health care provider about any symptoms you have or any concerns you have about testicular cancer.  Use sunscreen. Apply sunscreen liberally and repeatedly throughout the day. You should seek shade when your shadow is shorter than you. Protect yourself by wearing long sleeves, pants, a wide-brimmed hat, and sunglasses year round, whenever you are outdoors.  Once a month, do a whole-body skin exam, using a mirror to look at the skin on your back. Tell your health care provider about new moles, moles that have irregular borders, moles that are larger than a pencil eraser, or moles that have changed in shape or color.  Stay current with required vaccines (immunizations).  Influenza vaccine. All adults should be immunized every year.  Tetanus, diphtheria, and acellular pertussis (Td, Tdap) vaccine. An adult who has not previously received Tdap or who does not know his vaccine status should receive 1 dose of Tdap. This initial dose should be followed by tetanus and diphtheria toxoids (Td) booster doses every 10 years. Adults with an unknown or incomplete history of completing a 3-dose immunization series with Td-containing vaccines should begin or complete a primary immunization series including a Tdap dose. Adults should receive a Td booster every 10 years.  Varicella vaccine. An adult without evidence of immunity to varicella should receive 2 doses or a second dose if he has previously received 1 dose.  Human papillomavirus (HPV) vaccine. Males aged 11-21 years who have not received the vaccine previously should receive the 3-dose series. Males aged 22-26 years may be  immunized. Immunization is recommended through the age of 90 years for any male who has sex with males and did not get any or all doses earlier. Immunization is recommended for any person with an immunocompromised condition through the age of 26 years if he did not get any or all doses earlier. During the 3-dose series, the second dose should be obtained 4-8 weeks after the first dose. The third dose should be obtained 24 weeks after the first dose and 16 weeks after the second dose.  Zoster vaccine. One dose is recommended for adults aged 43 years or older unless certain conditions are present.  Measles, mumps, and rubella (MMR) vaccine. Adults born  before 1957 generally are considered immune to measles and mumps. Adults born in [birth year] or later should have 1 or more doses of MMR vaccine unless there is a contraindication to the vaccine or there is laboratory evidence of immunity to each of the three diseases. A routine second dose of MMR vaccine should be obtained at least 28 days after the first dose for students attending postsecondary schools, health care workers, or international travelers. People who received inactivated measles vaccine or an unknown type of measles vaccine during 1963-1967 should receive 2 doses of MMR vaccine. People who received inactivated mumps vaccine or an unknown type of mumps vaccine before 1979 and are at high risk for mumps infection should consider immunization with 2 doses of MMR vaccine. Unvaccinated health care workers born before [birth year] who lack laboratory evidence of measles, mumps, or rubella immunity or laboratory confirmation of disease should consider measles and mumps immunization with 2 doses of MMR vaccine or rubella immunization with 1 dose of MMR vaccine.  Pneumococcal 13-valent conjugate (PCV13) vaccine. When indicated, a person who is uncertain of his immunization history and has no record of immunization should receive the PCV13 vaccine. All adults 21 years of  age and older should receive this vaccine. An adult aged 96 years or older who has certain medical conditions and has not been previously immunized should receive 1 dose of PCV13 vaccine. This PCV13 should be followed with a dose of pneumococcal polysaccharide (PPSV23) vaccine. Adults who are at high risk for pneumococcal disease should obtain the PPSV23 vaccine at least 8 weeks after the dose of PCV13 vaccine. Adults older than 79 years of age who have normal immune system function should obtain the PPSV23 vaccine dose at least 1 year after the dose of PCV13 vaccine.  Pneumococcal polysaccharide (PPSV23) vaccine. When PCV13 is also indicated, PCV13 should be obtained first. All adults aged 43 years and older should be immunized. An adult younger than age 74 years who has certain medical conditions should be immunized. Any person who resides in a nursing home or long-term care facility should be immunized. An adult smoker should be immunized. People with an immunocompromised condition and certain other conditions should receive both PCV13 and PPSV23 vaccines. People with human immunodeficiency virus (HIV) infection should be immunized as soon as possible after diagnosis. Immunization during chemotherapy or radiation therapy should be avoided. Routine use of PPSV23 vaccine is not recommended for American Indians, [place]atives, or people younger than 65 years unless there are medical conditions that require PPSV23 vaccine. When indicated, people who have unknown immunization and have no record of immunization should receive PPSV23 vaccine. One-time revaccination 5 years after the first dose of PPSV23 is recommended for people aged 19-64 years who have chronic kidney failure, nephrotic syndrome, asplenia, or immunocompromised conditions. People who received 1-2 doses of PPSV23 before age 57 years should receive another dose of PPSV23 vaccine at age 61 years or later if at least 5 years have passed since the  previous dose. Doses of PPSV23 are not needed for people immunized with PPSV23 at or after age 35 years.  Meningococcal vaccine. Adults with asplenia or persistent complement component deficiencies should receive 2 doses of quadrivalent meningococcal conjugate (MenACWY-D) vaccine. The doses should be obtained at least 2 months apart. Microbiologists working with certain meningococcal bacteria, [employer] recruits, people at risk during an outbreak, and people who travel to or live in countries with a high rate of meningitis should be immunized. A Market researcher up  through age 78 years who is living in a residence hall should receive a dose if he did not receive a dose on or after his 16th birthday. Adults who have certain high-risk conditions should receive one or more doses of vaccine.  Hepatitis A vaccine. Adults who wish to be protected from this disease, have chronic liver disease, work with hepatitis A-infected animals, work in hepatitis A research labs, or travel to or work in countries with a high rate of hepatitis A should be immunized. Adults who were previously unvaccinated and who anticipate close contact with an international adoptee during the first 60 days after arrival in the [place] from a country with a high rate of hepatitis A should be immunized.  Hepatitis B vaccine. Adults should be immunized if they wish to be protected from this disease, are under age 35 years and have diabetes, have chronic liver disease, have had more than one sex partner in the past 6 months, may be exposed to blood or other infectious body fluids, are household contacts or sex partners of hepatitis B positive people, are clients or workers in certain care facilities, or travel to or work in countries with a high rate of hepatitis B.  Haemophilus influenzae type b (Hib) vaccine. A previously unvaccinated person with asplenia or sickle cell disease or having a scheduled splenectomy should receive 1  dose of Hib vaccine. Regardless of previous immunization, a recipient of a hematopoietic stem cell transplant should receive a 3-dose series 6-12 months after his successful transplant. Hib vaccine is not recommended for adults with HIV infection. Preventive Service / Frequency Ages 41 to 57  Blood pressure check.** / Every 3-5 years.  Lipid and cholesterol check.** / Every 5 years beginning at age 50.  Hepatitis C blood test.** / For any individual with known risks for hepatitis C.  Skin self-exam. / Monthly.  Influenza vaccine. / Every year.  Tetanus, diphtheria, and acellular pertussis (Tdap, Td) vaccine.** / Consult your health care provider. 1 dose of Td every 10 years.  Varicella vaccine.** / Consult your health care provider.  HPV vaccine. / 3 doses over 6 months, if 3 or younger.  Measles, mumps, rubella (MMR) vaccine.** / You need at least 1 dose of MMR if you were born in 1957 or later. You may also need a second dose.  Pneumococcal 13-valent conjugate (PCV13) vaccine.** / Consult your health care provider.  Pneumococcal polysaccharide (PPSV23) vaccine.** / 1 to 2 doses if you smoke cigarettes or if you have certain conditions.  Meningococcal vaccine.** / 1 dose if you are age 51 to 38 years and a Market researcher living in a residence hall, or have one of several medical conditions. You may also need additional booster doses.  Hepatitis A vaccine.** / Consult your health care provider.  Hepatitis B vaccine.** / Consult your health care provider.  Haemophilus influenzae type b (Hib) vaccine.** / Consult your health care provider. Ages 56 to 21  Blood pressure check.** / Every year.  Lipid and cholesterol check.** / Every 5 years beginning at age 45.  Lung cancer screening. / Every year if you are aged 31-80 years and have a 30-pack-year history of smoking and currently smoke or have quit within the past 15 years. Yearly screening is stopped once you have  quit smoking for at least 15 years or develop a health problem that would prevent you from having lung cancer treatment.  Fecal occult blood test (FOBT) of stool. / Every year beginning  at age 73 and continuing until age 84. You may not have to do this test if you get a colonoscopy every 10 years.  Flexible sigmoidoscopy** or colonoscopy.** / Every 5 years for a flexible sigmoidoscopy or every 10 years for a colonoscopy beginning at age 18 and continuing until age 80.  Hepatitis C blood test.** / For all people born from [birth year] through 1963 and any individual with known risks for hepatitis C.  Skin self-exam. / Monthly.  Influenza vaccine. / Every year.  Tetanus, diphtheria, and acellular pertussis (Tdap/Td) vaccine.** / Consult your health care provider. 1 dose of Td every 10 years.  Varicella vaccine.** / Consult your health care provider.  Zoster vaccine.** / 1 dose for adults aged 10 years or older.  Measles, mumps, rubella (MMR) vaccine.** / You need at least 1 dose of MMR if you were born in 1957 or later. You may also need a second dose.  Pneumococcal 13-valent conjugate (PCV13) vaccine.** / Consult your health care provider.  Pneumococcal polysaccharide (PPSV23) vaccine.** / 1 to 2 doses if you smoke cigarettes or if you have certain conditions.  Meningococcal vaccine.** / Consult your health care provider.  Hepatitis A vaccine.** / Consult your health care provider.  Hepatitis B vaccine.** / Consult your health care provider.  Haemophilus influenzae type b (Hib) vaccine.** / Consult your health care provider. Ages 32 and over  Blood pressure check.** / Every year.  Lipid and cholesterol check.**/ Every 5 years beginning at age 27.  Lung cancer screening. / Every year if you are aged 66-80 years and have a 30-pack-year history of smoking and currently smoke or have quit within the past 15 years. Yearly screening is stopped once you have quit smoking for at least 15 years or  develop a health problem that would prevent you from having lung cancer treatment.  Fecal occult blood test (FOBT) of stool. / Every year beginning at age 6 and continuing until age 82. You may not have to do this test if you get a colonoscopy every 10 years.  Flexible sigmoidoscopy** or colonoscopy.** / Every 5 years for a flexible sigmoidoscopy or every 10 years for a colonoscopy beginning at age 38 and continuing until age 52.  Hepatitis C blood test.** / For all people born from [birth year] through 1963 and any individual with known risks for hepatitis C.  Abdominal aortic aneurysm (AAA) screening.** / A one-time screening for ages 19 to 77 years who are current or former smokers.  Skin self-exam. / Monthly.  Influenza vaccine. / Every year.  Tetanus, diphtheria, and acellular pertussis (Tdap/Td) vaccine.** / 1 dose of Td every 10 years.  Varicella vaccine.** / Consult your health care provider.  Zoster vaccine.** / 1 dose for adults aged 74 years or older.  Pneumococcal 13-valent conjugate (PCV13) vaccine.** / 1 dose for all adults aged 80 years and older.  Pneumococcal polysaccharide (PPSV23) vaccine.** / 1 dose for all adults aged 66 years and older.  Meningococcal vaccine.** / Consult your health care provider.  Hepatitis A vaccine.** / Consult your health care provider.  Hepatitis B vaccine.** / Consult your health care provider.  Haemophilus influenzae type b (Hib) vaccine.** / Consult your health care provider. **Family history and personal history of risk and conditions may change your health care provider's recommendations.   This information is not intended to replace advice given to you by your health care provider. Make sure you discuss any questions you have with your health care provider.  Document Released: 12/09/2001 Document Revised: 11/03/2014 Document Reviewed: 03/10/2011 Elsevier Interactive Patient Education [employer].

## 2015-10-02 NOTE — Progress Notes (Signed)
Pre visit review using our clinic review tool, if applicable. No additional management support is needed unless otherwise documented below in the visit note. 

## 2015-10-06 ENCOUNTER — Encounter: Payer: Self-pay | Admitting: Family Medicine

## 2015-10-06 ENCOUNTER — Ambulatory Visit (INDEPENDENT_AMBULATORY_CARE_PROVIDER_SITE_OTHER): Payer: Medicare HMO | Admitting: Family Medicine

## 2015-10-06 VITALS — BP 94/60 | HR 69 | Temp 97.2°F | Wt 182.0 lb

## 2015-10-06 DIAGNOSIS — Z23 Encounter for immunization: Secondary | ICD-10-CM

## 2015-10-06 DIAGNOSIS — R040 Epistaxis: Secondary | ICD-10-CM

## 2015-10-06 DIAGNOSIS — R197 Diarrhea, unspecified: Secondary | ICD-10-CM

## 2015-10-06 NOTE — Assessment & Plan Note (Addendum)
Suspect this is related to infectious cause. Doubt food borne illness given that it has persisted past 24 hours. He has moist mucous membranes and is not tachycardic. His blood pressure is low normal, though it appears to be in this range most the time in the past. His weight is down mildly over the last 4 days which could indicate some measure of volume depletion. I discussed the importance of staying well-hydrated. I discussed the importance of monitoring his weights given that he does have heart failure. He is advised to inform us if he gains more than 3 pounds in one night. He is advised to monitor for swelling in any issues breathing. Advised not to use Imodium. Overall he states he is improving and they will continue to monitor this. He will have the nurse at his nursing facility check his blood pressure. He is given parameters for his blood pressure. Suspect the minimal bloody yet on wiping was likely related rectal irritation or his known hemorrhoids. He had a negative FOBT in the office. They will continue to monitor this. If it recurs they will let us know. He is advised that if he has gross blood from his rectum they will seek medical attention. He is given return precautions.

## 2015-10-06 NOTE — Progress Notes (Signed)
Subjective:    Patient ID: Shawn Cortez, male    DOB: 27-Feb-1925, 81 y.o.   MRN: [id number]  Chief Complaint  Patient presents with  . Medicare Wellness    HPI Patient is in today for annual wellness exam and follow-up on numerous medical conditions. He is accompanied by his wife and son. [facility]'ve [facility] and are settled in nicely. They decided not to proceed with his cardiac surgery due to the high risk involved. He feels well today. No recent illness. Continues to struggle with peripheral neuropathy but has more numbness than pain. With his wife's assistance they manage his activities of daily living fairly well. He is essentially wheelchair-bound at this time. Denies CP/palp/SOB/HA/congestion/fevers/GI or GU c/o. Taking meds as prescribed  Past Medical History  Diagnosis Date  . Arthritis   . SOB (shortness of breath) on exertion   . Hyperlipidemia   . Stroke [facility])     TIA history 2005  . Hyperglycemia 01/04/2012  . Leg pain, right 03/01/2012  . Right leg weakness 03/01/2012  . Skin lesion of right arm 09/14/2012  . Abrasion of hand 09/14/2012  . Cerumen impaction 09/14/2012  . Unsteady gait 12/26/2012  . Insomnia 12/26/2012  . Bilateral groin pain 02/13/2013    L>R  . Peripheral edema 03/29/2013  . Preventative health care 06/26/2013  . AAA (abdominal aortic aneurysm) ([place])   . Atrial fibrillation ([place])   . Aortic stenosis, severe 07/02/2015  . Depression 06/17/2015  . New onset a-fib ([place]) 06/17/2015  . Chronic diastolic congestive heart failure ([place])   . Hereditary and idiopathic peripheral neuropathy 08/12/2010  . SPINAL STENOSIS, LUMBAR 08/12/2010  . UNSTEADY GAIT 08/12/2010  . TIA 08/12/2010  . Coronary artery disease 08/01/2015    65% stenosis mid LAD    Past Surgical History  Procedure Laterality Date  . Thumb tendon severed and repaired      right, tendon snapped  . Carpal tunnel release      Right  . Achilles tendon repair     left, repaired w/ wire  . Brain surgery      done by Dr Lizabeth Leyden for tinnitius, unsuccessful, only on right  . Inguinal herniorrhapy  years ago    right and then 2 on left  . Lumbar laminectomy      L3-L5 posterior laminectomy 2006  . Joint replacement      Bilateral total knee replacement  . Spine surgery    . Cardiac catheterization N/A 08/01/2015    Procedure: Right/Left Heart Cath and Coronary Angiography;  Surgeon: Burnell Blanks, MD;  Location: [facility];  Service: Cardiovascular;  Laterality: N/A;    Family History  Problem Relation Age of Onset  . Other Mother     CHF  . Ulcers Mother     Venous stasis  . Varicose Veins Mother   . Other Father     CHF  . Cancer Sister     unknown  . Hip fracture Maternal Grandmother   . Pneumonia Maternal Grandmother     Social History   Social History  . Marital Status: Married    Spouse Name: N/A  . Number of Children: N/A  . Years of Education: N/A   Occupational History  . Retired    Social History Main Topics  . Smoking status: Former Smoker -- 3.00 packs/day    Quit date: 10/16/1960  . Smokeless tobacco: Never Used  . Alcohol Use: 8.4  oz/week    14 Cans of beer per week     Comment: 1-2 beer daily  . Drug Use: No  . Sexual Activity: Not on file   Other Topics Concern  . Not on file   Social History Narrative    Outpatient Prescriptions Prior to Visit  Medication Sig Dispense Refill  . bisacodyl (DULCOLAX) 5 MG EC tablet Take 5 mg by mouth 2 (two) times daily.    . clopidogrel (PLAVIX) 75 MG tablet TAKE 1 TABLET BY MOUTH ATBEDTIME. 90 tablet 1  . fish oil-omega-3 fatty acids 1000 MG capsule Take 2 g by mouth at bedtime.     Marland Kitchen ibuprofen (ADVIL,MOTRIN) 200 MG tablet Take 200 mg by mouth daily as needed (pain).    . LORazepam (ATIVAN) 0.5 MG tablet Take 1 tablet (0.5 mg total) by mouth at bedtime as needed for sleep. 90 tablet 1  . Polyvinyl Alcohol-Povidone (REFRESH OP) Place 1 drop into both  eyes daily as needed (itching/watery eyes).    . simvastatin (ZOCOR) 40 MG tablet TAKE 1 TABLET BY MOUTH DAILY AT 6 PM. (Patient taking differently: TAKE 1 TABLET BY MOUTH DAILY AT BEDTIME) 90 tablet 1   No facility-administered medications prior to visit.    Allergies  Allergen Reactions  . Niacin And Related Rash    Facial redness and Rash head to toe  . Omnipaque [Iohexol] Rash    Facial redness and flushing    Review of Systems  Constitutional: Positive for malaise/fatigue. Negative for fever and chills.  HENT: Negative for congestion and hearing loss.   Eyes: Negative for discharge.  Respiratory: Negative for cough, sputum production and shortness of breath.   Cardiovascular: Negative for chest pain, palpitations and leg swelling.  Gastrointestinal: Negative for heartburn, nausea, vomiting, abdominal pain, diarrhea, constipation and blood in stool.  Genitourinary: Negative for dysuria, urgency, frequency and hematuria.  Musculoskeletal: Positive for joint pain. Negative for myalgias, back pain and falls.  Skin: Negative for rash.  Neurological: Positive for sensory change and weakness. Negative for dizziness, loss of consciousness and headaches.  Endo/Heme/Allergies: Negative for environmental allergies. Does not bruise/bleed easily.  Psychiatric/Behavioral: Negative for depression and suicidal ideas. The patient is not nervous/anxious and does not have insomnia.        Objective:    Physical Exam  Constitutional: He is oriented to person, place, and time. He appears well-developed and well-nourished. No distress.  HENT:  Head: Normocephalic and atraumatic.  Eyes: Conjunctivae are normal.  Neck: Neck supple. No thyromegaly present.  Cardiovascular: Normal rate and regular rhythm.   Murmur heard. irregular  Pulmonary/Chest: Effort normal. No respiratory distress. He has no wheezes. He has no rales.  Abdominal: Soft. Bowel sounds are normal. He exhibits no mass. There is  no tenderness.  Musculoskeletal: He exhibits no edema.  Lymphadenopathy:    He has no cervical adenopathy.  Neurological: He is alert and oriented to person, place, and time.  Skin: Skin is warm and dry.  Psychiatric: He has a normal mood and affect. His behavior is normal.    BP 106/55 mmHg  Pulse 61  Temp(Src) 97.8 F (36.6 C) (Oral)  Wt 185 lb 6 oz (84.086 kg)  SpO2 100% Wt Readings from Last 3 Encounters:  10/06/15 182 lb (82.555 kg)  10/02/15 185 lb 6 oz (84.086 kg)  08/15/15 190 lb (86.183 kg)     Lab Results  Component Value Date   WBC 5.6 07/30/2015   HGB 12.3* 07/30/2015  HCT 37.3* 07/30/2015   PLT 149.0* 07/30/2015   GLUCOSE 93 07/30/2015   CHOL 115 01/29/2015   TRIG 72.0 01/29/2015   HDL 44.10 01/29/2015   LDLCALC 57 01/29/2015   ALT 18 06/04/2015   AST 22 06/04/2015   NA 138 07/30/2015   K 4.2 07/30/2015   CL 102 07/30/2015   CREATININE 0.98 07/30/2015   BUN 23 07/30/2015   CO2 28 07/30/2015   TSH 1.90 06/04/2015   INR 1.1* 07/30/2015   HGBA1C 6.1 06/04/2015    Lab Results  Component Value Date   TSH 1.90 06/04/2015   Lab Results  Component Value Date   WBC 5.6 07/30/2015   HGB 12.3* 07/30/2015   HCT 37.3* 07/30/2015   MCV 92.5 07/30/2015   PLT 149.0* 07/30/2015   Lab Results  Component Value Date   NA 138 07/30/2015   K 4.2 07/30/2015   CO2 28 07/30/2015   GLUCOSE 93 07/30/2015   BUN 23 07/30/2015   CREATININE 0.98 07/30/2015   BILITOT 1.1 06/04/2015   ALKPHOS 67 06/04/2015   AST 22 06/04/2015   ALT 18 06/04/2015   PROT 7.2 06/04/2015   ALBUMIN 4.0 06/04/2015   CALCIUM 8.9 07/30/2015   GFR 76.67 07/30/2015   Lab Results  Component Value Date   CHOL 115 01/29/2015   Lab Results  Component Value Date   HDL 44.10 01/29/2015   Lab Results  Component Value Date   LDLCALC 57 01/29/2015   Lab Results  Component Value Date   TRIG 72.0 01/29/2015   Lab Results  Component Value Date   CHOLHDL 3 01/29/2015   Lab Results   Component Value Date   HGBA1C 6.1 06/04/2015       Assessment & Plan:   Problem List Items Addressed This Visit    Aortic stenosis, severe    After work up the patient and his family has decided not to due to the great risk.       Hereditary and idiopathic peripheral neuropathy (Chronic)    Persistent but tolerable numbness dominates pain. No change in meds for now.      Hyperglycemia    minimize simple carbs. Increase exercise as tolerated.       Hyperlipidemia (Chronic)    Encouraged heart healthy diet, increase exercise, avoid trans fats, consider a krill oil cap daily      Medicare annual wellness visit, subsequent    Patient denies any difficulties at home. No trouble with ADLs, depression or falls. See EMR for functional status screen and depression screen. No recent changes to vision or hearing. Is UTD with immunizations. Is UTD with screening. Discussed Advanced Directives. Encouraged heart healthy diet, exercise as tolerated and adequate sleep. See patient's problem list for health risk factors to monitor. See AVS for preventative healthcare recommendation schedule. Sees Dr Doug Sou office for dermatology Follows with opthamoloby, Dr Ward Chatters and Dr Cyd Silence with [facility]       Other Visit Diagnoses    Need for vaccination with 13-polyvalent pneumococcal conjugate vaccine    -  Primary    Relevant Orders    Pneumococcal conjugate vaccine 13-valent (Completed)       I am having Mr. Loken maintain his fish oil-omega-3 fatty acids, clopidogrel, simvastatin, bisacodyl, Polyvinyl Alcohol-Povidone (REFRESH OP), ibuprofen, and LORazepam.  No orders of the defined types were placed in this encounter.     Penni Homans, MD

## 2015-10-06 NOTE — Assessment & Plan Note (Signed)
Persistent but tolerable numbness dominates pain. No change in meds for now.

## 2015-10-06 NOTE — Assessment & Plan Note (Signed)
minimize simple carbs. Increase exercise as tolerated.  

## 2015-10-06 NOTE — Patient Instructions (Addendum)
Nice to meet you. Her diarrhea is likely related to a virus. Please continue to monitor this. I'm glad it's improving. If you develop any persistent diarrhea or blood in your stool or abdominal pain you need to be evaluated immediately. It is important that you stay well hydrated. You need to monitor your fluid status and if you gain more than 3 pounds in a day or develop increased swelling or shortness of breath need to seek medical attention Refer you to ENT for your nosebleeds. If you develop lightheadedness, palpitations, shortness of breath, or any new or changing symptoms please seek medical attention. Please check your blood pressure at home and if it is less than 90/60 please let us know.

## 2015-10-06 NOTE — Assessment & Plan Note (Signed)
Frequent minimal nosebleeds. Asymptomatic. Easily stops on their own. Vital signs are stable. Discussed nasal saline and humidifier. Advised since they are persistent and they've tried multiple interventions so far that referral to ENT could be beneficial. I will place this referral for the patient. Advised if he were to bleed for more than 30 minutes they should let us know. He is given return precautions.

## 2015-10-06 NOTE — Progress Notes (Signed)
Pre visit review using our clinic review tool, if applicable. No additional management support is needed unless otherwise documented below in the visit note. 

## 2015-10-06 NOTE — Assessment & Plan Note (Signed)
After work up the patient and his family has decided not to due to the great risk.

## 2015-10-06 NOTE — Progress Notes (Signed)
Tommi Rumps, MD Phone: [phone]  Shawn Cortez is a 81 y.o. male who presents today for same-day visit.  Diarrhea: Patient notes Thursday evening he ate in the dining hall at his nursing facility. After eating he developed sensation of having to have a bowel movement. He then began having diarrhea. He notes his stool had been watery Thursday evening and yesterday. He has had lots of gas as well. No abdominal pain. He is getting up frequently for bowel movements. He had no blood in his bowel movements, though he noted minimal blood when he wiped his rectum. He states he has a history of hemorrhoids. He has had not had any melena or bright red blood per rectum. He notes that they were advised by the nursing facility that there is a stomach virus going around. He notes he is feeling better this morning and his bowel movements were almost normal this morning. Has been staying well-hydrated. He is not had any lightheadedness or palpitations. He is not having fevers.  Nosebleeds: Patient reports frequent minimal bleeding from his nose. Notes it is from the right nostril. Notes it very slowly bleeds. Sometimes it can be momentarily and sometimes it can be for longer periods of time. He pinches it and it improves. He has been seen by his PCP for this previously and was given some kind of cream to place in his nose and was advised by some on-call provider use Afrin. States this issue has been going on for 3-4 months. Denies palpitations, lightheadedness, dizziness, and shortness of breath.  PMH: Former smoker.   ROS see history of present illness  Objective  Physical Exam Filed Vitals:   10/06/15 1047  BP: 94/60  Pulse: 69  Temp: 97.2 F (36.2 C)    BP Readings from Last 3 Encounters:  10/06/15 94/60  10/02/15 106/55  08/15/15 83/38   Wt Readings from Last 3 Encounters:  10/06/15 182 lb (82.555 kg)  10/02/15 185 lb 6 oz (84.086 kg)  08/15/15 190 lb (86.183 kg)    Physical  Exam  Constitutional:  No acute distress, not diaphoretic  HENT:  Head: Normocephalic and atraumatic.  Right Ear: External ear normal.  Mouth/Throat: Oropharynx is clear and moist. No oropharyngeal exudate.  Right nostril with minimal dried crusted blood noted  Eyes: Conjunctivae are normal.  Neck: Neck supple.  Cardiovascular: Normal rate and regular rhythm.   Pulmonary/Chest: Effort normal and breath sounds normal.  Abdominal: Soft. He exhibits no distension. There is no tenderness. There is no rebound and no guarding.  Patient declined to get on the table for the abdominal exam, this is performed with him sitting in a chair  Genitourinary: Rectum normal. Guaiac negative stool.  Musculoskeletal: He exhibits no edema.  Lymphadenopathy:    He has no cervical adenopathy.     Assessment/Plan: Please see individual problem list.  Diarrhea Suspect this is related to infectious cause. Doubt food borne illness given that it has persisted past 24 hours. He has moist mucous membranes and is not tachycardic. His blood pressure is low normal, though it appears to be in this range most the time in the past. His weight is down mildly over the last 4 days which could indicate some measure of volume depletion. I discussed the importance of staying well-hydrated. I discussed the importance of monitoring his weights given that he does have heart failure. He is advised to inform us if he gains more than 3 pounds in one night. He is advised to  monitor for swelling in any issues breathing. Advised not to use Imodium. Overall he states he is improving and they will continue to monitor this. He will have the nurse at his nursing facility check his blood pressure. He is given parameters for his blood pressure. Suspect the minimal bloody yet on wiping was likely related rectal irritation or his known hemorrhoids. He had a negative FOBT in the office. They will continue to monitor this. If it recurs they will let us  know. He is advised that if he has gross blood from his rectum they will seek medical attention. He is given return precautions.  Frequent nosebleeds Frequent minimal nosebleeds. Asymptomatic. Easily stops on their own. Vital signs are stable. Discussed nasal saline and humidifier. Advised since they are persistent and they've tried multiple interventions so far that referral to ENT could be beneficial. I will place this referral for the patient. Advised if he were to bleed for more than 30 minutes they should let us know. He is given return precautions.    Orders Placed This Encounter  Procedures  . Flu Vaccine QUAD 36+ mos PF IM (Fluarix & Fluzone Quad PF)  . Ambulatory referral to ENT    Referral Priority:  Routine    Referral Type:  Consultation    Referral Reason:  Specialty Services Required    Requested Specialty:  Otolaryngology    Number of Visits Requested:  1    Dragon voice recognition software was used during the dictation process of this note. If any phrases or words seem inappropriate it is likely secondary to the translation process being inefficient.  Tommi Rumps

## 2015-10-06 NOTE — Assessment & Plan Note (Signed)
Encouraged heart healthy diet, increase exercise, avoid trans fats, consider a krill oil cap daily 

## 2015-10-06 NOTE — Assessment & Plan Note (Signed)
Patient denies any difficulties at home. No trouble with ADLs, depression or falls. See EMR for functional status screen and depression screen. No recent changes to vision or hearing. Is UTD with immunizations. Is UTD with screening. Discussed Advanced Directives. Encouraged heart healthy diet, exercise as tolerated and adequate sleep. See patient's problem list for health risk factors to monitor. See AVS for preventative healthcare recommendation schedule. Sees Dr Doug Sou office for dermatology Follows with opthamoloby, Dr Ward Chatters and Dr Cyd Silence with [facility]

## 2015-10-08 ENCOUNTER — Ambulatory Visit: Payer: Medicare HMO | Admitting: Thoracic Surgery (Cardiothoracic Vascular Surgery)

## 2015-10-17 ENCOUNTER — Ambulatory Visit (HOSPITAL_BASED_OUTPATIENT_CLINIC_OR_DEPARTMENT_OTHER)
Admission: RE | Admit: 2015-10-17 | Discharge: 2015-10-17 | Disposition: A | Discharge status: Home / Self Care | Payer: Medicare HMO | Source: Ambulatory Visit | Attending: Medical | Admitting: Medical

## 2015-10-17 ENCOUNTER — Other Ambulatory Visit: Payer: Self-pay | Admitting: Medical

## 2015-10-17 ENCOUNTER — Ambulatory Visit (INDEPENDENT_AMBULATORY_CARE_PROVIDER_SITE_OTHER): Payer: Medicare HMO | Admitting: Medical

## 2015-10-17 ENCOUNTER — Encounter: Payer: Self-pay | Admitting: Medical

## 2015-10-17 VITALS — BP 98/65 | HR 55 | Temp 97.0°F | Ht 76.0 in | Wt 191.8 lb

## 2015-10-17 DIAGNOSIS — M25521 Pain in right elbow: Secondary | ICD-10-CM | POA: Insufficient documentation

## 2015-10-17 DIAGNOSIS — M25551 Pain in right hip: Secondary | ICD-10-CM | POA: Diagnosis not present

## 2015-10-17 DIAGNOSIS — W19XXXA Unspecified fall, initial encounter: Secondary | ICD-10-CM | POA: Diagnosis not present

## 2015-10-17 DIAGNOSIS — R351 Nocturia: Secondary | ICD-10-CM | POA: Diagnosis not present

## 2015-10-17 DIAGNOSIS — R42 Dizziness and giddiness: Secondary | ICD-10-CM

## 2015-10-17 NOTE — Patient Instructions (Addendum)
For time being stop ativan. May have had effect on your balance.  Advise caution on changing position so as not to fall.   Get labs today assess if dehydrated, assess if anemic and check electrolytes.  Assess ua and psa to see if maybe recent early uti?? Or prostatitis. Please turn urine in tomorrow am. May get culture.  Hydrate well. Recommend propel.  If any recurrent unsteadiness, any falls or new symptoms then ED evaluation.  Get xray of hip and rt elbow today.  Follow up Friday by phone. Update [place]. Appointment wed dr. Charlett Blake or myself. If over weekend worsen then ED eval  Pt presented toward late afternoon. Hours(approximate 9 hrs) after incident based on interview. He appeared stable and thus did not think ED evaluation was indicated at that point.

## 2015-10-17 NOTE — Progress Notes (Signed)
Pre visit review using our clinic review tool, if applicable. No additional management support is needed unless otherwise documented below in the visit note. 

## 2015-10-17 NOTE — Progress Notes (Signed)
Subjective:    Patient ID: Shawn Cortez, male    DOB: Mar 03, 1925, 81 y.o.   MRN: [id number]  HPI  Pt had fall today. He lost his balance. He was using his walker around 7am. He was going to use restroom in his house. Family speculate he was in process of sitting then fell toward shower. Pt agrees that was the case. Pt has some issues with his balance for years. Pt notes recently even seated will lean to one side. When he fell  never lost consciousness and no head trauma. No chest pain preceded the fall. No preceding shortness of breath. Fall occurred at 7 am. No neruologic signs or symptoms described before fall or after fall. He states got up from bed at 7 am to use bathroom when this occurred.  Pt landed on his rt side.   He did get up and urinate about 3-4 times last night. That is new last couple of weeks per his report.  No ha. No dizziness presently.   Pt took ativan last night. Has states has  Been very  long time since has taken. Then took last night and dizzy this am.(he notes).    Review of Systems  Constitutional: Negative for fever, chills, diaphoresis, activity change and fatigue.  Respiratory: Negative for cough, chest tightness and shortness of breath.   Cardiovascular: Negative for chest pain, palpitations and leg swelling.  Gastrointestinal: Negative for nausea, vomiting and abdominal pain.  Musculoskeletal: Negative for neck pain and neck stiffness.       Rt elbow bruise.   Neurological: Positive for weakness. Negative for dizziness, tremors, seizures, syncope, facial asymmetry, speech difficulty, light-headedness, numbness and headaches.       Overall he has baseline weakness in his legs per daughter. Still has decent upper body strength per her report.  Psychiatric/Behavioral: Negative for behavioral problems, confusion and agitation. The patient is not nervous/anxious.     Past Medical History  Diagnosis Date  . Arthritis   . SOB (shortness of breath) on  exertion   . Hyperlipidemia   . Stroke [facility])     TIA history 2005  . Hyperglycemia 01/04/2012  . Leg pain, right 03/01/2012  . Right leg weakness 03/01/2012  . Skin lesion of right arm 09/14/2012  . Abrasion of hand 09/14/2012  . Cerumen impaction 09/14/2012  . Unsteady gait 12/26/2012  . Insomnia 12/26/2012  . Bilateral groin pain 02/13/2013    L>R  . Peripheral edema 03/29/2013  . Preventative health care 06/26/2013  . AAA (abdominal aortic aneurysm) ([place])   . Atrial fibrillation ([place])   . Aortic stenosis, severe 07/02/2015  . Depression 06/17/2015  . New onset a-fib ([place]) 06/17/2015  . Chronic diastolic congestive heart failure ([place])   . Hereditary and idiopathic peripheral neuropathy 08/12/2010  . SPINAL STENOSIS, LUMBAR 08/12/2010  . UNSTEADY GAIT 08/12/2010  . TIA 08/12/2010  . Coronary artery disease 08/01/2015    65% stenosis mid LAD    Social History   Social History  . Marital Status: Married    Spouse Name: N/A  . Number of Children: N/A  . Years of Education: N/A   Occupational History  . Retired    Social History Main Topics  . Smoking status: Former Smoker -- 3.00 packs/day    Quit date: 10/16/1960  . Smokeless tobacco: Never Used  . Alcohol Use: 8.4 oz/week    14 Cans of beer per week     Comment: 1-2 beer daily  .  Drug Use: No  . Sexual Activity: Not on file   Other Topics Concern  . Not on file   Social History Narrative    Past Surgical History  Procedure Laterality Date  . Thumb tendon severed and repaired      right, tendon snapped  . Carpal tunnel release      Right  . Achilles tendon repair      left, repaired w/ wire  . Brain surgery      done by Dr Lizabeth Leyden for tinnitius, unsuccessful, only on right  . Inguinal herniorrhapy  years ago    right and then 2 on left  . Lumbar laminectomy      L3-L5 posterior laminectomy 2006  . Joint replacement      Bilateral total knee replacement  . Spine surgery    . Cardiac catheterization N/A 08/01/2015      Procedure: Right/Left Heart Cath and Coronary Angiography;  Surgeon: Burnell Blanks, MD;  Location: [facility] [facility];  Service: Cardiovascular;  Laterality: N/A;    Family History  Problem Relation Age of Onset  . Other Mother     CHF  . Ulcers Mother     Venous stasis  . Varicose Veins Mother   . Other Father     CHF  . Cancer Sister     unknown  . Hip fracture Maternal Grandmother   . Pneumonia Maternal Grandmother     Allergies  Allergen Reactions  . Niacin And Related Rash    Facial redness and Rash head to toe  . Omnipaque [Iohexol] Rash    Facial redness and flushing    Current Outpatient Prescriptions on File Prior to Visit  Medication Sig Dispense Refill  . bisacodyl (DULCOLAX) 5 MG EC tablet Take 5 mg by mouth 2 (two) times daily.    . clopidogrel (PLAVIX) 75 MG tablet TAKE 1 TABLET BY MOUTH ATBEDTIME. 90 tablet 1  . fish oil-omega-3 fatty acids 1000 MG capsule Take 2 g by mouth at bedtime.     Marland Kitchen ibuprofen (ADVIL,MOTRIN) 200 MG tablet Take 200 mg by mouth daily as needed (pain).    . LORazepam (ATIVAN) 0.5 MG tablet Take 1 tablet (0.5 mg total) by mouth at bedtime as needed for sleep. 90 tablet 1  . Polyvinyl Alcohol-Povidone (REFRESH OP) Place 1 drop into both eyes daily as needed (itching/watery eyes).    . simvastatin (ZOCOR) 40 MG tablet TAKE 1 TABLET BY MOUTH DAILY AT 6 PM. (Patient taking differently: TAKE 1 TABLET BY MOUTH DAILY AT BEDTIME) 90 tablet 1   No current facility-administered medications on file prior to visit.    BP 98/60 mmHg  Pulse 55  Temp(Src) 97 F (36.1 C) (Oral)  Ht 6\' 4"  (1.93 m)  Wt 191 lb 12.8 oz (87 kg)  BMI 23.36 kg/m2  SpO2 98%       Objective:   Physical Exam  General Mental Status- Alert. General Appearance- Not in acute distress.  Alert converses well. Some difficulty hearing but he participates in discussion and appears to understand event that took place this am. Pleasant pt.  Skin General: Color-  Normal Color. Moisture- Normal Moisture. No severe tenting.  Neck Carotid Arteries- Normal color. Moisture- Normal Moisture. No carotid bruits. No JVD.  Chest and Lung Exam Auscultation: Breath Sounds:-Normal. CTA  Cardiovascular Auscultation:Rythm- Regular.  Murmurs & Other Heart Sounds:Auscultation of the heart reveals- No Murmurs.  Abdomen Inspection:-Inspeection Normal. Palpation/Percussion:Note:No mass. Palpation and Percussion of the abdomen reveal-  Non Tender, Non Distended + BS, no rebound or guarding.    Neurologic Cranial Nerve exam:- CN III-XII intact(No nystagmus), symmetric smile. Drift Test:- No drift. .Finger to Nose:- Normal/Intact Strength:- 5/5 equal and symmetric strength both upper extremities. Lower ext 4/5 and symmetic. Daughter states he has weak legs.  Rt shoulder- full rom. No pain on palpation. Rt elbow- small bruise but good rom and no obvious pain now  Rt hip- no pain on rom. Can stand with no pain. But this am when  He fell(reported pain shortly afterwards). Head- no batlte signs. No brusing or tenderness of facial bones. On palpation of his scalp no step off. No hematoma. Normocephalic     Assessment & Plan:  For time being stop ativan. May have had effect on your balance.  Advise caution on changing position so as not to fall.   Get labs today assess if dehydrated, assess if anemic and check electrolytes.  Assess ua and psa to see if maybe recent early uti?? Or prostatitis. Please turn urine in tomorrow am. May get culture.  Hydrate well. Recommend propel.  If any recurrent unsteadiness, any falls or new symptoms then ED evaluation.  Get xray of hip and rt elbow today.  Follow up Friday by phone. Update [place]. Appointment wed dr. Charlett Blake or myself. If over weekend worsen then ED eval

## 2015-10-18 ENCOUNTER — Other Ambulatory Visit: Payer: Medicare HMO

## 2015-10-18 LAB — COMPREHENSIVE METABOLIC PANEL
ALBUMIN: 3.8 g/dL (ref 3.5–5.2)
ALT: 18 U/L (ref 0–53)
AST: 23 U/L (ref 0–37)
Alkaline Phosphatase: 67 U/L (ref 39–117)
BILIRUBIN TOTAL: 1 mg/dL (ref 0.2–1.2)
BUN: 19 mg/dL (ref 6–23)
CALCIUM: 9.1 mg/dL (ref 8.4–10.5)
CHLORIDE: 102 meq/L (ref 96–112)
CO2: 31 meq/L (ref 19–32)
CREATININE: 0.97 mg/dL (ref 0.40–1.50)
GFR: 77.55 mL/min (ref >60.00)
Glucose, Bld: 117 mg/dL — ABNORMAL HIGH (ref 70–99)
Potassium: 4.3 mEq/L (ref 3.5–5.1)
SODIUM: 139 meq/L (ref 135–145)
Total Protein: 6.3 g/dL (ref 6.0–8.3)

## 2015-10-18 LAB — CBC WITH DIFFERENTIAL/PLATELET
BASOS ABS: 0 10*3/uL (ref 0.0–0.1)
Basophils Relative: 0.6 % (ref 0.0–3.0)
EOS ABS: 0.1 10*3/uL (ref 0.0–0.7)
Eosinophils Relative: 1 % (ref 0.0–5.0)
HEMATOCRIT: 40.6 % (ref 39.0–52.0)
Hemoglobin: 13.4 g/dL (ref 13.0–17.0)
LYMPHS PCT: 19.9 % (ref 12.0–46.0)
Lymphs Abs: 1.4 10*3/uL (ref 0.7–4.0)
MCHC: 33 g/dL (ref 30.0–36.0)
MCV: 93.6 fl (ref 78.0–100.0)
MONO ABS: 0.5 10*3/uL (ref 0.1–1.0)
Monocytes Relative: 6.7 % (ref 3.0–12.0)
Neutro Abs: 4.9 10*3/uL (ref 1.4–7.7)
Neutrophils Relative %: 71.8 % (ref 43.0–77.0)
PLATELETS: 158 10*3/uL (ref 150.0–400.0)
RBC: 4.34 Mil/uL (ref 4.22–5.81)
RDW: 15 % (ref 11.5–15.5)
WBC: 6.8 10*3/uL (ref 4.0–10.5)

## 2015-10-18 LAB — PSA, MEDICARE: PSA: 2.67 ng/ml (ref 0.10–4.00)

## 2015-10-19 LAB — URINE CULTURE: Colony Count: 80000

## 2015-10-24 ENCOUNTER — Ambulatory Visit: Payer: Medicare HMO | Admitting: Medical

## 2015-10-31 ENCOUNTER — Telehealth: Payer: Self-pay | Admitting: *Deleted

## 2015-10-31 NOTE — Telephone Encounter (Signed)
Received fax from [facility] for [place], forwarded to provider/SLS

## 2015-12-04 ENCOUNTER — Telehealth: Payer: Self-pay | Admitting: *Deleted

## 2015-12-04 NOTE — Telephone Encounter (Signed)
Received OT progress & Plan of Care; forwarded to provider/SLS 02/07

## 2015-12-05 ENCOUNTER — Telehealth: Payer: Self-pay | Admitting: Family Medicine

## 2015-12-05 NOTE — Telephone Encounter (Signed)
Pts daughter Santiago Glad called in stating her mom left her a msg stating pt is having trouble breathing. When pt wife called to schedule she did not mention trouble breathing. She stated dementia and other long term issues. Santiago Glad states pt lives in [facility] living with nurses on staff. She is calling them to see if they can eval the pt.

## 2015-12-05 NOTE — Telephone Encounter (Signed)
Called pt's daughter, Santiago Glad, to follow up. She states that per her mom (pt's wife), he has been having labored breathing after walking for "a couple weeks." Pt's wife does not feel that his symptoms are emergent, as they occur solely when he is ambulating. The pt is mostly confined to a wheelchair and only walks short distances around his apartment. Pt also has known severe aortic stenosis and physical deconditioning per chart review.   Pt's daughter and son have both called and talked to the pt this afternoon and state that the pt did not sound short of breath. Santiago Glad states that when she called the pt was "breathing ok" and when her brother called he was also breathing fine, but was anxious and upset. She states he has had increased anxiety, anger, and forgetfulness recently, which she believes is r/t progression of his dementia. Verbal reports of pain are unreliable d/t pt's dementia, but pt is not demonstrating any S/S of chest pain per pt's daughter. Pt's wife is out of the home right now for a doctor's appt, but when she returns she is planning to take pt to be evaluated by nursing staff at the facility where he resides. Pt's daughter will also be visiting pt this evening and accompanying pt for nursing evaluation.   Daughter, Santiago Glad, agreed to call the office w/ an update once pt is evaluated at [facility]. Pt already has appt w/ Dr. Charlett Blake tomorrow afternoon 12/06/15 at 2:30pm. Instructed that Santiago Glad that if pt begins to experience chest pain, SOB at rest, or sustained O2 sat <90%, then he should be evaluated in ED. She is agreeable to instructions.

## 2015-12-06 ENCOUNTER — Encounter: Payer: Self-pay | Admitting: Family Medicine

## 2015-12-06 ENCOUNTER — Ambulatory Visit (INDEPENDENT_AMBULATORY_CARE_PROVIDER_SITE_OTHER): Payer: Medicare HMO | Admitting: Family Medicine

## 2015-12-06 VITALS — BP 112/64 | HR 68 | Temp 97.9°F | Wt 191.0 lb

## 2015-12-06 DIAGNOSIS — F32A Depression, unspecified: Secondary | ICD-10-CM

## 2015-12-06 DIAGNOSIS — I350 Nonrheumatic aortic (valve) stenosis: Secondary | ICD-10-CM

## 2015-12-06 DIAGNOSIS — F329 Major depressive disorder, single episode, unspecified: Secondary | ICD-10-CM | POA: Diagnosis not present

## 2015-12-06 DIAGNOSIS — R739 Hyperglycemia, unspecified: Secondary | ICD-10-CM

## 2015-12-06 DIAGNOSIS — M4806 Spinal stenosis, lumbar region: Secondary | ICD-10-CM | POA: Diagnosis not present

## 2015-12-06 DIAGNOSIS — R040 Epistaxis: Secondary | ICD-10-CM

## 2015-12-06 DIAGNOSIS — M48061 Spinal stenosis, lumbar region without neurogenic claudication: Secondary | ICD-10-CM

## 2015-12-06 DIAGNOSIS — G629 Polyneuropathy, unspecified: Secondary | ICD-10-CM

## 2015-12-06 MED ORDER — CITALOPRAM HYDROBROMIDE 10 MG PO TABS: 10.0000 mg | ORAL_TABLET | Freq: Every day | ORAL | Status: DC

## 2015-12-06 NOTE — Patient Instructions (Signed)
[employer] , NOW company Probiotic 10 strain. Neuropathic Pain  Neuropathic pain is pain caused by damage to the nerves that are responsible for certain sensations in your body (sensory nerves). The pain can be caused by damage to:   The sensory nerves that send signals to your spinal cord and brain (peripheral nervous system).  The sensory nerves in your brain or spinal cord (central nervous system). Neuropathic pain can make you more sensitive to pain. What would be a minor sensation for most people may feel very painful if you have neuropathic pain. This is usually a long-term condition that can be difficult to treat. The type of pain can differ from person to person. It may start suddenly (acute), or it may develop slowly and last for a long time (chronic). Neuropathic pain may come and go as damaged nerves heal or may stay at the same level for years. It often causes emotional distress, loss of sleep, and a lower quality of life. CAUSES  The most common cause of damage to a sensory nerve is diabetes. Many other diseases and conditions can also cause neuropathic pain. Causes of neuropathic pain can be classified as:  Toxic. Many drugs and chemicals can cause toxic damage. The most common cause of toxic neuropathic pain is damage from drug treatment for cancer (chemotherapy).  Metabolic. This type of pain can happen when a disease causes imbalances that damage nerves. Diabetes is the most common of these diseases. Vitamin B deficiency caused by long-term alcohol abuse is another common cause.  Traumatic. Any injury that cuts, crushes, or stretches a nerve can cause damage and pain. A common example is feeling pain after losing an arm or leg (phantom limb pain).  Compression-related. If a sensory nerve gets trapped or compressed for a long period of time, the blood supply to the nerve can be cut off.  Vascular. Many blood vessel diseases can cause neuropathic pain by  decreasing blood supply and oxygen to nerves.  Autoimmune. This type of pain results from diseases in which the body's defense system mistakenly attacks sensory nerves. Examples of autoimmune diseases that can cause neuropathic pain include lupus and multiple sclerosis.  Infectious. Many types of viral infections can damage sensory nerves and cause pain. Shingles infection is a common cause of this type of pain.  Inherited. Neuropathic pain can be a symptom of many diseases that are passed down through families (genetic). SIGNS AND SYMPTOMS  The main symptom is pain. Neuropathic pain is often described as:  Burning.  Shock-like.  Stinging.  Hot or cold.  Itching. DIAGNOSIS  No single test can diagnose neuropathic pain. Your health care provider will do a physical exam and ask you about your pain. You may use a pain scale to describe how bad your pain is. You may also have tests to see if you have a high sensitivity to pain and to help find the cause and location of any sensory nerve damage. These tests may include:  Imaging studies, such as:  X-rays.  CT scan.  MRI.  Nerve conduction studies to test how well nerve signals travel through your sensory nerves (electrodiagnostic testing).  Stimulating your sensory nerves through electrodes on your skin and measuring the response in your spinal cord and brain (somatosensory evoked potentials). TREATMENT  Treatment for neuropathic pain may change over time. You may need to try different treatment options or a combination of treatments. Some options include:  Over-the-counter pain relievers.  Prescription medicines.  Some medicines used to treat other conditions may also help neuropathic pain. These include medicines to:  Control seizures (anticonvulsants).  Relieve depression (antidepressants).  Prescription-strength pain relievers (narcotics). These are usually used when other pain relievers do not help.  Transcutaneous nerve  stimulation (TENS). This uses electrical currents to block painful nerve signals. The treatment is painless.  Topical and local anesthetics. These are medicines that numb the nerves. They can be injected as a nerve block or applied to the skin.  Alternative treatments, such as:  Acupuncture.  Meditation.  Massage.  Physical therapy.  Pain management programs.  Counseling. HOME CARE INSTRUCTIONS  Learn as much as you can about your condition.  Take medicines only as directed by your health care provider.  Work closely with all your health care providers to find what works best for you.  Have a good support system at home.  Consider joining a chronic pain support group. SEEK MEDICAL CARE IF:  Your pain treatments are not helping.  You are having side effects from your medicines.  You are struggling with fatigue, mood changes, depression, or anxiety.   This information is not intended to replace advice given to you by your health care provider. Make sure you discuss any questions you have with your health care provider.   Document Released: 07/10/2004 Document Revised: 11/03/2014 Document Reviewed: 03/23/2014 Elsevier Interactive Patient Education [employer].

## 2015-12-06 NOTE — Progress Notes (Signed)
Subjective:    Patient ID: Shawn Cortez, male    DOB: 01/27/1925, 82 y.o.   MRN: [id number]  Chief Complaint  Patient presents with  . Dementia  . Depression    HPI Patient is in today for dementia and depression, patients nosebleed are improving.  Pt also concerned about oxygen levels and breathing when walking and has some complains of the neuropathy in hands.  Denies CP/palp/HA/congestion/fevers/GI or GU c/o. Taking meds as prescribed. He is struggling with fatigue and anhedonia. He is aware that without aortic surgery he will continue to weaken and that is difficult to manage.    Past Medical History  Diagnosis Date  . Arthritis   . SOB (shortness of breath) on exertion   . Hyperlipidemia   . Stroke [facility])     TIA history 2005  . Hyperglycemia 01/04/2012  . Leg pain, right 03/01/2012  . Right leg weakness 03/01/2012  . Skin lesion of right arm 09/14/2012  . Abrasion of hand 09/14/2012  . Cerumen impaction 09/14/2012  . Unsteady gait 12/26/2012  . Insomnia 12/26/2012  . Bilateral groin pain 02/13/2013    L>R  . Peripheral edema 03/29/2013  . Preventative health care 06/26/2013  . AAA (abdominal aortic aneurysm) ([place])   . Atrial fibrillation ([place])   . Aortic stenosis, severe 07/02/2015  . Depression 06/17/2015  . New onset a-fib ([place]) 06/17/2015  . Chronic diastolic congestive heart failure ([place])   . Hereditary and idiopathic peripheral neuropathy 08/12/2010  . SPINAL STENOSIS, LUMBAR 08/12/2010  . UNSTEADY GAIT 08/12/2010  . TIA 08/12/2010  . Coronary artery disease 08/01/2015    65% stenosis mid LAD    Past Surgical History  Procedure Laterality Date  . Thumb tendon severed and repaired      right, tendon snapped  . Carpal tunnel release      Right  . Achilles tendon repair      left, repaired w/ wire  . Brain surgery      done by Dr Lizabeth Leyden for tinnitius, unsuccessful, only on right  . Inguinal herniorrhapy  years ago    right and then 2 on left  . Lumbar  laminectomy      L3-L5 posterior laminectomy 2006  . Joint replacement      Bilateral total knee replacement  . Spine surgery    . Cardiac catheterization N/A 08/01/2015    Procedure: Right/Left Heart Cath and Coronary Angiography;  Surgeon: Burnell Blanks, MD;  Location: [facility];  Service: Cardiovascular;  Laterality: N/A;    Family History  Problem Relation Age of Onset  . Other Mother     CHF  . Ulcers Mother     Venous stasis  . Varicose Veins Mother   . Other Father     CHF  . Cancer Sister     unknown  . Hip fracture Maternal Grandmother   . Pneumonia Maternal Grandmother     Social History   Social History  . Marital Status: Married    Spouse Name: N/A  . Number of Children: N/A  . Years of Education: N/A   Occupational History  . Retired    Social History Main Topics  . Smoking status: Former Smoker -- 3.00 packs/day    Quit date: 10/16/1960  . Smokeless tobacco: Never Used  . Alcohol Use: 8.4 oz/week    14 Cans of beer per week     Comment: 1-2 beer daily  . Drug Use: No  .  Sexual Activity: Not on file   Other Topics Concern  . Not on file   Social History Narrative    Outpatient Prescriptions Prior to Visit  Medication Sig Dispense Refill  . bisacodyl (DULCOLAX) 5 MG EC tablet Take 5 mg by mouth 2 (two) times daily.    . clopidogrel (PLAVIX) 75 MG tablet TAKE 1 TABLET BY MOUTH ATBEDTIME. 90 tablet 1  . fish oil-omega-3 fatty acids 1000 MG capsule Take 2 g by mouth at bedtime.     Marland Kitchen ibuprofen (ADVIL,MOTRIN) 200 MG tablet Take 200 mg by mouth daily as needed (pain).    . LORazepam (ATIVAN) 0.5 MG tablet Take 1 tablet (0.5 mg total) by mouth at bedtime as needed for sleep. 90 tablet 1  . Polyvinyl Alcohol-Povidone (REFRESH OP) Place 1 drop into both eyes daily as needed (itching/watery eyes).    . simvastatin (ZOCOR) 40 MG tablet TAKE 1 TABLET BY MOUTH DAILY AT 6 PM. (Patient taking differently: TAKE 1 TABLET BY MOUTH DAILY AT  BEDTIME) 90 tablet 1   No facility-administered medications prior to visit.    Allergies  Allergen Reactions  . Niacin And Related Rash    Facial redness and Rash head to toe  . Omnipaque [Iohexol] Rash    Facial redness and flushing    ROS     Objective:    Physical Exam  BP 112/64 mmHg  Pulse 68  Temp(Src) 97.9 F (36.6 C) (Oral)  Wt 191 lb (86.637 kg)  SpO2 99% Wt Readings from Last 3 Encounters:  12/06/15 191 lb (86.637 kg)  10/17/15 191 lb 12.8 oz (87 kg)  10/06/15 182 lb (82.555 kg)     Lab Results  Component Value Date   WBC 6.8 10/17/2015   HGB 13.4 10/17/2015   HCT 40.6 10/17/2015   PLT 158.0 10/17/2015   GLUCOSE 117* 10/17/2015   CHOL 115 01/29/2015   TRIG 72.0 01/29/2015   HDL 44.10 01/29/2015   LDLCALC 57 01/29/2015   ALT 18 10/17/2015   AST 23 10/17/2015   NA 139 10/17/2015   K 4.3 10/17/2015   CL 102 10/17/2015   CREATININE 0.97 10/17/2015   BUN 19 10/17/2015   CO2 31 10/17/2015   TSH 1.90 06/04/2015   PSA 2.67 10/17/2015   INR 1.1* 07/30/2015   HGBA1C 5.9 12/07/2015    Lab Results  Component Value Date   TSH 1.90 06/04/2015   Lab Results  Component Value Date   WBC 6.8 10/17/2015   HGB 13.4 10/17/2015   HCT 40.6 10/17/2015   MCV 93.6 10/17/2015   PLT 158.0 10/17/2015   Lab Results  Component Value Date   NA 139 10/17/2015   K 4.3 10/17/2015   CO2 31 10/17/2015   GLUCOSE 117* 10/17/2015   BUN 19 10/17/2015   CREATININE 0.97 10/17/2015   BILITOT 1.0 10/17/2015   ALKPHOS 67 10/17/2015   AST 23 10/17/2015   ALT 18 10/17/2015   PROT 6.3 10/17/2015   ALBUMIN 3.8 10/17/2015   CALCIUM 9.1 10/17/2015   GFR 77.55 10/17/2015   Lab Results  Component Value Date   CHOL 115 01/29/2015   Lab Results  Component Value Date   HDL 44.10 01/29/2015   Lab Results  Component Value Date   LDLCALC 57 01/29/2015   Lab Results  Component Value Date   TRIG 72.0 01/29/2015   Lab Results  Component Value Date   CHOLHDL 3  01/29/2015   Lab Results  Component Value Date  HGBA1C 5.9 12/07/2015       Assessment & Plan:   Problem List Items Addressed This Visit    Aortic stenosis, severe    They have decided not to proceed with surgery due to the high risk of the procedure. He struggles emotionally with what this means and continues to weaken but he does still believe he made the correct decision.       Depression    He agrees to an SSRI at a low dose for now. Will reevaluate at next visit or as needed.      Relevant Medications   citalopram (CELEXA) 10 MG tablet   Frequent nosebleeds    Still easily controlled. Will consider referral if difficult to control      Hyperglycemia   Relevant Orders   Hemoglobin A1c (Completed)   SPINAL STENOSIS, LUMBAR (Chronic)    Is more and more wheelchair bound but is doing well at home in his facility with the help of wife and children       Other Visit Diagnoses    Neuropathy ([place])    -  Primary    Relevant Orders    RPR (Completed)    Vitamin B1 (Completed)       I am having Mr. Curren start on citalopram. I am also having him maintain his fish oil-omega-3 fatty acids, clopidogrel, simvastatin, bisacodyl, Polyvinyl Alcohol-Povidone (REFRESH OP), ibuprofen, and LORazepam.  Meds ordered this encounter  Medications  . citalopram (CELEXA) 10 MG tablet    Sig: Take 1 tablet (10 mg total) by mouth daily.    Dispense:  30 tablet    Refill:  3     Penni Homans, MD

## 2015-12-06 NOTE — Progress Notes (Signed)
Pre visit review using our clinic review tool, if applicable. No additional management support is needed unless otherwise documented below in the visit note. 

## 2015-12-07 ENCOUNTER — Other Ambulatory Visit: Payer: Medicare HMO

## 2015-12-07 LAB — HEMOGLOBIN A1C: Hgb A1c MFr Bld: 5.9 % (ref 4.6–6.5)

## 2015-12-08 LAB — RPR

## 2015-12-11 LAB — VITAMIN B1: Vitamin B1 (Thiamine): 34 nmol/L — ABNORMAL HIGH (ref 8–30)

## 2015-12-14 ENCOUNTER — Telehealth: Payer: Self-pay | Admitting: Family Medicine

## 2015-12-14 NOTE — Telephone Encounter (Signed)
Informed the patients wife of lab results from 12/07/2015 Patient stated the patient took one citalopram and that night had diarrhea all night.  The wife did not give him another one for 4 days and did give him one this morning.  She will report back if he gets diarrhea again assuming then the cause is the citalopram.

## 2015-12-14 NOTE — Telephone Encounter (Signed)
Caller name: Arbie Cookey Relationship to patient: Wife Can be reached: [phone]  Reason for call: Request results of recent labs

## 2015-12-16 ENCOUNTER — Encounter: Payer: Self-pay | Admitting: Family Medicine

## 2015-12-16 NOTE — Assessment & Plan Note (Signed)
Still easily controlled. Will consider referral if difficult to control

## 2015-12-16 NOTE — Assessment & Plan Note (Signed)
He agrees to an SSRI at a low dose for now. Will reevaluate at next visit or as needed.

## 2015-12-16 NOTE — Assessment & Plan Note (Signed)
Is more and more wheelchair bound but is doing well at home in his facility with the help of wife and children

## 2015-12-16 NOTE — Assessment & Plan Note (Signed)
They have decided not to proceed with surgery due to the high risk of the procedure. He struggles emotionally with what this means and continues to weaken but he does still believe he made the correct decision.

## 2015-12-22 ENCOUNTER — Emergency Department
Admission: EM | Admit: 2015-12-22 | Discharge: 2015-12-22 | Disposition: A | Discharge status: Nursing Home (Includes ICF) | Payer: Medicare HMO | Source: Home / Self Care | Attending: Family Medicine | Admitting: Family Medicine

## 2015-12-22 ENCOUNTER — Inpatient Hospital Stay (HOSPITAL_COMMUNITY)
Admission: EM | Admit: 2015-12-22 | Discharge: 2015-12-25 | DRG: 292 | Disposition: A | Discharge status: Skilled Nursing Facility | Payer: Medicare HMO | Attending: Internal Medicine | Admitting: Internal Medicine

## 2015-12-22 ENCOUNTER — Encounter (HOSPITAL_COMMUNITY): Payer: Self-pay | Admitting: Emergency Medicine

## 2015-12-22 ENCOUNTER — Encounter: Payer: Self-pay | Admitting: Emergency Medicine

## 2015-12-22 ENCOUNTER — Emergency Department (HOSPITAL_COMMUNITY): Payer: Medicare HMO

## 2015-12-22 DIAGNOSIS — Z7902 Long term (current) use of antithrombotics/antiplatelets: Secondary | ICD-10-CM

## 2015-12-22 DIAGNOSIS — R300 Dysuria: Secondary | ICD-10-CM | POA: Diagnosis not present

## 2015-12-22 DIAGNOSIS — R778 Other specified abnormalities of plasma proteins: Secondary | ICD-10-CM | POA: Insufficient documentation

## 2015-12-22 DIAGNOSIS — R350 Frequency of micturition: Secondary | ICD-10-CM | POA: Diagnosis present

## 2015-12-22 DIAGNOSIS — R7989 Other specified abnormal findings of blood chemistry: Secondary | ICD-10-CM | POA: Insufficient documentation

## 2015-12-22 DIAGNOSIS — G609 Hereditary and idiopathic neuropathy, unspecified: Secondary | ICD-10-CM | POA: Diagnosis present

## 2015-12-22 DIAGNOSIS — R262 Difficulty in walking, not elsewhere classified: Secondary | ICD-10-CM | POA: Diagnosis present

## 2015-12-22 DIAGNOSIS — R0600 Dyspnea, unspecified: Secondary | ICD-10-CM | POA: Insufficient documentation

## 2015-12-22 DIAGNOSIS — Z9181 History of falling: Secondary | ICD-10-CM

## 2015-12-22 DIAGNOSIS — I350 Nonrheumatic aortic (valve) stenosis: Secondary | ICD-10-CM | POA: Diagnosis not present

## 2015-12-22 DIAGNOSIS — M79642 Pain in left hand: Secondary | ICD-10-CM

## 2015-12-22 DIAGNOSIS — E876 Hypokalemia: Secondary | ICD-10-CM | POA: Diagnosis not present

## 2015-12-22 DIAGNOSIS — Z66 Do not resuscitate: Secondary | ICD-10-CM | POA: Diagnosis present

## 2015-12-22 DIAGNOSIS — I2510 Atherosclerotic heart disease of native coronary artery without angina pectoris: Secondary | ICD-10-CM | POA: Diagnosis not present

## 2015-12-22 DIAGNOSIS — R600 Localized edema: Secondary | ICD-10-CM | POA: Diagnosis present

## 2015-12-22 DIAGNOSIS — I5032 Chronic diastolic (congestive) heart failure: Secondary | ICD-10-CM | POA: Diagnosis present

## 2015-12-22 DIAGNOSIS — Z87891 Personal history of nicotine dependence: Secondary | ICD-10-CM

## 2015-12-22 DIAGNOSIS — E440 Moderate protein-calorie malnutrition: Secondary | ICD-10-CM | POA: Insufficient documentation

## 2015-12-22 DIAGNOSIS — R609 Edema, unspecified: Secondary | ICD-10-CM

## 2015-12-22 DIAGNOSIS — R748 Abnormal levels of other serum enzymes: Secondary | ICD-10-CM | POA: Diagnosis present

## 2015-12-22 DIAGNOSIS — I4510 Unspecified right bundle-branch block: Secondary | ICD-10-CM | POA: Diagnosis present

## 2015-12-22 DIAGNOSIS — I5033 Acute on chronic diastolic (congestive) heart failure: Secondary | ICD-10-CM | POA: Diagnosis not present

## 2015-12-22 DIAGNOSIS — R531 Weakness: Secondary | ICD-10-CM

## 2015-12-22 DIAGNOSIS — R39198 Other difficulties with micturition: Secondary | ICD-10-CM

## 2015-12-22 DIAGNOSIS — I480 Paroxysmal atrial fibrillation: Secondary | ICD-10-CM | POA: Diagnosis present

## 2015-12-22 DIAGNOSIS — Z8679 Personal history of other diseases of the circulatory system: Secondary | ICD-10-CM

## 2015-12-22 DIAGNOSIS — F0390 Unspecified dementia without behavioral disturbance: Secondary | ICD-10-CM | POA: Diagnosis present

## 2015-12-22 DIAGNOSIS — R42 Dizziness and giddiness: Secondary | ICD-10-CM | POA: Insufficient documentation

## 2015-12-22 DIAGNOSIS — R451 Restlessness and agitation: Secondary | ICD-10-CM | POA: Diagnosis present

## 2015-12-22 DIAGNOSIS — Z8673 Personal history of transient ischemic attack (TIA), and cerebral infarction without residual deficits: Secondary | ICD-10-CM

## 2015-12-22 DIAGNOSIS — E785 Hyperlipidemia, unspecified: Secondary | ICD-10-CM | POA: Diagnosis present

## 2015-12-22 DIAGNOSIS — R269 Unspecified abnormalities of gait and mobility: Secondary | ICD-10-CM | POA: Diagnosis not present

## 2015-12-22 DIAGNOSIS — I493 Ventricular premature depolarization: Secondary | ICD-10-CM | POA: Diagnosis present

## 2015-12-22 DIAGNOSIS — Z515 Encounter for palliative care: Secondary | ICD-10-CM | POA: Insufficient documentation

## 2015-12-22 DIAGNOSIS — G4700 Insomnia, unspecified: Secondary | ICD-10-CM | POA: Diagnosis present

## 2015-12-22 DIAGNOSIS — R339 Retention of urine, unspecified: Secondary | ICD-10-CM | POA: Diagnosis present

## 2015-12-22 DIAGNOSIS — Z6822 Body mass index (BMI) 22.0-22.9, adult: Secondary | ICD-10-CM

## 2015-12-22 DIAGNOSIS — M79641 Pain in right hand: Secondary | ICD-10-CM | POA: Insufficient documentation

## 2015-12-22 DIAGNOSIS — T502X5A Adverse effect of carbonic-anhydrase inhibitors, benzothiadiazides and other diuretics, initial encounter: Secondary | ICD-10-CM | POA: Diagnosis not present

## 2015-12-22 LAB — BASIC METABOLIC PANEL
Anion gap: 12 (ref 5–15)
BUN: 21 mg/dL — AB (ref 6–20)
CALCIUM: 8.9 mg/dL (ref 8.9–10.3)
CO2: 23 mmol/L (ref 22–32)
CREATININE: 0.84 mg/dL (ref 0.61–1.24)
Chloride: 100 mmol/L — ABNORMAL LOW (ref 101–111)
GFR calc Af Amer: >60 mL/min (ref >60)
GLUCOSE: 98 mg/dL (ref 65–99)
Potassium: 4.2 mmol/L (ref 3.5–5.1)
Sodium: 135 mmol/L (ref 135–145)

## 2015-12-22 LAB — CBC
HCT: 38.3 % — ABNORMAL LOW (ref 39.0–52.0)
HEMATOCRIT: 37.7 % — AB (ref 39.0–52.0)
Hemoglobin: 12.7 g/dL — ABNORMAL LOW (ref 13.0–17.0)
Hemoglobin: 12.4 g/dL — ABNORMAL LOW (ref 13.0–17.0)
MCH: 30.5 pg (ref 26.0–34.0)
MCH: 30.5 pg (ref 26.0–34.0)
MCHC: 33.2 g/dL (ref 30.0–36.0)
MCHC: 32.9 g/dL (ref 30.0–36.0)
MCV: 92.1 fL (ref 78.0–100.0)
MCV: 92.9 fL (ref 78.0–100.0)
Platelets: 159 10*3/uL (ref 150–400)
Platelets: 154 10*3/uL (ref 150–400)
RBC: 4.16 MIL/uL — ABNORMAL LOW (ref 4.22–5.81)
RBC: 4.06 MIL/uL — ABNORMAL LOW (ref 4.22–5.81)
RDW: 14.7 % (ref 11.5–15.5)
RDW: 14.7 % (ref 11.5–15.5)
WBC: 4.1 10*3/uL (ref 4.0–10.5)
WBC: 3.5 10*3/uL — ABNORMAL LOW (ref 4.0–10.5)

## 2015-12-22 LAB — URINALYSIS, ROUTINE W REFLEX MICROSCOPIC
BILIRUBIN URINE: NEGATIVE
GLUCOSE, UA: NEGATIVE mg/dL
Hgb urine dipstick: NEGATIVE
KETONES UR: NEGATIVE mg/dL
Leukocytes, UA: NEGATIVE
NITRITE: NEGATIVE
PH: 5 (ref 5.0–8.0)
Protein, ur: NEGATIVE mg/dL
SPECIFIC GRAVITY, URINE: 1.021 (ref 1.005–1.030)

## 2015-12-22 LAB — I-STAT TROPONIN, ED: TROPONIN I, POC: 0.09 ng/mL — AB (ref 0.00–0.08)

## 2015-12-22 LAB — TROPONIN I: TROPONIN I: 0.1 ng/mL — AB (ref <0.031)

## 2015-12-22 LAB — CREATININE, SERUM
Creatinine, Ser: 0.83 mg/dL (ref 0.61–1.24)
GFR calc Af Amer: >60 mL/min (ref >60)

## 2015-12-22 LAB — BRAIN NATRIURETIC PEPTIDE: B Natriuretic Peptide: 456.5 pg/mL — ABNORMAL HIGH (ref 0.0–100.0)

## 2015-12-22 MED ORDER — ENOXAPARIN SODIUM 40 MG/0.4ML ~~LOC~~ SOLN
40.0000 mg | SUBCUTANEOUS | Status: DC
  Administered 2015-12-22 – 2015-12-24 (×3): 40 mg via SUBCUTANEOUS
  Filled 2015-12-22 (×3): qty 0.4

## 2015-12-22 MED ORDER — LORAZEPAM 0.5 MG PO TABS
0.5000 mg | ORAL_TABLET | Freq: Every evening | ORAL | Status: DC | PRN
  Administered 2015-12-22: 0.5 mg via ORAL
  Filled 2015-12-22: qty 1

## 2015-12-22 MED ORDER — OMEGA-3-ACID ETHYL ESTERS 1 G PO CAPS
2.0000 g | ORAL_CAPSULE | Freq: Every day | ORAL | Status: DC
  Administered 2015-12-22 – 2015-12-24 (×3): 2 g via ORAL
  Filled 2015-12-22 (×3): qty 2

## 2015-12-22 MED ORDER — ONDANSETRON HCL 4 MG/2ML IJ SOLN: 4.0000 mg | Freq: Four times a day (QID) | INTRAMUSCULAR | Status: DC | PRN

## 2015-12-22 MED ORDER — FUROSEMIDE 10 MG/ML IJ SOLN
20.0000 mg | Freq: Two times a day (BID) | INTRAMUSCULAR | Status: DC
  Administered 2015-12-22 – 2015-12-24 (×4): 20 mg via INTRAVENOUS
  Filled 2015-12-22 (×4): qty 2

## 2015-12-22 MED ORDER — BOOST HIGH PROTEIN PO LIQD
Freq: Every day | ORAL | Status: DC
  Administered 2015-12-23 – 2015-12-24 (×2): via ORAL
  Filled 2015-12-22 (×3): qty 237

## 2015-12-22 MED ORDER — ACETAMINOPHEN 325 MG PO TABS
650.0000 mg | ORAL_TABLET | Freq: Four times a day (QID) | ORAL | Status: DC | PRN
  Administered 2015-12-23: 650 mg via ORAL
  Filled 2015-12-22: qty 2

## 2015-12-22 MED ORDER — SODIUM CHLORIDE 0.9% FLUSH
3.0000 mL | Freq: Two times a day (BID) | INTRAVENOUS | Status: DC
  Administered 2015-12-22 – 2015-12-25 (×6): 3 mL via INTRAVENOUS

## 2015-12-22 MED ORDER — OMEGA-3 FATTY ACIDS 1000 MG PO CAPS: 2.0000 g | ORAL_CAPSULE | Freq: Every day | ORAL | Status: DC

## 2015-12-22 MED ORDER — POLYETHYLENE GLYCOL 3350 17 G PO PACK: 17.0000 g | PACK | Freq: Every evening | ORAL | Status: DC | PRN

## 2015-12-22 MED ORDER — SIMETHICONE 80 MG PO CHEW: 80.0000 mg | CHEWABLE_TABLET | Freq: Four times a day (QID) | ORAL | Status: DC | PRN

## 2015-12-22 MED ORDER — IBUPROFEN 200 MG PO TABS
200.0000 mg | ORAL_TABLET | Freq: Every day | ORAL | Status: DC | PRN
  Administered 2015-12-22: 200 mg via ORAL
  Filled 2015-12-22 (×2): qty 1

## 2015-12-22 MED ORDER — ONDANSETRON HCL 4 MG PO TABS: 4.0000 mg | ORAL_TABLET | Freq: Four times a day (QID) | ORAL | Status: DC | PRN

## 2015-12-22 MED ORDER — CLOPIDOGREL BISULFATE 75 MG PO TABS
75.0000 mg | ORAL_TABLET | Freq: Every day | ORAL | Status: DC
  Administered 2015-12-22 – 2015-12-25 (×4): 75 mg via ORAL
  Filled 2015-12-22 (×4): qty 1

## 2015-12-22 MED ORDER — SIMVASTATIN 40 MG PO TABS
40.0000 mg | ORAL_TABLET | Freq: Every day | ORAL | Status: DC
  Administered 2015-12-22 – 2015-12-24 (×3): 40 mg via ORAL
  Filled 2015-12-22 (×3): qty 1

## 2015-12-22 MED ORDER — BISACODYL 5 MG PO TBEC: 5.0000 mg | DELAYED_RELEASE_TABLET | Freq: Every evening | ORAL | Status: DC | PRN

## 2015-12-22 MED ORDER — ACETAMINOPHEN 650 MG RE SUPP: 650.0000 mg | Freq: Four times a day (QID) | RECTAL | Status: DC | PRN

## 2015-12-22 MED ORDER — CARBOXYMETHYLCELLULOSE SODIUM 1 % OP SOLN: 1.0000 [drp] | Freq: Two times a day (BID) | OPHTHALMIC | Status: DC

## 2015-12-22 MED ORDER — HYPROMELLOSE (GONIOSCOPIC) 2.5 % OP SOLN
1.0000 [drp] | Freq: Two times a day (BID) | OPHTHALMIC | Status: DC
  Administered 2015-12-22 – 2015-12-25 (×6): 1 [drp] via OPHTHALMIC
  Filled 2015-12-22: qty 15

## 2015-12-22 NOTE — ED Notes (Signed)
Pt sent from urgent care in [facility] for further eval of weakness and shortness of breath. Pt also has increased urination.

## 2015-12-22 NOTE — ED Notes (Signed)
Attempted report x1. 

## 2015-12-22 NOTE — ED Notes (Signed)
Admitting MD at bedside.

## 2015-12-22 NOTE — Consult Note (Signed)
Patient ID: TANNON MIRRO MRN: [id number] DOB/AGE: [birth year] 82 y.o.  Admit date: 12/22/2015 Primary Physician Penni Homans, MD  Primary Cardiologist Erich Kochan C. Oval Linsey, MD   Chief Complaint  Weakness, difficulty urinating. Found to have elevated troponin.  HPI: Mr. Buchberger is an 82M with chronic diastolic heart failure, CAD, paroxysmal atrial fibrillation, TIAs, gait disturbance, dementia, severe deconditioning, and severe AS here with weakness and increased urinary frequency.  He was in his usual state of health until last night when he had to go to the bathroom several times but was unable to urinate.  He noted that his left leg was weaker than usual and he had a harder time walking than usual, though he is unsteady on his feet at baseline.  His wife recommended that he not try to get up and put on a diaper.   He noticed that his urine smelled foul, as if he had been eating asparagus.  He denies any chest pain or increased shortness of breath.  He did feel mildly dizzy.  He has chronic lower extremity edema but his wife thinks it may be slightly worse than usual.  Mr. Smithart was evaluated by Dr. Cyndia Bent on 08/16/15 and Dr. Angelena Form on 07/11/15.  He was found to be a candidate for a 29 mm Sapien 3 valve via the transfemoral route, though his external iliac arteries are very tortuous.  He was not thought to be a candidate for surgical AVR.  However, it was unclear how much of his symptoms were due to the AS vs his deconditioning.  After those appointment, Mr. Leber's wife and 2 adult children decided against pursing TAVR due to concerns about anesthesia.  He did very poorly with anesthesia for tooth extractions and they do not think he could tolerate doing this again.  They have decided to pursue a palliative approach.  They currently live in an assisted living facility and can move to skilled nursing if needed.   Review of Systems: A 12 point review of systems was obtained and was  negative with exceptions as noted in the HPI.  Past Medical History  Diagnosis Date  . Arthritis   . SOB (shortness of breath) on exertion   . Hyperlipidemia   . Stroke [facility])     TIA history 2005  . Hyperglycemia 01/04/2012  . Leg pain, right 03/01/2012  . Right leg weakness 03/01/2012  . Skin lesion of right arm 09/14/2012  . Abrasion of hand 09/14/2012  . Cerumen impaction 09/14/2012  . Unsteady gait 12/26/2012  . Insomnia 12/26/2012  . Bilateral groin pain 02/13/2013    L>R  . Peripheral edema 03/29/2013  . Preventative health care 06/26/2013  . AAA (abdominal aortic aneurysm) ([place])   . Atrial fibrillation ([place])   . Aortic stenosis, severe 07/02/2015  . Depression 06/17/2015  . New onset a-fib ([place]) 06/17/2015  . Chronic diastolic congestive heart failure ([place])   . Hereditary and idiopathic peripheral neuropathy 08/12/2010  . SPINAL STENOSIS, LUMBAR 08/12/2010  . UNSTEADY GAIT 08/12/2010  . TIA 08/12/2010  . Coronary artery disease 08/01/2015    65% stenosis mid LAD     (Not in a hospital admission)     Infusions:   Allergies  Allergen Reactions  . Niacin And Related Rash    Facial redness and Rash head to toe  . Omnipaque [Iohexol] Rash    Facial redness and flushing    Social History   Social History  . Marital Status: Married  Spouse Name: N/A  . Number of Children: N/A  . Years of Education: N/A   Occupational History  . Retired    Social History Main Topics  . Smoking status: Former Smoker -- 3.00 packs/day    Quit date: 10/16/1960  . Smokeless tobacco: Never Used  . Alcohol Use: 8.4 oz/week    14 Cans of beer per week     Comment: 1-2 beer daily  . Drug Use: No  . Sexual Activity: Not on file   Other Topics Concern  . Not on file   Social History Narrative    Family History  Problem Relation Age of Onset  . Other Mother     CHF  . Ulcers Mother     Venous stasis  . Varicose Veins Mother   . Other Father     CHF  . Cancer Sister      unknown  . Hip fracture Maternal Grandmother   . Pneumonia Maternal Grandmother     PHYSICAL EXAM: Filed Vitals:   12/22/15 1435  BP: 103/74  Pulse: 71  Temp: 97.6 F (36.4 C)  Resp: 18    No intake or output data in the 24 hours ending 12/22/15 1640  General:  Well appearing. No respiratory difficulty HEENT: normal Neck: supple. JVD 2 cm above the clavicle at 45 degrees.  Carotids 2+ bilat; no bruits. No lymphadenopathy or thryomegaly appreciated. Cor: PMI nondisplaced. Regular rate & rhythm. No rubs or gallops.  III/VI late-peaking systolic murmur. Lungs:  Diminished at the bases Abdomen: soft, nontender, nondistended. No hepatosplenomegaly. No bruits or masses. Good bowel sounds. Extremities: no cyanosis, clubbing, rash, edema Neuro: alert & oriented x 3, cranial nerves grossly intact. moves all 4 extremities w/o difficulty. Affect pleasant.  Results for orders placed or performed during the hospital encounter of 12/22/15 (from the past 24 hour(s))  Basic metabolic panel     Status: Abnormal   Collection Time: 12/22/15  2:50 PM  Result Value Ref Range   Sodium 135 135 - 145 mmol/L   Potassium 4.2 3.5 - 5.1 mmol/L   Chloride 100 (L) 101 - 111 mmol/L   CO2 23 22 - 32 mmol/L   Glucose, Bld 98 65 - 99 mg/dL   BUN 21 (H) 6 - 20 mg/dL   Creatinine, Ser 0.84 0.61 - 1.24 mg/dL   Calcium 8.9 8.9 - 10.3 mg/dL   GFR calc non Af Amer >60 >60 mL/min   GFR calc Af Amer >60 >60 mL/min   Anion gap 12 5 - 15  CBC     Status: Abnormal   Collection Time: 12/22/15  2:50 PM  Result Value Ref Range   WBC 4.1 4.0 - 10.5 K/uL   RBC 4.16 (L) 4.22 - 5.81 MIL/uL   Hemoglobin 12.7 (L) 13.0 - 17.0 g/dL   HCT 38.3 (L) 39.0 - 52.0 %   MCV 92.1 78.0 - 100.0 fL   MCH 30.5 26.0 - 34.0 pg   MCHC 33.2 30.0 - 36.0 g/dL   RDW 14.7 11.5 - 15.5 %   Platelets 159 150 - 400 K/uL  I-stat troponin, ED (not at [facility], [facility])     Status: Abnormal   Collection Time: 12/22/15  3:04 PM  Result Value Ref Range     Troponin i, poc 0.09 (HH) 0.00 - 0.08 ng/mL   Comment NOTIFIED PHYSICIAN    Comment 3          Brain natriuretic peptide     Status: Abnormal  Collection Time: 12/22/15  3:11 PM  Result Value Ref Range   B Natriuretic Peptide 456.5 (H) 0.0 - 100.0 pg/mL   Dg Chest 2 View  12/22/2015  CLINICAL DATA:  Bilateral leg weakness.  Shortness of breath. EXAM: CHEST  2 VIEW COMPARISON:  Chest CT 08/13/2015 FINDINGS: There are small bilateral pleural effusions with bibasilar opacities, likely atelectasis. Cardiomegaly with vascular congestion. No overt edema. No acute bony abnormality. IMPRESSION: Cardiomegaly with vascular congestion, small bilateral effusions and bibasilar atelectasis. Electronically Signed   By: Rolm Baptise M.D.   On: 12/22/2015 15:46    ECG: atrial fibrillation rate 83 bpm.  RBBB.   ASSESSMENT/PLAN:   # Severe aortic stenosis: Mr. Kawecki is a candidate for TAVR but he and his family choose palliation.  This seems reasonable.  We will attempt to optimize his volume status. - lasix 20 mg IV bid - Palliative Care consult  # CAD, elevated troponin: Cardiac cath revealed a 65% LAD lesion that could be the cause of the elevated troponin.  However, without significant EKG changes or chest pain, this seems unlikely.  Also, they are not interested in cardiac cath.  Continue plavix for now.  However, could consider stopping this given his goals of care.  # Atrial fibrillation: Currently in sinus rhythm.  Not on anticoagulation given that he is on plavix and is a fall risk.  He has not been requiring any rate control as an outpatient.  # Hyperlipidemia:  Given that we are pursuing a palliative approach, stop statin.  # Increased urinary frequency:  Bladder scan and urinalysis pending.  Signed: Lachanda Buczek C. Oval Linsey, MD, [facility]  12/22/2015, 4:40 PM

## 2015-12-22 NOTE — ED Provider Notes (Signed)
CSN: [id number]     Arrival date & time 12/22/15  1432 History   First MD Initiated Contact with Patient 12/22/15 1459     Chief Complaint  Patient presents with  . Weakness  . Shortness of Breath     (Consider location/radiation/quality/duration/timing/severity/associated sxs/prior Treatment) HPI   Pt with hx aortic stenosis, TIA, CAD (cath Oct 2016, 65% stenosis LAD), chronic diastolic heart failure, AAA, spinal stenosis p/w generalized weakness x 1 day, lightheadedness, and dysuria, difficulty urinating x 2 days.  Per family, pt has been declining physically over the past month with increasing weakness, but was able to ambulate with a walker until today.  Pt has chronic right leg weakness that is unchanged, denies any focal or unilateral tenderness.  Wife notes patient's breathing has worsened recently and he notes he seems to be SOB with just standing - has severe aortic stenosis and has declined intervention.  Wife notes bilateral lower extremity edema has been ongoing x 1 month without change.   Denies fevers, chills, myalgias, CP, abdominal pain, N/V/D, change in bowel habits, hematochezia.  Denies focal or unilateral weakness.    Cardiologist Dr Angelena Form.     Past Medical History  Diagnosis Date  . Arthritis   . SOB (shortness of breath) on exertion   . Hyperlipidemia   . Stroke [facility])     TIA history 2005  . Hyperglycemia 01/04/2012  . Leg pain, right 03/01/2012  . Right leg weakness 03/01/2012  . Skin lesion of right arm 09/14/2012  . Abrasion of hand 09/14/2012  . Cerumen impaction 09/14/2012  . Unsteady gait 12/26/2012  . Insomnia 12/26/2012  . Bilateral groin pain 02/13/2013    L>R  . Peripheral edema 03/29/2013  . Preventative health care 06/26/2013  . AAA (abdominal aortic aneurysm) ([place])   . Atrial fibrillation ([place])   . Aortic stenosis, severe 07/02/2015  . Depression 06/17/2015  . New onset a-fib ([place]) 06/17/2015  . Chronic diastolic congestive heart failure ([place])   .  Hereditary and idiopathic peripheral neuropathy 08/12/2010  . SPINAL STENOSIS, LUMBAR 08/12/2010  . UNSTEADY GAIT 08/12/2010  . TIA 08/12/2010  . Coronary artery disease 08/01/2015    65% stenosis mid LAD   Past Surgical History  Procedure Laterality Date  . Thumb tendon severed and repaired      right, tendon snapped  . Carpal tunnel release      Right  . Achilles tendon repair      left, repaired w/ wire  . Brain surgery      done by Dr Lizabeth Leyden for tinnitius, unsuccessful, only on right  . Inguinal herniorrhapy  years ago    right and then 2 on left  . Lumbar laminectomy      L3-L5 posterior laminectomy 2006  . Joint replacement      Bilateral total knee replacement  . Spine surgery    . Cardiac catheterization N/A 08/01/2015    Procedure: Right/Left Heart Cath and Coronary Angiography;  Surgeon: Burnell Blanks, MD;  Location: [facility];  Service: Cardiovascular;  Laterality: N/A;   Family History  Problem Relation Age of Onset  . Other Mother     CHF  . Ulcers Mother     Venous stasis  . Varicose Veins Mother   . Other Father     CHF  . Cancer Sister     unknown  . Hip fracture Maternal Grandmother   . Pneumonia Maternal Grandmother    Social History  Substance  Use Topics  . Smoking status: Former Smoker -- 3.00 packs/day    Quit date: 10/16/1960  . Smokeless tobacco: Never Used  . Alcohol Use: 8.4 oz/week    14 Cans of beer per week     Comment: 1-2 beer daily    Review of Systems  Constitutional: Negative for fever and chills.  Respiratory: Positive for shortness of breath. Negative for cough.   Cardiovascular: Negative for chest pain.  Gastrointestinal: Negative for abdominal pain.  Genitourinary: Positive for dysuria.  Skin: Negative for rash.  Psychiatric/Behavioral: Negative for self-injury.  All other systems reviewed and are negative.     Allergies  Niacin and related and Omnipaque  Home Medications   Prior to Admission  medications   Medication Sig Start Date End Date Taking? Authorizing Provider  bisacodyl (DULCOLAX) 5 MG EC tablet Take 5 mg by mouth 2 (two) times daily.    Historical Provider, MD  citalopram (CELEXA) 10 MG tablet Take 1 tablet (10 mg total) by mouth daily. 12/06/15   Mosie Lukes, MD  clopidogrel (PLAVIX) 75 MG tablet TAKE 1 TABLET BY MOUTH ATBEDTIME. 07/13/15   Mosie Lukes, MD  fish oil-omega-3 fatty acids 1000 MG capsule Take 2 g by mouth at bedtime.     Historical Provider, MD  ibuprofen (ADVIL,MOTRIN) 200 MG tablet Take 200 mg by mouth daily as needed (pain).    Historical Provider, MD  LORazepam (ATIVAN) 0.5 MG tablet Take 1 tablet (0.5 mg total) by mouth at bedtime as needed for sleep. 08/20/15   Mosie Lukes, MD  Polyvinyl Alcohol-Povidone (REFRESH OP) Place 1 drop into both eyes daily as needed (itching/watery eyes).    Historical Provider, MD  simvastatin (ZOCOR) 40 MG tablet TAKE 1 TABLET BY MOUTH DAILY AT 6 PM. Patient taking differently: TAKE 1 TABLET BY MOUTH DAILY AT BEDTIME 07/13/15   Mosie Lukes, MD   BP 103/74 mmHg  Pulse 71  Temp(Src) 97.6 F (36.4 C) (Oral)  Resp 18  Ht 6\' 4"  (1.93 m)  Wt 81.647 kg  BMI 21.92 kg/m2  SpO2 98% Physical Exam  Constitutional: He appears well-developed and well-nourished. No distress.  thin  HENT:  Head: Normocephalic and atraumatic.  Neck: Neck supple.  Cardiovascular: Normal rate and regular rhythm.   Murmur heard. Pulmonary/Chest: Effort normal and breath sounds normal. No respiratory distress. He has no wheezes. He has no rales.  Abdominal: Soft. He exhibits no distension and no mass. There is no tenderness. There is no rebound and no guarding.  Musculoskeletal: He exhibits edema (pitting edema of bilateral lower extremities through the mid shin, symmetric. ).  Neurological: He is alert. No cranial nerve deficit or sensory deficit. He exhibits normal muscle tone. GCS eye subscore is 4. GCS verbal subscore is 5. GCS motor  subscore is 6.  No pronator drift.  Upper extremities 5/5 strength, lower extremities with decreased strength of right lower extremity vs left, noted to be baseline by pt and family.   Skin: He is not diaphoretic.  Nursing note and vitals reviewed.   ED Course  Procedures (including critical care time) Labs Review Labs Reviewed  BASIC METABOLIC PANEL - Abnormal; Notable for the following:    Chloride 100 (*)    BUN 21 (*)    All other components within normal limits  CBC - Abnormal; Notable for the following:    RBC 4.16 (*)    Hemoglobin 12.7 (*)    HCT 38.3 (*)    All  other components within normal limits  BRAIN NATRIURETIC PEPTIDE - Abnormal; Notable for the following:    B Natriuretic Peptide 456.5 (*)    All other components within normal limits  TROPONIN I - Abnormal; Notable for the following:    Troponin I 0.10 (*)    All other components within normal limits  I-STAT TROPOININ, ED - Abnormal; Notable for the following:    Troponin i, poc 0.09 (*)    All other components within normal limits  URINE CULTURE  URINALYSIS, ROUTINE W REFLEX MICROSCOPIC (NOT AT [facility])    Imaging Review Dg Chest 2 View  12/22/2015  CLINICAL DATA:  Bilateral leg weakness.  Shortness of breath. EXAM: CHEST  2 VIEW COMPARISON:  Chest CT 08/13/2015 FINDINGS: There are small bilateral pleural effusions with bibasilar opacities, likely atelectasis. Cardiomegaly with vascular congestion. No overt edema. No acute bony abnormality. IMPRESSION: Cardiomegaly with vascular congestion, small bilateral effusions and bibasilar atelectasis. Electronically Signed   By: Rolm Baptise M.D.   On: 12/22/2015 15:46   I have personally reviewed and evaluated these images and lab results as part of my medical decision-making.   EKG Interpretation   Date/Time:  Saturday December 22 2015 14:37:38 EST Ventricular Rate:  83 PR Interval:    QRS Duration: 140 QT Interval:  450 QTC Calculation: 528 R Axis:   90 Text  Interpretation:  Atrial fibrillation with premature ventricular or  aberrantly conducted complexes Right bundle branch block Abnormal ECG No  significant change since last tracing Confirmed by FLOYD MD, Quillian Quince  [id number]) on 12/22/2015 3:23:56 PM       3:25 PM Discussed pt with Dr Tyrone Nine who recommends consult to cardiology, will also see the patient.  3:49 PM I spoke with on call cardiologist who agrees to consult.      5:22 PM Cardiology Dr Oval Linsey recommends admission.    Admitted to [facility], Dr Clementeen Graham agrees to admission.    MDM   Final diagnoses:  Generalized weakness  Lightheadedness  Dysuria  Elevated troponin   Pt p/w lightheadedness, generalized weakness x 1 day, dysuria x 2 days.  Unable to walk.  Initial troponin elevated at 0.09. CXR with new effusions, vascular congestion, BNP elevated, troponin elevated.  Pt seen by cardiology in consult, reported to likely be palliative treatment from cardiology standpoint.  [facility] admission.  Following admission pt found to have urinary retention, foley catheter placed.  UA does not appear infected.     Clayton Bibles, PA-C 12/22/15 [place], DO 12/22/15 1945

## 2015-12-22 NOTE — ED Notes (Signed)
Offered toileting to pt.  Pt reports he will unable to urinate.  Cardiology requests bladder scan.

## 2015-12-22 NOTE — ED Provider Notes (Signed)
CSN: [id number]     Arrival date & time 12/22/15  1325 History   First MD Initiated Contact with Patient 12/22/15 1335     No chief complaint on file.  (Consider location/radiation/quality/duration/timing/severity/associated sxs/prior Treatment) HPI Pt is an 82yo male brought to [facility] by his wife and daughter for evaluation of generalized weakness and difficulty urinating that started last night.  Wife states pt reported getting up last night to pee and felt off balance a few times.  Pt states he feels like he has to pee but cannot.  Family is concerned as he has a hx of aortic stenosis and hx of TIA.  Wife believes his Left side has been weaker than normal today and pt appears much weaker when ambulating.    Past Medical History  Diagnosis Date  . Arthritis   . SOB (shortness of breath) on exertion   . Hyperlipidemia   . Stroke [facility])     TIA history 2005  . Hyperglycemia 01/04/2012  . Leg pain, right 03/01/2012  . Right leg weakness 03/01/2012  . Skin lesion of right arm 09/14/2012  . Abrasion of hand 09/14/2012  . Cerumen impaction 09/14/2012  . Unsteady gait 12/26/2012  . Insomnia 12/26/2012  . Bilateral groin pain 02/13/2013    L>R  . Peripheral edema 03/29/2013  . Preventative health care 06/26/2013  . AAA (abdominal aortic aneurysm) ([place])   . Atrial fibrillation ([place])   . Aortic stenosis, severe 07/02/2015  . Depression 06/17/2015  . New onset a-fib ([place]) 06/17/2015  . Chronic diastolic congestive heart failure ([place])   . Hereditary and idiopathic peripheral neuropathy 08/12/2010  . SPINAL STENOSIS, LUMBAR 08/12/2010  . UNSTEADY GAIT 08/12/2010  . TIA 08/12/2010  . Coronary artery disease 08/01/2015    65% stenosis mid LAD   Past Surgical History  Procedure Laterality Date  . Thumb tendon severed and repaired      right, tendon snapped  . Carpal tunnel release      Right  . Achilles tendon repair      left, repaired w/ wire  . Brain surgery      done by Dr Lizabeth Leyden for tinnitius,  unsuccessful, only on right  . Inguinal herniorrhapy  years ago    right and then 2 on left  . Lumbar laminectomy      L3-L5 posterior laminectomy 2006  . Joint replacement      Bilateral total knee replacement  . Spine surgery    . Cardiac catheterization N/A 08/01/2015    Procedure: Right/Left Heart Cath and Coronary Angiography;  Surgeon: Burnell Blanks, MD;  Location: [facility];  Service: Cardiovascular;  Laterality: N/A;   Family History  Problem Relation Age of Onset  . Other Mother     CHF  . Ulcers Mother     Venous stasis  . Varicose Veins Mother   . Other Father     CHF  . Cancer Sister     unknown  . Hip fracture Maternal Grandmother   . Pneumonia Maternal Grandmother    Social History  Substance Use Topics  . Smoking status: Former Smoker -- 3.00 packs/day    Quit date: 10/16/1960  . Smokeless tobacco: Never Used  . Alcohol Use: 8.4 oz/week    14 Cans of beer per week     Comment: 1-2 beer daily    Review of Systems  Respiratory: Positive for shortness of breath.   Cardiovascular: Positive for leg swelling. Negative for chest pain  and palpitations.  Gastrointestinal: Negative for nausea, vomiting and abdominal pain.  Genitourinary: Positive for dysuria and difficulty urinating. Negative for hematuria.  Musculoskeletal: Negative for myalgias and arthralgias.    Allergies  Niacin and related and Omnipaque  Home Medications   Prior to Admission medications   Medication Sig Start Date End Date Taking? Authorizing Provider  bisacodyl (DULCOLAX) 5 MG EC tablet Take 5 mg by mouth 2 (two) times daily.    Historical Provider, MD  citalopram (CELEXA) 10 MG tablet Take 1 tablet (10 mg total) by mouth daily. 12/06/15   Mosie Lukes, MD  clopidogrel (PLAVIX) 75 MG tablet TAKE 1 TABLET BY MOUTH ATBEDTIME. 07/13/15   Mosie Lukes, MD  fish oil-omega-3 fatty acids 1000 MG capsule Take 2 g by mouth at bedtime.     Historical Provider, MD  ibuprofen  (ADVIL,MOTRIN) 200 MG tablet Take 200 mg by mouth daily as needed (pain).    Historical Provider, MD  LORazepam (ATIVAN) 0.5 MG tablet Take 1 tablet (0.5 mg total) by mouth at bedtime as needed for sleep. 08/20/15   Mosie Lukes, MD  Polyvinyl Alcohol-Povidone (REFRESH OP) Place 1 drop into both eyes daily as needed (itching/watery eyes).    Historical Provider, MD  simvastatin (ZOCOR) 40 MG tablet TAKE 1 TABLET BY MOUTH DAILY AT 6 PM. Patient taking differently: TAKE 1 TABLET BY MOUTH DAILY AT BEDTIME 07/13/15   Mosie Lukes, MD   Meds Ordered and Administered this Visit  Medications - No data to display  There were no vitals taken for this visit. No data found.   Physical Exam  Constitutional: He is oriented to person, place, and time. He appears well-developed and well-nourished.  Elderly male sitting in wheel chair, appears frail but is alert and oriented.   HENT:  Head: Normocephalic and atraumatic.  Mouth/Throat: Oropharynx is clear and moist.  Very hard of hearing.   Eyes: Conjunctivae are normal. No scleral icterus.  Neck: Normal range of motion.  Cardiovascular: Normal rate, regular rhythm and normal heart sounds.   Pulmonary/Chest: Effort normal and breath sounds normal. No respiratory distress. He has no wheezes. He has no rales. He exhibits no tenderness.  Abdominal: Soft. Bowel sounds are normal. He exhibits no distension and no mass. There is no tenderness. There is no rebound and no guarding.  Musculoskeletal: Normal range of motion.  Neurological: He is alert and oriented to person, place, and time.  Speech is clear. Smile is symmetric. Alert to person place and time. Strong grip strength 5/5 in Left hand vs Right.    Skin: Skin is warm and dry.  Nursing note and vitals reviewed.   ED Course  Procedures (including critical care time)  Labs Review Labs Reviewed - No data to display  Imaging Review No results found.    MDM   1. Difficulty urinating   2.  Generalized weakness   3. History of aortic stenosis   4. History of TIA (transient ischemic attack)    Pt is an 82yo male brought to [facility] by his wife and daughter for further evaluation of generalized weakness and difficulty urinating.  Pt has extensive PMH including severe aortic stenosis and TIA.    Pt not in acute distress at this time, however, due to [place] and reports of worsening weakness, recommend pt be seen in emergency department for further evaluation and treatment.  Family understanding and agreeable with plan for pt to be evaluated in emergency department. Family declined EMS  transport. Symptoms started last night, pt outside of TPA window, agree pt is stable for transport via POV.  Charge nurse at [employer] notified of pt's pending arrival.     Noland Fordyce, [place] 12/22/15 1401

## 2015-12-22 NOTE — ED Notes (Signed)
Cardiology at bedside.

## 2015-12-22 NOTE — ED Notes (Signed)
Patient presents to [facility] with C/O weakness on the left side worse since last PM, and urinary retention. Alert and oriented grips equal and strong, legs very weak requires a walker to ambulate.

## 2015-12-22 NOTE — H&P (Addendum)
[facility] History and Physical  Shawn Cortez [id number] DOB: 11-26-1924 DOA: 12/22/2015  Referring physician: ED PCP: Penni Homans, MD   Chief Complaint:  Weakness, confusion since one day  HPI:  82 year old male with history of TIA, AAA, paroxysmal A. fib, not on anticoagulation (likely due to is and unsteady gait with risk for fall), mild dementia, deconditioning, CAD, chronic diastolic CHF, severe aortic stenosis who presented to the ED with increased urinary frequency and generalized weakness since 1 day. As per wife and son at bedside who provided most of the history, patient was getting increasingly weak for past few weeks. Last night he was unable to sleep properly getting restless and going to the bathroom to urinate frequently. This morning he appeared somewhat confused also. He also noted that his left eye was weaker than usual and had difficulty walking with some unsteadiness. Normally he uses a walker and is able to ambulate from his room to the kitchen and to the bathroom only. He also complained of feeling slightly dizzy. Denies any headache, blurred vision, fever, chills, URI symptoms, chest pain, palpitations, shortness of breath, abdominal pain, dysuria, hematuria, diarrhea (had some 1 week ago). Complains of some pain in his bilateral thighs. Patient also has bilateral leg edema which the wife has increased over the past 1 month. Denies any orthopnea or PND. As per wife he has lost almost 30 pounds in the past 6 months. No sick contacts or recent travel. No change in medications recently.   Course in the ED Vitals were stable. Blood work done showed troponin of 0.10. EKG showed A. fib at 83 with PVCs and prolonged QTC of 528. Chest x-ray showed cardiomegaly with vascular congestion and small bilateral effusions. BNP was 456. Cardiology a hospitalist consulted and patient admitted on observation on telemetry.  Review of Systems:  As outlined in history of  present illness. 12 point review of systems otherwise unremarkable.   Past Medical History  Diagnosis Date  . Arthritis   . SOB (shortness of breath) on exertion   . Hyperlipidemia   . Stroke [facility])     TIA history 2005  . Hyperglycemia 01/04/2012  . Leg pain, right 03/01/2012  . Right leg weakness 03/01/2012  . Skin lesion of right arm 09/14/2012  . Abrasion of hand 09/14/2012  . Cerumen impaction 09/14/2012  . Unsteady gait 12/26/2012  . Insomnia 12/26/2012  . Bilateral groin pain 02/13/2013    L>R  . Peripheral edema 03/29/2013  . Preventative health care 06/26/2013  . AAA (abdominal aortic aneurysm) ([place])   . Atrial fibrillation ([place])   . Aortic stenosis, severe 07/02/2015  . Depression 06/17/2015  . New onset a-fib ([place]) 06/17/2015  . Chronic diastolic congestive heart failure ([place])   . Hereditary and idiopathic peripheral neuropathy 08/12/2010  . SPINAL STENOSIS, LUMBAR 08/12/2010  . UNSTEADY GAIT 08/12/2010  . TIA 08/12/2010  . Coronary artery disease 08/01/2015    65% stenosis mid LAD   Past Surgical History  Procedure Laterality Date  . Thumb tendon severed and repaired      right, tendon snapped  . Carpal tunnel release      Right  . Achilles tendon repair      left, repaired w/ wire  . Brain surgery      done by Dr Lizabeth Leyden for tinnitius, unsuccessful, only on right  . Inguinal herniorrhapy  years ago    right and then 2 on left  . Lumbar laminectomy  L3-L5 posterior laminectomy 2006  . Joint replacement      Bilateral total knee replacement  . Spine surgery    . Cardiac catheterization N/A 08/01/2015    Procedure: Right/Left Heart Cath and Coronary Angiography;  Surgeon: Burnell Blanks, MD;  Location: [facility];  Service: Cardiovascular;  Laterality: N/A;   Social History:  reports that he quit smoking about 55 years ago. He has never used smokeless tobacco. He reports that he drinks about 8.4 oz of alcohol per week. He reports that he does not use  illicit drugs.  Allergies  Allergen Reactions  . Niacin And Related Rash    Facial redness and Rash head to toe  . Omnipaque [Iohexol] Rash    Facial redness and flushing    Family History  Problem Relation Age of Onset  . Other Mother     CHF  . Ulcers Mother     Venous stasis  . Varicose Veins Mother   . Other Father     CHF  . Cancer Sister     unknown  . Hip fracture Maternal Grandmother   . Pneumonia Maternal Grandmother     Prior to Admission medications   Medication Sig Start Date End Date Taking? Authorizing Provider  acetaminophen (TYLENOL) 500 MG tablet Take 500 mg by mouth every 8 (eight) hours as needed for mild pain or moderate pain.   Yes Historical Provider, MD  bisacodyl (DULCOLAX) 5 MG EC tablet Take 5 mg by mouth at bedtime as needed for mild constipation.    Yes Historical Provider, MD  clopidogrel (PLAVIX) 75 MG tablet TAKE 1 TABLET BY MOUTH ATBEDTIME. 07/13/15  Yes Mosie Lukes, MD  fish oil-omega-3 fatty acids 1000 MG capsule Take 2 g by mouth at bedtime.    Yes Historical Provider, MD  ibuprofen (ADVIL,MOTRIN) 200 MG tablet Take 200 mg by mouth daily as needed (pain).   Yes Historical Provider, MD  LORazepam (ATIVAN) 0.5 MG tablet Take 1 tablet (0.5 mg total) by mouth at bedtime as needed for sleep. 08/20/15  Yes Mosie Lukes, MD  Nutritional Supplements (NUTRITIONAL SHAKE PLUS PROTEIN PO) Take 1 Can by mouth daily at 12 noon.   Yes Historical Provider, MD  polyethylene glycol (MIRALAX / GLYCOLAX) packet Take 17 g by mouth at bedtime as needed for mild constipation.   Yes Historical Provider, MD  Polyvinyl Alcohol-Povidone (REFRESH OP) Place 1 drop into both eyes daily as needed (itching/watery eyes).   Yes Historical Provider, MD  Simethicone (GAS-X PO) Take 1 tablet by mouth as needed.   Yes Historical Provider, MD  simvastatin (ZOCOR) 40 MG tablet TAKE 1 TABLET BY MOUTH DAILY AT 6 PM. Patient taking differently: TAKE 1 TABLET BY MOUTH DAILY AT  BEDTIME 07/13/15  Yes Mosie Lukes, MD  citalopram (CELEXA) 10 MG tablet Take 1 tablet (10 mg total) by mouth daily. Patient not taking: Reported on 12/22/2015 12/06/15   Mosie Lukes, MD     Physical Exam:  Filed Vitals:   12/22/15 1615 12/22/15 1630 12/22/15 1645 12/22/15 1700  BP: 109/77 133/75 122/73 113/81  Pulse: 60 74 81 73  Temp:      TempSrc:      Resp:    11  Height:      Weight:      SpO2: 97% 98% 97% 97%    Constitutional: Vital signs reviewed. Elderly male lying in bed not in distress, hard of hearing HEENT: no pallor, no icterus,  moist oral mucosa, no cervical lymphadenopathy, no JVD, mild temporal wasting Cardiovascular: RRR, S1 and S2 irregular, 3/6 systolic murmur Chest: CTAB, no wheezes, rales, or rhonchi Abdominal: Soft. Non-tender, non-distended, bowel sounds are normal,  Ext: warm, plus pitting edema bilaterally  Neurological: Alert and awake, oriented x 2, very slow to answer questions, weakness in bilateral lower leg, no focal deficit,  Labs on Admission:  Basic Metabolic Panel:  Recent Labs Lab 12/22/15 1450  NA 135  K 4.2  CL 100*  CO2 23  GLUCOSE 98  BUN 21*  CREATININE 0.84  CALCIUM 8.9   Liver Function Tests: No results for input(s): AST, ALT, ALKPHOS, BILITOT, PROT, ALBUMIN in the last 168 hours. No results for input(s): LIPASE, AMYLASE in the last 168 hours. No results for input(s): AMMONIA in the last 168 hours. CBC:  Recent Labs Lab 12/22/15 1450  WBC 4.1  HGB 12.7*  HCT 38.3*  MCV 92.1  PLT 159   Cardiac Enzymes:  Recent Labs Lab 12/22/15 1450  TROPONINI 0.10*   BNP: Invalid input(s): POCBNP CBG: No results for input(s): GLUCAP in the last 168 hours.  Radiological Exams on Admission: Dg Chest 2 View  12/22/2015  CLINICAL DATA:  Bilateral leg weakness.  Shortness of breath. EXAM: CHEST  2 VIEW COMPARISON:  Chest CT 08/13/2015 FINDINGS: There are small bilateral pleural effusions with bibasilar opacities, likely  atelectasis. Cardiomegaly with vascular congestion. No overt edema. No acute bony abnormality. IMPRESSION: Cardiomegaly with vascular congestion, small bilateral effusions and bibasilar atelectasis. Electronically Signed   By: Rolm Baptise M.D.   On: 12/22/2015 15:46    EKG: A. fib at 83, PVCs, prolonged QTC of 528.  Assessment/Plan  Principal Problem:   Weakness generalized Appears to be progressive worsening with possible underlying UTI. Admit on telemetry on observation. Check UA and urine culture. PT and dietitian consult. Patient recently moved with his wife into [facility] [place] at [place] (independent living). May benefit from going to short term skilled nursing upon discharge. Wife and son agree.   Active Problems: Acute on chronic diastolic CHF Possibly contributed by severe aortic stenosis. Will place on Lasix 20 mg IV twice a day. Monitor strict I/O and daily weight.  Severe aortic stenosis Ideally needs aortic valve replacement but per cardiology note his family have chosen for palliation.    Elevated troponin Has underlying coronary artery disease. No EKG changes or chest pain symptoms. No aggressive workup recommended per cardiology. Also family not interested in cardiac cath. Would not cycle enzymes further. Continue Plavix and statin. LHC in October 2016 showed 65% LAD lesion.   A. fib Rate controlled. Not an anticoagulation likely due to fall risk. Continue Plavix.    DVT prophylaxis: Subcutaneous Lovenox Diet: Heart healthy     Code Status: DNR Family Communication: discussed with wife and son at bedside in detail Disposition Plan: Admit to telemetry.  Louellen Molder [facility] Pager [phone]  Total time spent on admission :60 minutes  If 7PM-7AM, please contact night-coverage [email or web address] Password [facility] 12/22/2015, 6:04 PM

## 2015-12-23 DIAGNOSIS — Z87891 Personal history of nicotine dependence: Secondary | ICD-10-CM | POA: Diagnosis not present

## 2015-12-23 DIAGNOSIS — I493 Ventricular premature depolarization: Secondary | ICD-10-CM | POA: Diagnosis present

## 2015-12-23 DIAGNOSIS — R531 Weakness: Secondary | ICD-10-CM | POA: Diagnosis not present

## 2015-12-23 DIAGNOSIS — I350 Nonrheumatic aortic (valve) stenosis: Secondary | ICD-10-CM | POA: Diagnosis not present

## 2015-12-23 DIAGNOSIS — R339 Retention of urine, unspecified: Secondary | ICD-10-CM | POA: Diagnosis present

## 2015-12-23 DIAGNOSIS — I5032 Chronic diastolic (congestive) heart failure: Secondary | ICD-10-CM

## 2015-12-23 DIAGNOSIS — R0600 Dyspnea, unspecified: Secondary | ICD-10-CM | POA: Diagnosis not present

## 2015-12-23 DIAGNOSIS — Z7902 Long term (current) use of antithrombotics/antiplatelets: Secondary | ICD-10-CM | POA: Diagnosis not present

## 2015-12-23 DIAGNOSIS — Z9181 History of falling: Secondary | ICD-10-CM | POA: Diagnosis not present

## 2015-12-23 DIAGNOSIS — T502X5A Adverse effect of carbonic-anhydrase inhibitors, benzothiadiazides and other diuretics, initial encounter: Secondary | ICD-10-CM | POA: Diagnosis not present

## 2015-12-23 DIAGNOSIS — E876 Hypokalemia: Secondary | ICD-10-CM | POA: Diagnosis not present

## 2015-12-23 DIAGNOSIS — Z515 Encounter for palliative care: Secondary | ICD-10-CM | POA: Insufficient documentation

## 2015-12-23 DIAGNOSIS — I2510 Atherosclerotic heart disease of native coronary artery without angina pectoris: Secondary | ICD-10-CM | POA: Diagnosis present

## 2015-12-23 DIAGNOSIS — R262 Difficulty in walking, not elsewhere classified: Secondary | ICD-10-CM | POA: Diagnosis present

## 2015-12-23 DIAGNOSIS — Z66 Do not resuscitate: Secondary | ICD-10-CM | POA: Diagnosis present

## 2015-12-23 DIAGNOSIS — R269 Unspecified abnormalities of gait and mobility: Secondary | ICD-10-CM | POA: Diagnosis not present

## 2015-12-23 DIAGNOSIS — R748 Abnormal levels of other serum enzymes: Secondary | ICD-10-CM | POA: Diagnosis present

## 2015-12-23 DIAGNOSIS — G4700 Insomnia, unspecified: Secondary | ICD-10-CM | POA: Diagnosis present

## 2015-12-23 DIAGNOSIS — G609 Hereditary and idiopathic neuropathy, unspecified: Secondary | ICD-10-CM | POA: Diagnosis present

## 2015-12-23 DIAGNOSIS — R300 Dysuria: Secondary | ICD-10-CM | POA: Diagnosis present

## 2015-12-23 DIAGNOSIS — I4510 Unspecified right bundle-branch block: Secondary | ICD-10-CM | POA: Diagnosis present

## 2015-12-23 DIAGNOSIS — E785 Hyperlipidemia, unspecified: Secondary | ICD-10-CM | POA: Diagnosis present

## 2015-12-23 DIAGNOSIS — I5033 Acute on chronic diastolic (congestive) heart failure: Principal | ICD-10-CM

## 2015-12-23 DIAGNOSIS — I480 Paroxysmal atrial fibrillation: Secondary | ICD-10-CM | POA: Diagnosis present

## 2015-12-23 DIAGNOSIS — I25110 Atherosclerotic heart disease of native coronary artery with unstable angina pectoris: Secondary | ICD-10-CM

## 2015-12-23 DIAGNOSIS — E440 Moderate protein-calorie malnutrition: Secondary | ICD-10-CM | POA: Diagnosis present

## 2015-12-23 DIAGNOSIS — R350 Frequency of micturition: Secondary | ICD-10-CM | POA: Diagnosis present

## 2015-12-23 DIAGNOSIS — F0390 Unspecified dementia without behavioral disturbance: Secondary | ICD-10-CM | POA: Diagnosis present

## 2015-12-23 DIAGNOSIS — Z6822 Body mass index (BMI) 22.0-22.9, adult: Secondary | ICD-10-CM | POA: Diagnosis not present

## 2015-12-23 DIAGNOSIS — Z8673 Personal history of transient ischemic attack (TIA), and cerebral infarction without residual deficits: Secondary | ICD-10-CM | POA: Diagnosis not present

## 2015-12-23 DIAGNOSIS — R451 Restlessness and agitation: Secondary | ICD-10-CM | POA: Diagnosis present

## 2015-12-23 LAB — BASIC METABOLIC PANEL
Anion gap: 10 (ref 5–15)
BUN: 18 mg/dL (ref 6–20)
CHLORIDE: 101 mmol/L (ref 101–111)
CO2: 25 mmol/L (ref 22–32)
CREATININE: 0.99 mg/dL (ref 0.61–1.24)
Calcium: 8.6 mg/dL — ABNORMAL LOW (ref 8.9–10.3)
GFR calc non Af Amer: >60 mL/min (ref >60)
GLUCOSE: 86 mg/dL (ref 65–99)
Potassium: 3.9 mmol/L (ref 3.5–5.1)
Sodium: 136 mmol/L (ref 135–145)

## 2015-12-23 MED ORDER — DIPHENHYDRAMINE HCL 25 MG PO CAPS: 25.0000 mg | ORAL_CAPSULE | Freq: Every evening | ORAL | Status: DC | PRN

## 2015-12-23 MED ORDER — LORAZEPAM 1 MG PO TABS: 1.0000 mg | ORAL_TABLET | Freq: Every evening | ORAL | Status: DC | PRN

## 2015-12-23 MED ORDER — ZOLPIDEM TARTRATE 5 MG PO TABS: 5.0000 mg | ORAL_TABLET | Freq: Every evening | ORAL | Status: DC | PRN

## 2015-12-23 MED ORDER — TAMSULOSIN HCL 0.4 MG PO CAPS
0.4000 mg | ORAL_CAPSULE | Freq: Every day | ORAL | Status: DC
  Administered 2015-12-23 – 2015-12-25 (×3): 0.4 mg via ORAL
  Filled 2015-12-23 (×3): qty 1

## 2015-12-23 MED ORDER — QUETIAPINE FUMARATE 25 MG PO TABS
12.5000 mg | ORAL_TABLET | Freq: Every day | ORAL | Status: DC
  Administered 2015-12-23: 12.5 mg via ORAL
  Filled 2015-12-23: qty 1

## 2015-12-23 MED ORDER — MORPHINE SULFATE (CONCENTRATE) 10 MG/0.5ML PO SOLN: 5.0000 mg | ORAL | Status: DC | PRN

## 2015-12-23 NOTE — Evaluation (Signed)
Physical Therapy Evaluation Patient Details Name: Shawn Cortez MRN: [id number] DOB: 27-Jul-1925 Today's Date: 12/23/2015   History of Present Illness  Patient is an 82 yo male admitted 12/22/15 with increasing weakness and confusion. Patient with CHF exacerbation and severe AS.    PMH:  TIA, PAF, dementia, CAD, CHF, AS, peripheral neuropathy  Clinical Impression  Patient presents with problems listed below.  Will benefit from acute PT to maximize functional mobility prior to discharge.  Patient requiring mod to max assist for mobility, and unable to ambulate.  Recommend SNF at discharge for continued therapy.    Follow Up Recommendations SNF;Supervision/Assistance - 24 hour    Equipment Recommendations  None recommended by PT    Recommendations for Other Services       Precautions / Restrictions Precautions Precautions: Fall Restrictions Weight Bearing Restrictions: No      Mobility  Bed Mobility Overal bed mobility: Needs Assistance Bed Mobility: Supine to Sit;Sit to Supine     Supine to sit: Min assist Sit to supine: Mod assist   General bed mobility comments: Verbal cues for technique.  Assist to raise trunk to sitting position and scoot to EOB.  Required mod assist to lower trunk and bring LE's onto bed to return to supine.  Transfers Overall transfer level: Needs assistance Equipment used: Rolling walker (2 wheeled) Transfers: Sit to/from Stand Sit to Stand: Max assist         General transfer comment: Verbal cues for hand placement.  Patient uses rocking motion to initiate transfer.  Max assist to power up to standing.  Patient with significant posterior lean, requiring assist to maintain standing balance.  Patient stood for 30-45 seconds x2.  Unable to take steps due to LE weakness.  Ambulation/Gait                Stairs            Wheelchair Mobility    Modified Rankin (Stroke Patients Only)       Balance Overall balance assessment:  Needs assistance Sitting-balance support: No upper extremity supported;Feet supported Sitting balance-Leahy Scale: Good     Standing balance support: Bilateral upper extremity supported Standing balance-Leahy Scale: Poor Standing balance comment: Posterior lean                             Pertinent Vitals/Pain Pain Assessment: No/denies pain    Home Living Family/patient expects to be discharged to:: Skilled nursing facility Living Arrangements: Spouse/significant other Available Help at Discharge: Family;Available 24 hours/day (Wife) Type of Home: Independent living facility         Home Equipment: Gilford Rile - 2 wheels;Wheelchair - manual      Prior Function Level of Independence: Needs assistance   Gait / Transfers Assistance Needed: Patient ambulates short distances in apartment with assist.  Wife pushes patient in w/c to dining room for meals at ILF.  ADL's / Homemaking Assistance Needed: Assist with all ADL's        Hand Dominance   Dominant Hand: Right    Extremity/Trunk Assessment   Upper Extremity Assessment: Generalized weakness (Peripheral neuropathy Bil hands)           Lower Extremity Assessment: RLE deficits/detail;LLE deficits/detail RLE Deficits / Details: Strength grossly 3-/5; edema noted foot and lower leg LLE Deficits / Details: Strength grossly 3/5; edema noted lower leg/foot  Cervical / Trunk Assessment: Kyphotic;Other exceptions  Communication   Communication: HOH  Cognition Arousal/Alertness:  Awake/alert Behavior During Therapy: WFL for tasks assessed/performed;Flat affect Overall Cognitive Status: History of cognitive impairments - at baseline                      General Comments      Exercises        Assessment/Plan    PT Assessment Patient needs continued PT services  PT Diagnosis Difficulty walking;Generalized weakness;Altered mental status   PT Problem List Decreased strength;Decreased activity  tolerance;Decreased balance;Decreased mobility;Decreased cognition;Decreased knowledge of use of DME;Impaired sensation  PT Treatment Interventions DME instruction;Gait training;Functional mobility training;Therapeutic activities;Therapeutic exercise;Balance training;Cognitive remediation;Patient/family education   PT Goals (Current goals can be found in the Care Plan section) Acute Rehab PT Goals Patient Stated Goal: To get stronger PT Goal Formulation: With patient/family Time For Goal Achievement: 01/06/16 Potential to Achieve Goals: Good    Frequency Min 3X/week   Barriers to discharge Decreased caregiver support Do not feel wife can provide assist needed at this time.    Co-evaluation               End of Session Equipment Utilized During Treatment: Gait belt Activity Tolerance: Patient limited by fatigue Patient left: in bed;with call bell/phone within reach;with bed alarm set;with family/visitor present Nurse Communication: Mobility status    Functional Assessment Tool Used: Clinical judgement Functional Limitation: Mobility: Walking and moving around Mobility: Walking and Moving Around Current Status [id number]): At least 60 percent but less than 80 percent impaired, limited or restricted Mobility: Walking and Moving Around Goal Status [phone]): At least 20 percent but less than 40 percent impaired, limited or restricted    Time: 1223-1254 PT Time Calculation (min) (ACUTE ONLY): 31 min   Charges:   PT Evaluation $PT Eval Moderate Complexity: 1 Procedure PT Treatments $Therapeutic Activity: 8-22 mins   PT G Codes:   PT G-Codes **NOT FOR INPATIENT CLASS** Functional Assessment Tool Used: Clinical judgement Functional Limitation: Mobility: Walking and moving around Mobility: Walking and Moving Around Current Status [id number]): At least 60 percent but less than 80 percent impaired, limited or restricted Mobility: Walking and Moving Around Goal Status [phone]): At least 20  percent but less than 40 percent impaired, limited or restricted    Despina Pole 12/23/2015, 2:21 PM Carita Pian. Sanjuana Kava, [place] Pager [phone]

## 2015-12-23 NOTE — Progress Notes (Signed)
PATIENT ID:  [id number] with chronic diastolic heart failure, CAD, paroxysmal atrial fibrillation, TIAs, gait disturbance, dementia, severe deconditioning, and severe AS here with weakness and increased urinary frequency.  INTERVAL HISTORY: Shawn Cortez had a difficult night.  He was awake frequently and reports needing to urinate.  He does not understand that he has a catheter in place.  SUBJECTIVE:  Feeling well.  Denies chest pain or shortness of breath.   PHYSICAL EXAM Filed Vitals:   12/22/15 1915 12/22/15 2106 12/22/15 2346 12/23/15 0401  BP: 114/71 111/93 119/88 98/77  Pulse: 79 87 81 74  Temp:  97.7 F (36.5 C) 97.8 F (36.6 C) 98.4 F (36.9 C)  TempSrc:  Oral Oral Oral  Resp: 13 16 18 18   Height:  6\' 3"  (1.905 m)    Weight:  82.146 kg (181 lb 1.6 oz)  85.322 kg (188 lb 1.6 oz)  SpO2: 97% 97% 98% 93%   General:  Frail, elderly man.  No acute distress.  Easily confused but able to respond appropriately to questions.  Neck: JVD 1 cm above clavicle at 45 degrees Lungs:  CTAB Heart:  RRR.  III/VI late-peaking systolic murmur at the RUSB.  No r/g. Abdomen:  Soft, NT ND.  +BS Extremities:  WWP.  2+ pitting edema to the mid tibia bilaterally.  LABS: Lab Results  Component Value Date   TROPONINI 0.10* 12/22/2015   Results for orders placed or performed during the hospital encounter of 12/22/15 (from the past 24 hour(s))  Basic metabolic panel     Status: Abnormal   Collection Time: 12/22/15  2:50 PM  Result Value Ref Range   Sodium 135 135 - 145 mmol/L   Potassium 4.2 3.5 - 5.1 mmol/L   Chloride 100 (L) 101 - 111 mmol/L   CO2 23 22 - 32 mmol/L   Glucose, Bld 98 65 - 99 mg/dL   BUN 21 (H) 6 - 20 mg/dL   Creatinine, Ser 0.84 0.61 - 1.24 mg/dL   Calcium 8.9 8.9 - 10.3 mg/dL   GFR calc non Af Amer >60 >60 mL/min   GFR calc Af Amer >60 >60 mL/min   Anion gap 12 5 - 15  CBC     Status: Abnormal   Collection Time: 12/22/15  2:50 PM  Result Value Ref Range   WBC 4.1 4.0 -  10.5 K/uL   RBC 4.16 (L) 4.22 - 5.81 MIL/uL   Hemoglobin 12.7 (L) 13.0 - 17.0 g/dL   HCT 38.3 (L) 39.0 - 52.0 %   MCV 92.1 78.0 - 100.0 fL   MCH 30.5 26.0 - 34.0 pg   MCHC 33.2 30.0 - 36.0 g/dL   RDW 14.7 11.5 - 15.5 %   Platelets 159 150 - 400 K/uL  Troponin I     Status: Abnormal   Collection Time: 12/22/15  2:50 PM  Result Value Ref Range   Troponin I 0.10 (H) <0.031 ng/mL  I-stat troponin, ED (not at [facility], [facility])     Status: Abnormal   Collection Time: 12/22/15  3:04 PM  Result Value Ref Range   Troponin i, poc 0.09 (HH) 0.00 - 0.08 ng/mL   Comment NOTIFIED PHYSICIAN    Comment 3          Brain natriuretic peptide     Status: Abnormal   Collection Time: 12/22/15  3:11 PM  Result Value Ref Range   B Natriuretic Peptide 456.5 (H) 0.0 - 100.0 pg/mL  Urinalysis, Routine w reflex microscopic  Status: None   Collection Time: 12/22/15  5:59 PM  Result Value Ref Range   Color, Urine YELLOW YELLOW   APPearance CLEAR CLEAR   Specific Gravity, Urine 1.021 1.005 - 1.030   pH 5.0 5.0 - 8.0   Glucose, UA NEGATIVE NEGATIVE mg/dL   Hgb urine dipstick NEGATIVE NEGATIVE   Bilirubin Urine NEGATIVE NEGATIVE   Ketones, ur NEGATIVE NEGATIVE mg/dL   Protein, ur NEGATIVE NEGATIVE mg/dL   Nitrite NEGATIVE NEGATIVE   Leukocytes, UA NEGATIVE NEGATIVE  CBC     Status: Abnormal   Collection Time: 12/22/15 10:20 PM  Result Value Ref Range   WBC 3.5 (L) 4.0 - 10.5 K/uL   RBC 4.06 (L) 4.22 - 5.81 MIL/uL   Hemoglobin 12.4 (L) 13.0 - 17.0 g/dL   HCT 37.7 (L) 39.0 - 52.0 %   MCV 92.9 78.0 - 100.0 fL   MCH 30.5 26.0 - 34.0 pg   MCHC 32.9 30.0 - 36.0 g/dL   RDW 14.7 11.5 - 15.5 %   Platelets 154 150 - 400 K/uL  Creatinine, serum     Status: None   Collection Time: 12/22/15 10:20 PM  Result Value Ref Range   Creatinine, Ser 0.83 0.61 - 1.24 mg/dL   GFR calc non Af Amer >60 >60 mL/min   GFR calc Af Amer >60 >60 mL/min  Basic metabolic panel     Status: Abnormal   Collection Time: 12/23/15  5:29  AM  Result Value Ref Range   Sodium 136 135 - 145 mmol/L   Potassium 3.9 3.5 - 5.1 mmol/L   Chloride 101 101 - 111 mmol/L   CO2 25 22 - 32 mmol/L   Glucose, Bld 86 65 - 99 mg/dL   BUN 18 6 - 20 mg/dL   Creatinine, Ser 0.99 0.61 - 1.24 mg/dL   Calcium 8.6 (L) 8.9 - 10.3 mg/dL   GFR calc non Af Amer >60 >60 mL/min   GFR calc Af Amer >60 >60 mL/min   Anion gap 10 5 - 15    Intake/Output Summary (Last 24 hours) at 12/23/15 1229 Last data filed at 12/22/15 1815  Gross per 24 hour  Intake      0 ml  Output    425 ml  Net   -425 ml    Telemetry:  Sinus rhythm with occasional PVCs.  ASSESSMENT AND PLAN:  Principal Problem:   Weakness generalized Active Problems:   Aortic stenosis, severe   UNSTEADY GAIT   Peripheral edema   Chronic diastolic congestive heart failure (HCC)   Coronary artery disease   Acute on chronic diastolic (congestive) heart failure (HCC)   Generalized weakness   # Severe aortic stenosis: Shawn Cortez is a candidate for TAVR but he and his family choose palliation.  He was very confused and had a hard time remembering to keep his arm straight or why he was in the hospital after his cardiac catheterization.  They think that a TAVR and ICU stay will be too much for him. This seems reasonable. We will attempt to optimize his volume status. - Increase lasix to 40 mg IV bid.  Weight is up 1lb and he continues to have LE edema - Palliative Care consult- to see him this afternoon.  Appreciate their assistance. Insomnia, restlessness and feeling of need to urinate are his main complaints at this time.  Without aortic valve replacement life expectancy is <1 year.  # CAD, elevated troponin: Cardiac cath revealed a 65% LAD lesion  that could be the cause of the elevated troponin. However, without significant EKG changes or chest pain, this seems unlikely. Also, they are not interested in cardiac cath. Continue plavix for now. However, could consider stopping this given  his goals of care.  # Atrial fibrillation: Shawn Cortez remains in sinus rhythm. Not on anticoagulation given that he is on plavix and is a fall risk. He has not been requiring any rate control as an outpatient.  # Hyperlipidemia: Given that we are pursuing a palliative approach, stopped statin.  # Increased urinary frequency: Urinalysis is negative for infection.  His daughter reports that the nurse who inserted the foley in the ED thought his prostate was enlarged.  He may benefit from finasteride.   Time spent: 40 minutes-Greater than 50% of this time was spent in counseling, explanation of diagnosis, planning of further management, and coordination of care.    Shawn Cortez C. Oval Linsey, MD, [facility] 12/23/2015 12:29 PM

## 2015-12-23 NOTE — Consult Note (Signed)
Consultation Note Date: 12/23/2015   Patient Name: Shawn Cortez  DOB: 01/09/1925  MRN: [id number]  Age / Sex: 82 y.o., male  PCP: Mosie Lukes, MD Referring Physician: Cristal Ford, DO  Reason for Consultation: Establishing goals of care    Clinical Assessment/Narrative: Patient is a 82 year old gentleman with a past medical history significant for TIA, abdominal aortic aneurysm, paroxysmal atrial fibrillation, history of mild dementia and generalized deconditioning. Patient also has chronic diastolic dysfunction and coronary artery disease. Patient also has severe aortic stenosis. Patient has been admitted with increased urinary frequency and generalized weakness. Patient has been admitted with ongoing weakness and confusion. Patient has been diagnosed with acute on chronic diastolic heart failure exacerbation. His chest x-ray showed vascular congestion. He is being diuresed with Lasix. He is known to have severe aortic stenosis. Patient and family have declined valvular procedure. Patient has history of atrial fibrillation as well.  Palliative care consult for goals of care discussions. Patient is an elderly Jomed resting in bed. He is able to answer a few questions appropriately. As per family's preference, family meeting held outside the room.  Met with the patient's wife, they have been married for 50+ years. Met with patient's son, daughter-in-law, 2 daughters. Patient has 3 children. Patient worked in Product [employer]. Patient is originally from [place], [place]. Patient has been living in [place], [place] for the past 10 years. One son and one daughter lives locally, another daughter lives in [place] but is visiting.  Introduced scope of palliative care. Goals of care attempted to be elicited. Discussions initially centered about aggressive symptom management. Discussed about adding  low-dose as needed Roxanol to be made available for shortness of breath or chest discomfort. Discussed about patient's sundowning, agitation at night. Recommended low-dose stroke call. Patient already on Ativan 1 mg and agree with use of Ativan. Patient already on bowel regimen. Family asking for a heating pad.  Patient has had ongoing weight loss. He has unintentionally lost 30 pounds in the last 9 months. He does not have an appetite. He eats very little. He complains of peripheral neuropathy in his both upper extremities his hands and upper extremity digits on both sides feel numb. He had a few teeth removed in May 2016 is in family has noticed memory loss short-term since then. At times, he even forgets his family members names.  It is noted that the patient has mentioned to family members that he does not think he has is longer to live. He has repeatedly asked them to get his affairs in order.  Patient came from [facility]. He will likely need at least assisted living support towards the end of this hospitalization. Will recommend hospice as an extra layer of support. Discussed about symptom management and having a plan in place to get the patient to a hospice facility if indicated for aggressive symptom management at end-of-life. All questions answered to the best of my ability.  Contacts/Participants in Discussion: Primary Decision Maker:    Relationship to Patient  HCPOA: yes  Wife, adult children.  SUMMARY OF RECOMMENDATIONS: Agree with DO NOT RESUSCITATE/DO NOT INTUBATE Symptom management: Will add low-dose Roxanol when necessary, continue Ativan, bilateral low-dose stroke: Monitor. Recommend addition of hospice services when the patient is discharged.   Thank you for the consult palliative will continue to follow along.   Code Status/Advance Care Planning: DNR    Code Status Orders        Start  Ordered   12/22/15 2148  Do not attempt resuscitation  (DNR)   Continuous    Question Answer Comment  In the event of cardiac or respiratory ARREST Do not call a "code blue"   In the event of cardiac or respiratory ARREST Do not perform Intubation, CPR, defibrillation or ACLS   In the event of cardiac or respiratory ARREST Use medication by any route, position, wound care, and other measures to relive pain and suffering. May use oxygen, suction and manual treatment of airway obstruction as needed for comfort.      12/22/15 2147    Code Status History    Date Active Date Inactive Code Status Order ID Comments User Context   12/22/2015  6:21 PM 12/22/2015  9:47 PM DNR [id number]  Louellen Molder, MD ED   08/01/2015 11:09 AM 08/01/2015  7:12 PM Full Code [id number]  Burnell Blanks, MD Inpatient    Advance Directive Documentation        Most Recent Value   Type of Advance Directive  Living will, Healthcare Power of Attorney   Pre-existing out of facility DNR order (yellow form or pink MOST form)     "MOST" Form in Place?        Other Directives:None  Symptom Management:   As above  Palliative Prophylaxis:   Delirium Protocol   Psycho-social/Spiritual:  Support System: Strong Desire for further Chaplaincy support:no Additional Recommendations: Caregiving  Support/Resources  Prognosis: Few weeks-few months  Discharge Planning: [facility] with Hospice   Chief Complaint/ Primary Diagnoses: Present on Admission:  . Peripheral edema . Coronary artery disease . Chronic diastolic congestive heart failure ([place]) . Acute on chronic diastolic (congestive) heart failure ([place])  I have reviewed the medical record, interviewed the patient and family, and examined the patient. The following aspects are pertinent.  Past Medical History  Diagnosis Date  . Arthritis   . SOB (shortness of breath) on exertion   . Hyperlipidemia   . Stroke [facility])     TIA history 2005  . Hyperglycemia 01/04/2012  . Leg pain, right 03/01/2012    . Right leg weakness 03/01/2012  . Skin lesion of right arm 09/14/2012  . Abrasion of hand 09/14/2012  . Cerumen impaction 09/14/2012  . Unsteady gait 12/26/2012  . Insomnia 12/26/2012  . Bilateral groin pain 02/13/2013    L>R  . Peripheral edema 03/29/2013  . Preventative health care 06/26/2013  . AAA (abdominal aortic aneurysm) ([place])   . Atrial fibrillation ([place])   . Aortic stenosis, severe 07/02/2015  . Depression 06/17/2015  . New onset a-fib ([place]) 06/17/2015  . Chronic diastolic congestive heart failure ([place])   . Hereditary and idiopathic peripheral neuropathy 08/12/2010  . SPINAL STENOSIS, LUMBAR 08/12/2010  . UNSTEADY GAIT 08/12/2010  . TIA 08/12/2010  . Coronary artery disease 08/01/2015    65% stenosis mid LAD   Social History   Social History  . Marital Status: Married    Spouse Name: N/A  . Number of Children: N/A  . Years of Education: N/A   Occupational History  . Retired    Social History Main Topics  . Smoking status: Former Smoker -- 3.00 packs/day    Quit date: 10/16/1960  . Smokeless tobacco: Never Used  . Alcohol Use: 8.4 oz/week    14 Cans of beer per week     Comment: 1-2 beer daily  . Drug Use: No  . Sexual Activity: Not Asked   Other Topics Concern  . None  Social History Narrative   Family History  Problem Relation Age of Onset  . Other Mother     CHF  . Ulcers Mother     Venous stasis  . Varicose Veins Mother   . Other Father     CHF  . Cancer Sister     unknown  . Hip fracture Maternal Grandmother   . Pneumonia Maternal Grandmother    Scheduled Meds: . clopidogrel  75 mg Oral Daily  . enoxaparin (LOVENOX) injection  40 mg Subcutaneous Q24H  . feeding supplement   Oral Q1200  . furosemide  20 mg Intravenous Q12H  . hydroxypropyl methylcellulose / hypromellose  1 drop Both Eyes BID  . omega-3 acid ethyl esters  2 g Oral QHS  . QUEtiapine  12.5 mg Oral QHS  . simvastatin  40 mg Oral QHS  . sodium chloride flush  3 mL Intravenous Q12H   . tamsulosin  0.4 mg Oral Daily   Continuous Infusions:  PRN Meds:.acetaminophen **OR** acetaminophen, bisacodyl, ibuprofen, LORazepam, morphine CONCENTRATE, ondansetron **OR** ondansetron (ZOFRAN) IV, polyethylene glycol, simethicone Medications Prior to Admission:  Prior to Admission medications   Medication Sig Start Date End Date Taking? Authorizing Provider  acetaminophen (TYLENOL) 500 MG tablet Take 500 mg by mouth every 8 (eight) hours as needed for mild pain or moderate pain.   Yes Historical Provider, MD  bisacodyl (DULCOLAX) 5 MG EC tablet Take 5 mg by mouth at bedtime as needed for mild constipation.    Yes Historical Provider, MD  clopidogrel (PLAVIX) 75 MG tablet TAKE 1 TABLET BY MOUTH ATBEDTIME. 07/13/15  Yes Mosie Lukes, MD  fish oil-omega-3 fatty acids 1000 MG capsule Take 2 g by mouth at bedtime.    Yes Historical Provider, MD  ibuprofen (ADVIL,MOTRIN) 200 MG tablet Take 200 mg by mouth daily as needed (pain).   Yes Historical Provider, MD  LORazepam (ATIVAN) 0.5 MG tablet Take 1 tablet (0.5 mg total) by mouth at bedtime as needed for sleep. 08/20/15  Yes Mosie Lukes, MD  Nutritional Supplements (NUTRITIONAL SHAKE PLUS PROTEIN PO) Take 1 Can by mouth daily at 12 noon.   Yes Historical Provider, MD  polyethylene glycol (MIRALAX / GLYCOLAX) packet Take 17 g by mouth at bedtime as needed for mild constipation.   Yes Historical Provider, MD  Polyvinyl Alcohol-Povidone (REFRESH OP) Place 1 drop into both eyes daily as needed (itching/watery eyes).   Yes Historical Provider, MD  Simethicone (GAS-X PO) Take 1 tablet by mouth as needed.   Yes Historical Provider, MD  simvastatin (ZOCOR) 40 MG tablet TAKE 1 TABLET BY MOUTH DAILY AT 6 PM. Patient taking differently: TAKE 1 TABLET BY MOUTH DAILY AT BEDTIME 07/13/15  Yes Mosie Lukes, MD  citalopram (CELEXA) 10 MG tablet Take 1 tablet (10 mg total) by mouth daily. Patient not taking: Reported on 12/22/2015 12/06/15   Mosie Lukes, MD    Allergies  Allergen Reactions  . Niacin And Related Rash    Facial redness and Rash head to toe  . Omnipaque [Iohexol] Rash    Facial redness and flushing    Review of Systems Weakness confusion  Positive for shortness of breath at times. Physical Exam Weak frail elderly gentleman resting in bed somewhat confused H2-C9 Systolic murmur Lungs clear in the anterior lung fields No edema Awake alert answers a few questions appropriately  Vital Signs: BP 93/69 mmHg  Pulse 61  Temp(Src) 97.4 F (36.3 C) (Oral)  Resp 20  Ht  _0  (1.905 m)  Wt 85.322 kg (188 lb 1.6 oz)  BMI 23.51 kg/m2  SpO2 97%  SpO2: SpO2: 97 % O2 Device:SpO2: 97 % O2 Flow Rate: .   IO: Intake/output summary:  Intake/Output Summary (Last 24 hours) at 12/23/15 1722 Last data filed at 12/23/15 1530  Gross per 24 hour  Intake    600 ml  Output   1075 ml  Net   -475 ml    LBM: Last BM Date: 12/23/15 Baseline Weight: Weight: 81.647 kg (180 lb) Most recent weight: Weight: 85.322 kg (188 lb 1.6 oz)      Palliative Assessment/Data:  Flowsheet Rows        Most Recent Value   Intake Tab    Referral Department  Hospitalist   Unit at Time of Referral  Med/Surg Unit   Palliative Care Primary Diagnosis  Cardiac   Palliative Care Type  New Palliative care   Reason for referral  Clarify Goals of Care   Date first seen by Palliative Care  12/23/15   Clinical Assessment    Palliative Performance Scale Score  30%   Pain Max last 24 hours  5   Pain Min Last 24 hours  4   Dyspnea Max Last 24 Hours  5   Dyspnea Min Last 24 hours  4   Psychosocial & Spiritual Assessment    Palliative Care Outcomes    Patient/Family meeting held?  Yes   Who was at the meeting?  wife son 2 daughters    Palliative Care follow-up planned  Yes, Facility      Additional Data Reviewed:  CBC:    Component Value Date/Time   WBC 3.5* 12/22/2015 2220   HGB 12.4* 12/22/2015 2220   HCT 37.7* 12/22/2015 2220   PLT 154  12/22/2015 2220   MCV 92.9 12/22/2015 2220   NEUTROABS 4.9 10/17/2015 1641   LYMPHSABS 1.4 10/17/2015 1641   MONOABS 0.5 10/17/2015 1641   EOSABS 0.1 10/17/2015 1641   BASOSABS 0.0 10/17/2015 1641   Comprehensive Metabolic Panel:    Component Value Date/Time   NA 136 12/23/2015 0529   K 3.9 12/23/2015 0529   CL 101 12/23/2015 0529   CO2 25 12/23/2015 0529   BUN 18 12/23/2015 0529   CREATININE 0.99 12/23/2015 0529   CREATININE 0.95 02/28/2014 1331   GLUCOSE 86 12/23/2015 0529   CALCIUM 8.6* 12/23/2015 0529   AST 23 10/17/2015 1641   ALT 18 10/17/2015 1641   ALKPHOS 67 10/17/2015 1641   BILITOT 1.0 10/17/2015 1641   PROT 6.3 10/17/2015 1641   ALBUMIN 3.8 10/17/2015 1641     Time In: 1515 Time Out: 1615 Time Total: 60 min  Greater than 50%  of this time was spent counseling and coordinating care related to the above assessment and plan.  Signed by: Loistine Chance, MD [place], MD  12/23/2015, 5:22 PM  Please contact Palliative Medicine Team phone at [phone] for questions and concerns.

## 2015-12-23 NOTE — Progress Notes (Addendum)
[facility]                                                                              Patient Demographics  Shawn Cortez, is a 82 y.o. male, DOB - 1924-11-14, [id number]  Admit date - 12/22/2015   Admitting Physician No admitting provider for patient encounter.  Outpatient Primary MD for the patient is Penni Homans, MD  LOS -    Chief Complaint  Patient presents with  . Weakness  . Shortness of Breath      HPI on 12/22/2015 by Dr. Flonnie Overman Dhungel 82 year old male with history of TIA, AAA, paroxysmal A. fib, not on anticoagulation (likely due to is and unsteady gait with risk for fall), mild dementia, deconditioning, CAD, chronic diastolic CHF, severe aortic stenosis who presented to the ED with increased urinary frequency and generalized weakness since 1 day. As per wife and son at bedside who provided most of the history, patient was getting increasingly weak for past few weeks. Last night he was unable to sleep properly getting restless and going to the bathroom to urinate frequently. This morning he appeared somewhat confused also. He also noted that his left eye was weaker than usual and had difficulty walking with some unsteadiness. Normally he uses a walker and is able to ambulate from his room to the kitchen and to the bathroom only. He also complained of feeling slightly dizzy. Denies any headache, blurred vision, fever, chills, URI symptoms, chest pain, palpitations, shortness of breath, abdominal pain, dysuria, hematuria, diarrhea (had some 1 week ago). Complains of some pain in his bilateral thighs. Patient also has bilateral leg edema which the wife has increased over the past 1 month. Denies any orthopnea or PND. As per wife he has lost almost 30 pounds in the past 6 months. No sick contacts or recent travel. No change in medications recently.  Assessment & Plan   Acute on chronic diastolic CHF -chest x-ray showed vascular congestion, small bilateral  effusions -BNP 456.5 -cardiology consulted and appreciated -Monitor intake and output, daily weights -Continue Lasix   Severe aortic stenosis -Per family, surgery is not being pursued at this time -Palliative care has been consulted -Per cardiology, without intervention, life expectancy less than 1 year  Elevated troponin/ CAD -Likely secondary to the above -Patient had left heart catheterization October 2016 which showed 65% LAD lesion -Given possible comfort care measures, no aggressive workup at this time -Continue Plavix, statin  Generalized weakness -Suspect multifactorial including progression of underlying comorbidities -Suspected to be secondary to UTI however UA was negative for infection -Will consult PT for evaluation  Atrial fibrillation -CHADSVASC 3 (based on age and CHF) -Currently not on anticoagulation due to fall risk -Appears to be rate and rhythm controlled  Dementia -Per family has been waxing and waning  Increased urinary frequency -As mentioned above, UA was negative for infection -Likely secondary to Lasix versus questionable BPH -Will start Flomax  Goals of care -Palliative care consulted -Spoke with Dr. Rowe Pavy, family meeting for 3:30 PM today -Spoke with patient's family regarding possible hospice versus comfort care, TBD after palliative meeting   Code Status: DNR  Family Communication: Family at bedside  Disposition Plan:  Admitted  Time Spent in minutes   30 minutes  Procedures  None  Consults   Cardiology  Palliative care  DVT Prophylaxis  Lovenox  Lab Results  Component Value Date   PLT 154 12/22/2015    Medications  Scheduled Meds: . clopidogrel  75 mg Oral Daily  . enoxaparin (LOVENOX) injection  40 mg Subcutaneous Q24H  . feeding supplement   Oral Q1200  . furosemide  20 mg Intravenous Q12H  . hydroxypropyl methylcellulose / hypromellose  1 drop Both Eyes BID  . omega-3 acid ethyl esters  2 g Oral QHS  .  simvastatin  40 mg Oral QHS  . sodium chloride flush  3 mL Intravenous Q12H   Continuous Infusions:  PRN Meds:.acetaminophen **OR** acetaminophen, bisacodyl, ibuprofen, LORazepam, ondansetron **OR** ondansetron (ZOFRAN) IV, polyethylene glycol, simethicone, zolpidem  Antibiotics    Anti-infectives    None      Subjective:   Shawn Cortez seen and examined today.  Patient had a very restless night. Does not understand that he has a foley catheter, feels he need to go to the bathroom.   Objective:   Filed Vitals:   12/22/15 1915 12/22/15 2106 12/22/15 2346 12/23/15 0401  BP: 114/71 111/93 119/88 98/77  Pulse: 79 87 81 74  Temp:  97.7 F (36.5 C) 97.8 F (36.6 C) 98.4 F (36.9 C)  TempSrc:  Oral Oral Oral  Resp: 13 16 18 18   Height:  6\' 3"  (1.905 m)    Weight:  82.146 kg (181 lb 1.6 oz)  85.322 kg (188 lb 1.6 oz)  SpO2: 97% 97% 98% 93%    Wt Readings from Last 3 Encounters:  12/23/15 85.322 kg (188 lb 1.6 oz)  12/22/15 86.212 kg (190 lb 1 oz)  12/06/15 86.637 kg (191 lb)     Intake/Output Summary (Last 24 hours) at 12/23/15 1255 Last data filed at 12/22/15 1815  Gross per 24 hour  Intake      0 ml  Output    425 ml  Net   -425 ml    Exam  General: Well developed, well nourished, NAD, appears stated age  13: NCAT, mucous membranes moist.   Cardiovascular: S1 S2 auscultated, 3/6 SEM, Regular rate and rhythm.  Respiratory: Clear to auscultation bilaterally anteriorly   Abdomen: Soft, nontender, nondistended, + bowel sounds  Extremities: warm dry without cyanosis clubbing. +LE edema B/L  Neuro: AAOx2, nonfocal  Data Review   Micro Results No results found for this or any previous visit (from the past 240 hour(s)).  Radiology Reports Dg Chest 2 View  12/22/2015  CLINICAL DATA:  Bilateral leg weakness.  Shortness of breath. EXAM: CHEST  2 VIEW COMPARISON:  Chest CT 08/13/2015 FINDINGS: There are small bilateral pleural effusions with bibasilar  opacities, likely atelectasis. Cardiomegaly with vascular congestion. No overt edema. No acute bony abnormality. IMPRESSION: Cardiomegaly with vascular congestion, small bilateral effusions and bibasilar atelectasis. Electronically Signed   By: Rolm Baptise M.D.   On: 12/22/2015 15:46    CBC  Recent Labs Lab 12/22/15 1450 12/22/15 2220  WBC 4.1 3.5*  HGB 12.7* 12.4*  HCT 38.3* 37.7*  PLT 159 154  MCV 92.1 92.9  MCH 30.5 30.5  MCHC 33.2 32.9  RDW 14.7 14.7    Chemistries   Recent Labs Lab 12/22/15 1450 12/22/15 2220 12/23/15 0529  NA 135  --  136  K 4.2  --  3.9  CL 100*  --  101  CO2 23  --  25  GLUCOSE 98  --  86  BUN 21*  --  18  CREATININE 0.84 0.83 0.99  CALCIUM 8.9  --  8.6*   ------------------------------------------------------------------------------------------------------------------ estimated creatinine clearance is 61.6 mL/min (by C-G formula based on Cr of 0.99). ------------------------------------------------------------------------------------------------------------------ No results for input(s): HGBA1C in the last 72 hours. ------------------------------------------------------------------------------------------------------------------ No results for input(s): CHOL, HDL, LDLCALC, TRIG, CHOLHDL, LDLDIRECT in the last 72 hours. ------------------------------------------------------------------------------------------------------------------ No results for input(s): TSH, T4TOTAL, T3FREE, THYROIDAB in the last 72 hours.  Invalid input(s): FREET3 ------------------------------------------------------------------------------------------------------------------ No results for input(s): VITAMINB12, FOLATE, FERRITIN, TIBC, IRON, RETICCTPCT in the last 72 hours.  Coagulation profile No results for input(s): INR, PROTIME in the last 168 hours.  No results for input(s): DDIMER in the last 72 hours.  Cardiac Enzymes  Recent Labs Lab 12/22/15 1450   TROPONINI 0.10*   ------------------------------------------------------------------------------------------------------------------ Invalid input(s): POCBNP    Windel Keziah D.O. on 12/23/2015 at 12:55 PM  Between 7am to 7pm - Pager - [phone]  After 7pm go to [email or web address] - password [email or web address]  And look for the night coverage person covering for me after hours  [facility] Office  [phone]

## 2015-12-24 DIAGNOSIS — M79641 Pain in right hand: Secondary | ICD-10-CM

## 2015-12-24 DIAGNOSIS — M79642 Pain in left hand: Secondary | ICD-10-CM

## 2015-12-24 LAB — BASIC METABOLIC PANEL
ANION GAP: 12 (ref 5–15)
BUN: 19 mg/dL (ref 6–20)
CALCIUM: 8.5 mg/dL — AB (ref 8.9–10.3)
CO2: 26 mmol/L (ref 22–32)
CREATININE: 1.18 mg/dL (ref 0.61–1.24)
Chloride: 95 mmol/L — ABNORMAL LOW (ref 101–111)
GFR, EST NON AFRICAN AMERICAN: 53 mL/min — AB (ref >60)
Glucose, Bld: 100 mg/dL — ABNORMAL HIGH (ref 65–99)
Potassium: 3.4 mmol/L — ABNORMAL LOW (ref 3.5–5.1)
SODIUM: 133 mmol/L — AB (ref 135–145)

## 2015-12-24 LAB — URINE CULTURE

## 2015-12-24 LAB — CBC
HCT: 37.7 % — ABNORMAL LOW (ref 39.0–52.0)
Hemoglobin: 12.4 g/dL — ABNORMAL LOW (ref 13.0–17.0)
MCH: 29.9 pg (ref 26.0–34.0)
MCHC: 32.9 g/dL (ref 30.0–36.0)
MCV: 90.8 fL (ref 78.0–100.0)
PLATELETS: 145 10*3/uL — AB (ref 150–400)
RBC: 4.15 MIL/uL — ABNORMAL LOW (ref 4.22–5.81)
RDW: 14.4 % (ref 11.5–15.5)
WBC: 4.7 10*3/uL (ref 4.0–10.5)

## 2015-12-24 MED ORDER — MORPHINE SULFATE (PF) 2 MG/ML IV SOLN
2.0000 mg | Freq: Once | INTRAVENOUS | Status: AC
  Administered 2015-12-24: 2 mg via INTRAVENOUS
  Filled 2015-12-24: qty 1

## 2015-12-24 MED ORDER — MORPHINE SULFATE (CONCENTRATE) 10 MG/0.5ML PO SOLN
5.0000 mg | Freq: Four times a day (QID) | ORAL | Status: DC
  Administered 2015-12-24: 5 mg via ORAL
  Filled 2015-12-24: qty 0.5

## 2015-12-24 MED ORDER — OLANZAPINE 2.5 MG PO TABS
2.5000 mg | ORAL_TABLET | Freq: Every day | ORAL | Status: DC
  Filled 2015-12-24: qty 1

## 2015-12-24 MED ORDER — LORAZEPAM 2 MG/ML IJ SOLN
1.0000 mg | Freq: Every day | INTRAMUSCULAR | Status: DC
  Administered 2015-12-24: 1 mg via INTRAVENOUS
  Filled 2015-12-24: qty 1

## 2015-12-24 MED ORDER — BOOST / RESOURCE BREEZE PO LIQD
1.0000 | ORAL | Status: DC
  Administered 2015-12-25: 1 via ORAL

## 2015-12-24 MED ORDER — MORPHINE SULFATE (PF) 2 MG/ML IV SOLN: 2.0000 mg | INTRAVENOUS | Status: DC | PRN

## 2015-12-24 MED ORDER — MORPHINE SULFATE (PF) 2 MG/ML IV SOLN
2.0000 mg | Freq: Every day | INTRAVENOUS | Status: DC
  Administered 2015-12-24: 2 mg via INTRAVENOUS
  Filled 2015-12-24: qty 1

## 2015-12-24 MED ORDER — FUROSEMIDE 10 MG/ML IJ SOLN
20.0000 mg | Freq: Every day | INTRAMUSCULAR | Status: DC
  Administered 2015-12-25: 20 mg via INTRAVENOUS
  Filled 2015-12-24: qty 2

## 2015-12-24 MED ORDER — POTASSIUM CHLORIDE CRYS ER 20 MEQ PO TBCR
40.0000 meq | EXTENDED_RELEASE_TABLET | Freq: Every day | ORAL | Status: DC
  Administered 2015-12-24 – 2015-12-25 (×2): 40 meq via ORAL
  Filled 2015-12-24 (×2): qty 2

## 2015-12-24 MED ORDER — LORAZEPAM 1 MG PO TABS: 1.0000 mg | ORAL_TABLET | ORAL | Status: DC | PRN

## 2015-12-24 MED ORDER — LORAZEPAM 2 MG/ML IJ SOLN
1.0000 mg | Freq: Once | INTRAMUSCULAR | Status: AC
  Administered 2015-12-24: 1 mg via INTRAVENOUS
  Filled 2015-12-24: qty 1

## 2015-12-24 MED ORDER — ENSURE ENLIVE PO LIQD: 237.0000 mL | ORAL | Status: DC

## 2015-12-24 NOTE — Progress Notes (Signed)
Initial Nutrition Assessment  DOCUMENTATION CODES:   Non-severe (moderate) malnutrition in context of chronic illness  INTERVENTION:  Provide Boost Breeze po once daily, each supplement provides 250 kcal and 9 grams of protein Provide Ensure Enlive po once daily, each supplement provides 350 kcal and 20 grams of protein Provide Magic cup BID with meals, each supplement provides 290 kcal and 9 grams of protein  Provided and discussed "Suggestions for Increasing Calories and Protein" handout from the Academy of [employer] and Dietetics with patient's family  NUTRITION DIAGNOSIS:   Inadequate oral intake related to poor appetite as evidenced by per patient/family report, moderate depletions of muscle mass.   GOAL:   Patient will meet greater than or equal to 90% of their needs   MONITOR:   PO intake, Supplement acceptance, Labs, Weight trends, Skin  REASON FOR ASSESSMENT:   Consult Assessment of nutrition requirement/status  ASSESSMENT:   82 year old male with history of TIA, AAA, paroxysmal A. fib, not on anticoagulation (likely due to is and unsteady gait with risk for fall), mild dementia, deconditioning, CAD, chronic diastolic CHF, severe aortic stenosis who presented to the ED with increased urinary frequency and generalized weakness since 1 day. As per wife and son at bedside who provided most of the history, patient was getting increasingly weak for past few weeks.   Pt asleep at time of visit; per family at bedside pt has not been lucid when awake. Per family, pt is eating better here than PTA. Family reports that patient started eating poorly about 8 months ago after having dental surgery. Weight history shows pt has lost from 202 lbs to 181 lbs in approximately the past 6 months- 10% weight loss. Pt has obvious moderate muscle wasting of clavicles and temples and mild to moderate wasting of arms and orbital region.  He has been sipping on one Boost Breeze supplement  throughout the day. He drinks Boost/Ensure once daily at home and loves ice cream. Per nursing notes, he is eating 100% of meals. Lunch tray was about 50% complete at time of RD visit. Family states that patient has difficulty chewing and needs soft, chopped foods.   Labs: low sodium, low calcium  Diet Order:  Diet Heart Room service appropriate?: Yes; Fluid consistency:: Thin  Skin:  Reviewed, no issues  Last BM:  2/26  Height:   Ht Readings from Last 1 Encounters:  12/22/15 6\' 3"  (1.905 m)    Weight:   Wt Readings from Last 1 Encounters:  12/24/15 181 lb 3.2 oz (82.192 kg)    Ideal Body Weight:  89.1 kg  BMI:  Body mass index is 22.65 kg/(m^2).  Estimated Nutritional Needs:   Kcal:  2000-2200  Protein:  105-120 grams  Fluid:  2-2.2 L/day  EDUCATION NEEDS:   No education needs identified at this time  [place], LDN Inpatient Clinical Dietitian Pager: [phone] After Hours Pager: [phone]

## 2015-12-24 NOTE — Progress Notes (Signed)
[facility]                                                                              Patient Demographics  Shawn Cortez, is a 82 y.o. male, DOB - 04/15/1925, [id number]  Admit date - 12/22/2015   Admitting Physician No admitting provider for patient encounter.  Outpatient Primary MD for the patient is Penni Homans, MD  LOS - 1   Chief Complaint  Patient presents with  . Weakness  . Shortness of Breath      HPI on 12/22/2015 by Dr. Flonnie Overman Dhungel 82 year old male with history of TIA, AAA, paroxysmal A. fib, not on anticoagulation (likely due to is and unsteady gait with risk for fall), mild dementia, deconditioning, CAD, chronic diastolic CHF, severe aortic stenosis who presented to the ED with increased urinary frequency and generalized weakness since 1 day. As per wife and son at bedside who provided most of the history, patient was getting increasingly weak for past few weeks. Last night he was unable to sleep properly getting restless and going to the bathroom to urinate frequently. This morning he appeared somewhat confused also. He also noted that his left eye was weaker than usual and had difficulty walking with some unsteadiness. Normally he uses a walker and is able to ambulate from his room to the kitchen and to the bathroom only. He also complained of feeling slightly dizzy. Denies any headache, blurred vision, fever, chills, URI symptoms, chest pain, palpitations, shortness of breath, abdominal pain, dysuria, hematuria, diarrhea (had some 1 week ago). Complains of some pain in his bilateral thighs. Patient also has bilateral leg edema which the wife has increased over the past 1 month. Denies any orthopnea or PND. As per wife he has lost almost 30 pounds in the past 6 months. No sick contacts or recent travel. No change in medications recently.  Assessment & Plan   Acute on chronic diastolic CHF -chest x-ray showed vascular congestion, small bilateral  effusions -BNP 456.5 -cardiology consulted and appreciated -Monitor intake and output, daily weights -Continue Lasix- will hold evening dose given soft BP -UOP over past 24hours 5L (net -3800)   Severe aortic stenosis -Per family, surgery is not being pursued at this time -Palliative care has been consulted- recommended SNF with hospice, added scheduled zyprexa and ativan (would be cautious with use of ativan given soft BP) -Per cardiology, without intervention, life expectancy less than 1 year  Elevated troponin/ CAD -Likely secondary to the above -Patient had left heart catheterization October 2016 which showed 65% LAD lesion -Given possible comfort care measures, no aggressive workup at this time -Continue Plavix, statin  Generalized weakness -Suspect multifactorial including progression of underlying comorbidities -Suspected to be secondary to UTI however UA was negative for infection -Will consult PT for evaluation  Atrial fibrillation -CHADSVASC 3 (based on age and CHF) -Currently not on anticoagulation due to fall risk -Appears to be rate and rhythm controlled  Dementia -Per family has been waxing and waning  Increased urinary frequency -As mentioned above, UA was negative for infection -Likely secondary to Lasix versus questionable BPH -Continue Flomax  Hypokalemia -Secondary to diuresis -Replace and continue to monitor BMP  Goals  of care -Palliative care consulted and appreciated -SNF with hospice -Added zyprexa and ativan scheduled  Code Status: DNR  Family Communication: Family at bedside  Disposition Plan: Admitted  Time Spent in minutes   30 minutes  Procedures  None  Consults   Cardiology  Palliative care  DVT Prophylaxis  Lovenox  Lab Results  Component Value Date   PLT 145* 12/24/2015    Medications  Scheduled Meds: . clopidogrel  75 mg Oral Daily  . enoxaparin (LOVENOX) injection  40 mg Subcutaneous Q24H  . feeding supplement    Oral Q1200  . furosemide  20 mg Intravenous Q12H  . hydroxypropyl methylcellulose / hypromellose  1 drop Both Eyes BID  . LORazepam  1 mg Intravenous Daily  .  morphine injection  2 mg Intravenous Daily  . morphine CONCENTRATE  5 mg Oral QID  . OLANZapine  2.5 mg Oral QHS  . omega-3 acid ethyl esters  2 g Oral QHS  . potassium chloride  40 mEq Oral Daily  . simvastatin  40 mg Oral QHS  . sodium chloride flush  3 mL Intravenous Q12H  . tamsulosin  0.4 mg Oral Daily   Continuous Infusions:  PRN Meds:.acetaminophen **OR** acetaminophen, bisacodyl, ibuprofen, LORazepam, morphine injection, morphine CONCENTRATE, ondansetron **OR** ondansetron (ZOFRAN) IV, polyethylene glycol, simethicone  Antibiotics    Anti-infectives    None      Subjective:   Shawn Cortez seen and examined today.  Patient complains of hand pain.  States heating pads usually help.  Denies chest pain, shortness of breath at this time.   Objective:   Filed Vitals:   12/23/15 2227 12/24/15 0608 12/24/15 1011 12/24/15 1145  BP: 101/59 104/73 93/57 82/52   Pulse: 71 74 73 79  Temp: 98 F (36.7 C) 97.4 F (36.3 C)  97.5 F (36.4 C)  TempSrc: Oral Oral  Oral  Resp: 19 18  18   Height:      Weight:  82.192 kg (181 lb 3.2 oz)    SpO2: 95% 93%  93%    Wt Readings from Last 3 Encounters:  12/24/15 82.192 kg (181 lb 3.2 oz)  12/22/15 86.212 kg (190 lb 1 oz)  12/06/15 86.637 kg (191 lb)     Intake/Output Summary (Last 24 hours) at 12/24/15 1310 Last data filed at 12/24/15 1019  Gross per 24 hour  Intake   1560 ml  Output   3650 ml  Net  -2090 ml    Exam  General: Well developed, well nourished, NAD, appears stated age  56: NCAT, mucous membranes moist.   Cardiovascular: S1 S2 auscultated, 3/6 SEM, Regular rate and rhythm.  Respiratory: Diminished but clear  Abdomen: Soft, nontender, nondistended, + bowel sounds  Extremities: warm dry without cyanosis clubbing. +LE edema B/L,  improving  Neuro: AAOx2, nonfocal  Psych: appropriate   Data Review   Micro Results Recent Results (from the past 240 hour(s))  Urine culture     Status: None   Collection Time: 12/22/15  6:02 PM  Result Value Ref Range Status   Specimen Description URINE, CLEAN CATCH  Final   Special Requests NONE  Final   Culture MULTIPLE SPECIES PRESENT, SUGGEST RECOLLECTION  Final   Report Status 12/24/2015 FINAL  Final    Radiology Reports Dg Chest 2 View  12/22/2015  CLINICAL DATA:  Bilateral leg weakness.  Shortness of breath. EXAM: CHEST  2 VIEW COMPARISON:  Chest CT 08/13/2015 FINDINGS: There are small bilateral pleural effusions with bibasilar opacities, likely  atelectasis. Cardiomegaly with vascular congestion. No overt edema. No acute bony abnormality. IMPRESSION: Cardiomegaly with vascular congestion, small bilateral effusions and bibasilar atelectasis. Electronically Signed   By: Rolm Baptise M.D.   On: 12/22/2015 15:46    CBC  Recent Labs Lab 12/22/15 1450 12/22/15 2220 12/24/15 0358  WBC 4.1 3.5* 4.7  HGB 12.7* 12.4* 12.4*  HCT 38.3* 37.7* 37.7*  PLT 159 154 145*  MCV 92.1 92.9 90.8  MCH 30.5 30.5 29.9  MCHC 33.2 32.9 32.9  RDW 14.7 14.7 14.4    Chemistries   Recent Labs Lab 12/22/15 1450 12/22/15 2220 12/23/15 0529 12/24/15 0358  NA 135  --  136 133*  K 4.2  --  3.9 3.4*  CL 100*  --  101 95*  CO2 23  --  25 26  GLUCOSE 98  --  86 100*  BUN 21*  --  18 19  CREATININE 0.84 0.83 0.99 1.18  CALCIUM 8.9  --  8.6* 8.5*   ------------------------------------------------------------------------------------------------------------------ estimated creatinine clearance is 50.3 mL/min (by C-G formula based on Cr of 1.18). ------------------------------------------------------------------------------------------------------------------ No results for input(s): HGBA1C in the last 72  hours. ------------------------------------------------------------------------------------------------------------------ No results for input(s): CHOL, HDL, LDLCALC, TRIG, CHOLHDL, LDLDIRECT in the last 72 hours. ------------------------------------------------------------------------------------------------------------------ No results for input(s): TSH, T4TOTAL, T3FREE, THYROIDAB in the last 72 hours.  Invalid input(s): FREET3 ------------------------------------------------------------------------------------------------------------------ No results for input(s): VITAMINB12, FOLATE, FERRITIN, TIBC, IRON, RETICCTPCT in the last 72 hours.  Coagulation profile No results for input(s): INR, PROTIME in the last 168 hours.  No results for input(s): DDIMER in the last 72 hours.  Cardiac Enzymes  Recent Labs Lab 12/22/15 1450  TROPONINI 0.10*   ------------------------------------------------------------------------------------------------------------------ Invalid input(s): POCBNP    Marykatherine Sherwood D.O. on 12/24/2015 at 1:10 PM  Between 7am to 7pm - Pager - [phone]  After 7pm go to [email or web address] - password [email or web address]  And look for the night coverage person covering for me after hours  [facility] Office  [phone]

## 2015-12-24 NOTE — NC FL2 (Signed)
[place] LEVEL OF CARE SCREENING TOOL     IDENTIFICATION  Patient Name: Shawn Cortez Birthdate: 28-Apr-1925 Sex: male Admission Date (Current Location): 12/22/2015  [facility] and [place] Number:  Herbalist and Address:  [facility] [place] [address], [place], [place] [address]      Provider Number: [id number]  Attending Physician Name and Address:  Cristal Ford, DO  Relative Name and Phone Number:       Current Level of Care: Hospital Recommended Level of Care: [place] Prior Approval Number:    Date Approved/Denied:   PASRR Number: [id number]  Discharge Plan: SNF    Current Diagnoses: Patient Active Problem List   Diagnosis Date Noted  . Pain in both hands   . Acute on chronic diastolic CHF (congestive heart failure), NYHA class 1 ([place]) 12/23/2015  . Dysuria   . Dyspnea   . Encounter for palliative care   . Weakness generalized 12/22/2015  . Acute on chronic diastolic (congestive) heart failure ([place]) 12/22/2015  . Elevated troponin   . Generalized weakness   . Lightheadedness   . Diarrhea 10/06/2015  . Frequent nosebleeds 10/06/2015  . Chronic diastolic congestive heart failure ([place])   . Coronary artery disease 08/01/2015  . Aortic stenosis, severe 07/02/2015  . New onset a-fib ([place]) 06/17/2015  . Rectal bleeding 06/17/2015  . SOB (shortness of breath) 06/17/2015  . Depression 06/17/2015  . Medicare annual wellness visit, subsequent 07/31/2014  . Preventative health care 06/26/2013  . Peripheral edema 03/29/2013  . Bilateral groin pain 02/13/2013  . Insomnia 12/26/2012  . Skin lesion of right arm 09/14/2012  . Cerumen impaction 09/14/2012  . Right leg weakness 03/01/2012  . Hyperglycemia 01/04/2012  . Tinnitus of right ear 05/08/2011  . COLONIC POLYPS 08/12/2010  . Hyperlipidemia 08/12/2010  . Hereditary and idiopathic peripheral neuropathy 08/12/2010  . DECREASED HEARING, RIGHT EAR  08/12/2010  . TIA 08/12/2010  . Abdominal aortic aneurysm ([place]) 08/12/2010  . SPINAL STENOSIS, LUMBAR 08/12/2010  . UNSTEADY GAIT 08/12/2010  . CARDIAC MURMUR 08/12/2010  . MEASLES, HX OF 08/12/2010  . CHICKENPOX, HX OF 08/12/2010    Orientation RESPIRATION BLADDER Height & Weight     Self, Time, Place  Normal Indwelling catheter Weight: 181 lb 3.2 oz (82.192 kg) (bed scale) Height:  6\' 3"  (190.5 cm)  BEHAVIORAL SYMPTOMS/MOOD NEUROLOGICAL BOWEL NUTRITION STATUS      Continent Diet  AMBULATORY STATUS COMMUNICATION OF NEEDS Skin   Supervision Verbally Normal                       Personal Care Assistance Level of Assistance  Bathing           Functional Limitations Info             SPECIAL CARE FACTORS FREQUENCY                       Contractures      Additional Factors Info  Code Status, Allergies Code Status Info: DNR Allergies Info: Niacin And Related, Omnipaque           Current Medications (12/24/2015):  This is the current hospital active medication list Current Facility-Administered Medications  Medication Dose Route Frequency Provider Last Rate Last Dose  . acetaminophen (TYLENOL) tablet 650 mg  650 mg Oral Q6H PRN Nishant Dhungel, MD   650 mg at 12/23/15 0236   Or  . acetaminophen (  TYLENOL) suppository 650 mg  650 mg Rectal Q6H PRN Nishant Dhungel, MD      . bisacodyl (DULCOLAX) EC tablet 5 mg  5 mg Oral QHS PRN Nishant Dhungel, MD      . clopidogrel (PLAVIX) tablet 75 mg  75 mg Oral Daily Nishant Dhungel, MD   75 mg at 12/24/15 1013  . enoxaparin (LOVENOX) injection 40 mg  40 mg Subcutaneous Q24H Nishant Dhungel, MD   40 mg at 12/23/15 2152  . feeding supplement (BOOST HIGH PROTEIN) liquid   Oral Q1200 Nishant Dhungel, MD      . Derrill Memo ON 12/25/2015] furosemide (LASIX) injection 20 mg  20 mg Intravenous Daily Maryann Mikhail, DO      . hydroxypropyl methylcellulose / hypromellose (ISOPTO TEARS / GONIOVISC) 2.5 % ophthalmic solution 1 drop   1 drop Both Eyes BID Romona Curls, RPH   1 drop at 12/24/15 1000  . ibuprofen (ADVIL,MOTRIN) tablet 200 mg  200 mg Oral Daily PRN Nishant Dhungel, MD   200 mg at 12/22/15 2350  . LORazepam (ATIVAN) injection 1 mg  1 mg Intravenous Daily Loistine Chance, MD      . LORazepam (ATIVAN) tablet 1 mg  1 mg Oral Q4H PRN Loistine Chance, MD      . morphine 2 MG/ML injection 2 mg  2 mg Intravenous Daily Loistine Chance, MD      . morphine 2 MG/ML injection 2 mg  2 mg Intravenous Q3H PRN Loistine Chance, MD      . morphine CONCENTRATE 10 MG/0.5ML oral solution 5 mg  5 mg Oral Q2H PRN Loistine Chance, MD      . morphine CONCENTRATE 10 MG/0.5ML oral solution 5 mg  5 mg Oral QID Loistine Chance, MD   5 mg at 12/24/15 1327  . OLANZapine (ZYPREXA) tablet 2.5 mg  2.5 mg Oral QHS Loistine Chance, MD      . omega-3 acid ethyl esters (LOVAZA) capsule 2 g  2 g Oral QHS Romona Curls, RPH   2 g at 12/23/15 2153  . ondansetron (ZOFRAN) tablet 4 mg  4 mg Oral Q6H PRN Nishant Dhungel, MD       Or  . ondansetron (ZOFRAN) injection 4 mg  4 mg Intravenous Q6H PRN Nishant Dhungel, MD      . polyethylene glycol (MIRALAX / GLYCOLAX) packet 17 g  17 g Oral QHS PRN Nishant Dhungel, MD      . potassium chloride SA (K-DUR,KLOR-CON) CR tablet 40 mEq  40 mEq Oral Daily Rhonda G Barrett, PA-C   40 mEq at 12/24/15 1148  . simethicone (MYLICON) chewable tablet 80 mg  80 mg Oral Q6H PRN Nishant Dhungel, MD      . simvastatin (ZOCOR) tablet 40 mg  40 mg Oral QHS Nishant Dhungel, MD   40 mg at 12/23/15 2154  . sodium chloride flush (NS) 0.9 % injection 3 mL  3 mL Intravenous Q12H Nishant Dhungel, MD   3 mL at 12/24/15 1012  . tamsulosin (FLOMAX) capsule 0.4 mg  0.4 mg Oral Daily Maryann Mikhail, DO   0.4 mg at 12/24/15 1013     Discharge Medications: Please see discharge summary for a list of discharge medications.  Relevant Imaging Results:  Relevant Lab Results:   Additional Information SS#: [id number]  Cranford Mon, [place]

## 2015-12-24 NOTE — Progress Notes (Addendum)
[facility] is able to accept patient into their skilled portion tomorrow (2/28) with hospice following- will need DC summary to state need for hospice to follow at SNF  CSW updated MD- anticipate pt will be stable for [place], [place] Worker [phone]

## 2015-12-24 NOTE — Progress Notes (Signed)
Patient blood pressure 82/52. Patient drowsy post morphine and ativan IV administration but no complaints. Dr. Ree Kida made aware. No new orders at this time. Will continue to monitor.

## 2015-12-24 NOTE — Clinical Social Work Note (Signed)
Clinical Social Work Assessment  Patient Details  Name: Shawn Cortez MRN: [id number] Date of Birth: 11-Jan-1925  Date of referral:  12/24/15               Reason for consult:  Facility Placement, End of Life/Hospice                Permission sought to share information with:  Family Supports Permission granted to share information::  Yes, Verbal Permission Granted  Name::     [place] Section::  [facility]  Relationship::  dtr  Contact Information:     Housing/Transportation Living arrangements for the past 2 months:  Charity fundraiser of Information:  Medical Team Patient Interpreter Needed:  None Criminal Activity/Legal Involvement Pertinent to Current Situation/Hospitalization:  No - Comment as needed Significant Relationships:  Adult Children, Spouse Lives with:  Spouse Do you feel safe going back to the place where you live?  No Need for family participation in patient care:  Yes (Comment) (decision making)  Care giving concerns: pt requiring high level of medical and physical management- no longer appropriate for independent living   Facilities manager / plan:  CSW informed of palliative meeting with family and family desire for pt to transition to [facility] with hospice following for comfort measures.    Employment status:  Retired Nurse, adult PT Recommendations:  [place] / Referral to community resources:  [place]  Patient/Family's Response to care:  Family is agreeable to plan but very sad about pt condition.  Patient/Family's Understanding of and Emotional Response to Diagnosis, Current Treatment, and Prognosis:  No questions or concerns at this time.  Emotional Assessment Appearance:  Appears stated age Attitude/Demeanor/Rapport:  Unable to Assess Affect (typically observed):  Unable to Assess Orientation:    Alcohol / Substance use:   Not Applicable Psych involvement (Current and /or in the community):  No (Comment)  Discharge Needs  Concerns to be addressed:  Care Coordination Readmission within the last 30 days:  No Current discharge risk:  Physical Impairment, Terminally ill Barriers to Discharge:  Continued Medical Work up   Cranford Mon, LCSW 12/24/2015, 2:33 PM

## 2015-12-24 NOTE — Progress Notes (Signed)
Daily Progress Note   Patient Name: Shawn Cortez       Date: 12/24/2015 DOB: 1925-10-07  Age: 82 y.o. MRN#: [id number] Attending Physician: Cristal Ford, DO Primary Care Physician: Penni Homans, MD Admit Date: 12/22/2015  Reason for Consultation/Follow-up: Establishing goals of care  82 yo male w/ hx D-CHF, PAF, TIA, dementia, deconditioning, severe AS, AAA. Admitted 02/25 w/ CHF, decision has been made to pursue symptom management and gentle treatments, hospice on d/c   Subjective:  patient did not rest well overnight, he has had severe pain in his hands bilaterally History of peripheral neuropathy, patient, wife and daughter very distraught   PLAN: Morphine and Ativan IV PRN and scheduled.  Discussed with patient and family  Change to Zyprexa  Interval Events:   Length of Stay: 1 day  Current Medications: Scheduled Meds:  . clopidogrel  75 mg Oral Daily  . enoxaparin (LOVENOX) injection  40 mg Subcutaneous Q24H  . feeding supplement   Oral Q1200  . furosemide  20 mg Intravenous Q12H  . hydroxypropyl methylcellulose / hypromellose  1 drop Both Eyes BID  . LORazepam  1 mg Intravenous Daily  .  morphine injection  2 mg Intravenous Daily  . morphine CONCENTRATE  5 mg Oral QID  . OLANZapine  2.5 mg Oral QHS  . omega-3 acid ethyl esters  2 g Oral QHS  . potassium chloride  40 mEq Oral Daily  . simvastatin  40 mg Oral QHS  . sodium chloride flush  3 mL Intravenous Q12H  . tamsulosin  0.4 mg Oral Daily    Continuous Infusions:    PRN Meds: acetaminophen **OR** acetaminophen, bisacodyl, ibuprofen, LORazepam, morphine injection, morphine CONCENTRATE, ondansetron **OR** ondansetron (ZOFRAN) IV, polyethylene glycol, simethicone  Physical Exam: Physical Exam              Mild to moderate distress due to pain S1 S2 Diminished Abdomen soft No edema Awake confused   Vital Signs: BP 82/52 mmHg  Pulse 79  Temp(Src) 97.5 F (36.4 C) (Oral)  Resp 18  Ht 6\' 3"  (1.905 m)  Wt 82.192 kg (181 lb 3.2 oz)  BMI 22.65 kg/m2  SpO2 93% SpO2: SpO2: 93 % O2 Device: O2 Device: Not Delivered O2 Flow Rate:    Intake/output summary:  Intake/Output Summary (Last 24 hours)  at 12/24/15 1251 Last data filed at 12/24/15 1019  Gross per 24 hour  Intake   1560 ml  Output   3650 ml  Net  -2090 ml   LBM: Last BM Date: 12/23/15 Baseline Weight: Weight: 81.647 kg (180 lb) Most recent weight: Weight: 82.192 kg (181 lb 3.2 oz) (bed scale)       Palliative Assessment/Data: Flowsheet Rows        Most Recent Value   Intake Tab    Referral Department  Hospitalist   Unit at Time of Referral  Med/Surg Unit   Palliative Care Primary Diagnosis  Cardiac   Date Notified  12/22/15   Palliative Care Type  New Palliative care   Reason for referral  Clarify Goals of Care   Date of Admission  12/22/15   Date first seen by Palliative Care  12/23/15   # of days Palliative referral response time  1 Day(s)   # of days IP prior to Palliative referral  0   Clinical Assessment    Palliative Performance Scale Score  30%   Pain Max last 24 hours  9   Pain Min Last 24 hours  8   Dyspnea Max Last 24 Hours  5   Dyspnea Min Last 24 hours  4   Psychosocial & Spiritual Assessment    Palliative Care Outcomes    Patient/Family meeting held?  Yes   Who was at the meeting?  wife daughter patient    Palliative Care follow-up planned  Yes, Facility      Additional Data Reviewed: CBC    Component Value Date/Time   WBC 4.7 12/24/2015 0358   RBC 4.15* 12/24/2015 0358   HGB 12.4* 12/24/2015 0358   HCT 37.7* 12/24/2015 0358   PLT 145* 12/24/2015 0358   MCV 90.8 12/24/2015 0358   MCH 29.9 12/24/2015 0358   MCHC 32.9 12/24/2015 0358   RDW 14.4 12/24/2015 0358   LYMPHSABS 1.4 10/17/2015  1641   MONOABS 0.5 10/17/2015 1641   EOSABS 0.1 10/17/2015 1641   BASOSABS 0.0 10/17/2015 1641    CMP     Component Value Date/Time   NA 133* 12/24/2015 0358   K 3.4* 12/24/2015 0358   CL 95* 12/24/2015 0358   CO2 26 12/24/2015 0358   GLUCOSE 100* 12/24/2015 0358   BUN 19 12/24/2015 0358   CREATININE 1.18 12/24/2015 0358   CREATININE 0.95 02/28/2014 1331   CALCIUM 8.5* 12/24/2015 0358   PROT 6.3 10/17/2015 1641   ALBUMIN 3.8 10/17/2015 1641   AST 23 10/17/2015 1641   ALT 18 10/17/2015 1641   ALKPHOS 67 10/17/2015 1641   BILITOT 1.0 10/17/2015 1641   GFRNONAA 53* 12/24/2015 0358   GFRAA >60 12/24/2015 0358       Problem List:  Patient Active Problem List   Diagnosis Date Noted  . Acute on chronic diastolic CHF (congestive heart failure), NYHA class 1 ([place]) 12/23/2015  . Dysuria   . Dyspnea   . Encounter for palliative care   . Weakness generalized 12/22/2015  . Acute on chronic diastolic (congestive) heart failure ([place]) 12/22/2015  . Elevated troponin   . Generalized weakness   . Lightheadedness   . Diarrhea 10/06/2015  . Frequent nosebleeds 10/06/2015  . Chronic diastolic congestive heart failure ([place])   . Coronary artery disease 08/01/2015  . Aortic stenosis, severe 07/02/2015  . New onset a-fib ([place]) 06/17/2015  . Rectal bleeding 06/17/2015  . SOB (shortness of breath) 06/17/2015  . Depression 06/17/2015  .  Medicare annual wellness visit, subsequent 07/31/2014  . Preventative health care 06/26/2013  . Peripheral edema 03/29/2013  . Bilateral groin pain 02/13/2013  . Insomnia 12/26/2012  . Skin lesion of right arm 09/14/2012  . Cerumen impaction 09/14/2012  . Right leg weakness 03/01/2012  . Hyperglycemia 01/04/2012  . Tinnitus of right ear 05/08/2011  . COLONIC POLYPS 08/12/2010  . Hyperlipidemia 08/12/2010  . Hereditary and idiopathic peripheral neuropathy 08/12/2010  . DECREASED HEARING, RIGHT EAR 08/12/2010  . TIA 08/12/2010  . Abdominal aortic  aneurysm ([place]) 08/12/2010  . SPINAL STENOSIS, LUMBAR 08/12/2010  . UNSTEADY GAIT 08/12/2010  . CARDIAC MURMUR 08/12/2010  . MEASLES, HX OF 08/12/2010  . CHICKENPOX, HX OF 08/12/2010     Palliative Care Assessment & Plan    1.Code Status:  DNR    Code Status Orders        Start     Ordered   12/22/15 2148  Do not attempt resuscitation (DNR)   Continuous    Question Answer Comment  In the event of cardiac or respiratory ARREST Do not call a "code blue"   In the event of cardiac or respiratory ARREST Do not perform Intubation, CPR, defibrillation or ACLS   In the event of cardiac or respiratory ARREST Use medication by any route, position, wound care, and other measures to relive pain and suffering. May use oxygen, suction and manual treatment of airway obstruction as needed for comfort.      12/22/15 2147    Code Status History    Date Active Date Inactive Code Status Order ID Comments User Context   12/22/2015  6:21 PM 12/22/2015  9:47 PM DNR [id number]  Louellen Molder, MD ED   08/01/2015 11:09 AM 08/01/2015  7:12 PM Full Code [id number]  Burnell Blanks, MD Inpatient    Advance Directive Documentation        Most Recent Value   Type of Advance Directive  Living will, Healthcare Power of Attorney   Pre-existing out of facility DNR order (yellow form or pink MOST form)     "MOST" Form in Place?         2. Goals of Care/Additional Recommendations:     Limitations on Scope of Treatment:   Desire for further Chaplaincy support:no  Psycho-social Needs: Caregiving  Support/Resources  3. Symptom Management:      1. As above   4. Palliative Prophylaxis:   Delirium Protocol  5. Prognosis: ?weeks  6. Discharge Planning:  [facility] with Hospice   Care plan was discussed with  Patient wife daughter Shelly Bombard   Thank you for allowing the Palliative Medicine Team to assist in the care of this patient.   Time In: 11 Time Out: 1135 Total Time 35  Prolonged Time Billed  no        [id number] Loistine Chance, MD  12/24/2015, 12:51 PM  Please contact Palliative Medicine Team phone at [phone] for questions and concerns.

## 2015-12-24 NOTE — Progress Notes (Signed)
Patient Name: Shawn Cortez Date of Encounter: 12/24/2015  Principal Problem:   Weakness generalized Active Problems:   UNSTEADY GAIT   Peripheral edema   Aortic stenosis, severe   Chronic diastolic congestive heart failure (HCC)   Coronary artery disease   Acute on chronic diastolic (congestive) heart failure (HCC)   Generalized weakness   Dysuria   Dyspnea   Encounter for palliative care   Acute on chronic diastolic CHF (congestive heart failure), NYHA class 1 [facility])   Primary Cardiologist: Dr Oval Linsey  Patient Profile: 82 yo male w/ hx D-CHF, PAF, TIA, dementia, deconditioning, severe AS, AAA. Admitted 02/25 w/ CHF, decision has been made to pursue Palliative Care. No further cardiac procedures. Maximize medications.  SUBJECTIVE: Had a hard night because of hand pain, ?neuropathy, better on morphine. Denies respiratory issues  OBJECTIVE Filed Vitals:   12/23/15 1346 12/23/15 2227 12/24/15 0608 12/24/15 1011  BP: 93/69 101/59 104/73 93/57  Pulse: 61 71 74 73  Temp: 97.4 F (36.3 C) 98 F (36.7 C) 97.4 F (36.3 C)   TempSrc: Oral Oral Oral   Resp: 20 19 18    Height:      Weight:   181 lb 3.2 oz (82.192 kg)   SpO2: 97% 95% 93%     Intake/Output Summary (Last 24 hours) at 12/24/15 1113 Last data filed at 12/24/15 1019  Gross per 24 hour  Intake   1560 ml  Output   5250 ml  Net  -3690 ml   Filed Weights   12/22/15 2106 12/23/15 0401 12/24/15 [id number]  Weight: 181 lb 1.6 oz (82.146 kg) 188 lb 1.6 oz (85.322 kg) 181 lb 3.2 oz (82.192 kg)    PHYSICAL EXAM General: Well developed, well nourished, male in no acute distress. Head: Normocephalic, atraumatic.  Neck: Supple without bruits, JVD 8-9 cm. Lungs:  Resp regular and unlabored, rales bases. Heart: Irreg R&R, S1, S2, no S3, S4, 3/6 murmur; no rub. Abdomen: Soft, non-tender, non-distended, BS + x 4.  Extremities: No clubbing, cyanosis, Pedal edema only. Reddened areas on both heels/ankles, RN aware    Neuro: Alert and oriented X 1. Moves all extremities spontaneously.  LABS: CBC: Recent Labs  12/22/15 2220 12/24/15 0358  WBC 3.5* 4.7  HGB 12.4* 12.4*  HCT 37.7* 37.7*  MCV 92.9 90.8  PLT 154 [id number]*   Basic Metabolic Panel: Recent Labs  12/23/15 0529 12/24/15 0358  NA 136 133*  K 3.9 3.4*  CL 101 95*  CO2 25 26  GLUCOSE 86 100*  BUN 18 19  CREATININE 0.99 1.18  CALCIUM 8.6* 8.5*   Cardiac Enzymes: Recent Labs  12/22/15 1450  TROPONINI 0.10*    Recent Labs  12/22/15 1504  TROPIPOC 0.09*   BNP:  B NATRIURETIC PEPTIDE  Date/Time Value Ref Range Status  12/22/2015 03:11 PM 456.5* 0.0 - 100.0 pg/mL Final   TELE: atrial fib, rate generally controlled, episode WCT  Radiology/Studies: Dg Chest 2 View  12/22/2015  CLINICAL DATA:  Bilateral leg weakness.  Shortness of breath. EXAM: CHEST  2 VIEW COMPARISON:  Chest CT 08/13/2015 FINDINGS: There are small bilateral pleural effusions with bibasilar opacities, likely atelectasis. Cardiomegaly with vascular congestion. No overt edema. No acute bony abnormality. IMPRESSION: Cardiomegaly with vascular congestion, small bilateral effusions and bibasilar atelectasis. Electronically Signed   By: Rolm Baptise M.D.   On: 12/22/2015 15:46     Current Medications:  . clopidogrel  75 mg Oral Daily  . enoxaparin (LOVENOX) injection  40 mg Subcutaneous Q24H  . feeding supplement   Oral Q1200  . furosemide  20 mg Intravenous Q12H  . hydroxypropyl methylcellulose / hypromellose  1 drop Both Eyes BID  . LORazepam  1 mg Intravenous Daily  .  morphine injection  2 mg Intravenous Daily  . morphine CONCENTRATE  5 mg Oral QID  . OLANZapine  2.5 mg Oral QHS  . omega-3 acid ethyl esters  2 g Oral QHS  . simvastatin  40 mg Oral QHS  . sodium chloride flush  3 mL Intravenous Q12H  . tamsulosin  0.4 mg Oral Daily      ASSESSMENT AND PLAN: #1. Severe aortic stenosis: Shawn Cortez is a candidate for TAVR but he and his family choose  palliation. This seems reasonable. We will attempt to optimize his volume status. - Continue Lasix 40 mg IV bid, hopefully change to PO in am. - Weight is down, LE edema much improved - supplement K+ (may decrease ventricular ectopy) - Palliative Care has seen him. Appreciate their assistance. Insomnia, hand pain, restlessness and feeling of need to urinate are his main complaints at this time. Without aortic valve replacement life expectancy is <1 year.  #2. CAD, elevated troponin: Cath 07/2015 w/ 65% LAD which could be the cause of the elevated troponin. However, without significant EKG changes or chest pain, this seems unlikely. They are not interested in cardiac cath. Continue plavix for now. However, could consider stopping this given his goals of care.  #3. Atrial fibrillation: Shawn Cortez is in atrial fib. Not on anticoagulation given that he is on plavix and is a fall risk. He has not been requiring any rate control as an outpatient.  #4. Hyperlipidemia: Given that we are pursuing a palliative approach, stopped statin.  #5. Increased urinary frequency: Urinalysis is negative for infection. His daughter reports that the nurse who inserted the foley in the ED thought his prostate was enlarged. He may benefit from finasteride. Will leave to primary MD.  Otherwise, per IM Principal Problem:   Weakness generalized Active Problems:   UNSTEADY GAIT   Peripheral edema   Aortic stenosis, severe   Chronic diastolic congestive heart failure (HCC)   Coronary artery disease   Acute on chronic diastolic (congestive) heart failure (HCC)   Generalized weakness   Dysuria   Dyspnea   Encounter for palliative care   Acute on chronic diastolic CHF (congestive heart failure), NYHA class 1 (HCC)   Signed, Shawn Cortez , PA-C 11:13 AM 12/24/2015

## 2015-12-25 ENCOUNTER — Telehealth: Payer: Self-pay | Admitting: *Deleted

## 2015-12-25 ENCOUNTER — Telehealth: Payer: Self-pay | Admitting: Family Medicine

## 2015-12-25 DIAGNOSIS — R0600 Dyspnea, unspecified: Secondary | ICD-10-CM

## 2015-12-25 DIAGNOSIS — E440 Moderate protein-calorie malnutrition: Secondary | ICD-10-CM | POA: Insufficient documentation

## 2015-12-25 LAB — CBC
HEMATOCRIT: 39.5 % (ref 39.0–52.0)
HEMOGLOBIN: 13.5 g/dL (ref 13.0–17.0)
MCH: 31.4 pg (ref 26.0–34.0)
MCHC: 34.2 g/dL (ref 30.0–36.0)
MCV: 91.9 fL (ref 78.0–100.0)
Platelets: 148 10*3/uL — ABNORMAL LOW (ref 150–400)
RBC: 4.3 MIL/uL (ref 4.22–5.81)
RDW: 14.6 % (ref 11.5–15.5)
WBC: 4.2 10*3/uL (ref 4.0–10.5)

## 2015-12-25 LAB — BASIC METABOLIC PANEL
ANION GAP: 9 (ref 5–15)
BUN: 17 mg/dL (ref 6–20)
CHLORIDE: 98 mmol/L — AB (ref 101–111)
CO2: 26 mmol/L (ref 22–32)
Calcium: 8.4 mg/dL — ABNORMAL LOW (ref 8.9–10.3)
Creatinine, Ser: 0.94 mg/dL (ref 0.61–1.24)
GFR calc non Af Amer: >60 mL/min (ref >60)
GLUCOSE: 103 mg/dL — AB (ref 65–99)
Potassium: 3.9 mmol/L (ref 3.5–5.1)
Sodium: 133 mmol/L — ABNORMAL LOW (ref 135–145)

## 2015-12-25 MED ORDER — TAMSULOSIN HCL 0.4 MG PO CAPS
0.4000 mg | ORAL_CAPSULE | Freq: Every day | ORAL | Status: DC
Start: 2015-12-25 — End: 2015-12-31

## 2015-12-25 MED ORDER — MORPHINE SULFATE (CONCENTRATE) 10 MG/0.5ML PO SOLN: 5.0000 mg | ORAL | Status: DC | PRN

## 2015-12-25 MED ORDER — FUROSEMIDE 40 MG PO TABS: 20.0000 mg | ORAL_TABLET | Freq: Every day | ORAL | Status: AC

## 2015-12-25 MED ORDER — MORPHINE SULFATE (CONCENTRATE) 10 MG/0.5ML PO SOLN
5.0000 mg | Freq: Two times a day (BID) | ORAL | Status: DC
Start: 2015-12-25 — End: 2015-12-31

## 2015-12-25 MED ORDER — ENSURE ENLIVE PO LIQD: 237.0000 mL | ORAL | Status: DC

## 2015-12-25 MED ORDER — LORAZEPAM 1 MG PO TABS
1.0000 mg | ORAL_TABLET | ORAL | Status: DC | PRN
Start: 2015-12-25 — End: 2016-01-17

## 2015-12-25 MED ORDER — BOOST / RESOURCE BREEZE PO LIQD: 1.0000 | ORAL | Status: DC

## 2015-12-25 MED ORDER — LORAZEPAM 0.5 MG PO TABS: 0.5000 mg | ORAL_TABLET | Freq: Two times a day (BID) | ORAL | Status: DC

## 2015-12-25 NOTE — Discharge Instructions (Signed)
Aortic Stenosis Aortic stenosis is a narrowing of the aortic valve. The aortic valve is a gate-like structure that is located between the lower left chamber of the heart (left ventricle) and the blood vessel that leads away from the heart (aorta). When the aortic valve is narrowed, it does not open all the way. This makes it hard for the heart to pump blood into the aorta and causes the heart to work harder. The extra work can weaken the heart over time and lead to heart failure. CAUSES  Causes of aortic stenosis include:  Calcium deposits on the aortic valve that have made the valve stiff. This condition generally affects those over the age of 68. It is the most common cause of aortic stenosis.  A birth defect.  Rheumatic fever. This is a problem that may occur after a strep throat infection that was not treated adequately. Rheumatic fever can cause permanent damage to heart valves. SIGNS AND SYMPTOMS  People with aortic stenosis usually have no symptoms until the condition becomes severe. It may take 10-20 years for mild or moderate aortic stenosis to become severe. Symptoms may include:   Shortness of breath, especially with physical activity.   Feeling weak and tired (fatigued) or getting tired easily.  Chest discomfort (angina). This may occur with minimal activity if the aortic stenosis is severe.  An irregular or faster-than-normal heartbeat.  Dizziness or fainting that happens with exertion or after taking certain heart medicines (such as nitroglycerin). DIAGNOSIS  Aortic stenosis is usually diagnosed with a physical exam and with a type of imaging test called echocardiography. During echocardiography, sound waves are used to evaluate how blood flows through the heart. If your health care provider suspects aortic stenosis but the test does not clearly show it, a procedure called cardiac catheterization may be done to diagnose the condition. Tests may also be done to evaluate heart  function. They may include:  Electrocardiography. During this test, the electrical impulses of the heart are recorded while you are lying down and sticky patches are placed on your chest, arms, and legs.  Stress tests. There is more than one type of stress test. If a stress test is needed, ask your health care provider about which type is best for you.  Blood tests. TREATMENT  Treatment depends on how severe the aortic stenosis is, your symptoms, and the problems it is causing.   Observation. If the aortic stenosis is mild, no treatment may be needed. However, you will need to have the condition checked regularly to make sure it is not getting worse or causing serious problems.  Surgery. Surgery to repair or replace the aortic valve is the most common treatment for aortic stenosis. Several types of surgeries are available. The most common are open-heart surgery and transcutaneous aortic valve replacement (TAVR). TAVR does not require that the chest be opened. It is usually performed on elderly patients and those who are not able to have open-heart surgery.  Medicines. Medicines may be given to keep symptoms from getting worse. Medicines cannot reverse aortic stenosis. HOME CARE INSTRUCTIONS   You may need to avoid certain types of physical activity. If your aortic stenosis is mild, you may need to avoid only strenuous activity. The more severe your aortic stenosis, the more activities you will need to avoid. Talk with your health care provider about the types of activity you should avoid.  Take medicines only as directed by your health care provider.  If you are a woman with  aortic valve stenosis and want to get pregnant, talk to your health care provider before you become pregnant.  If you are a woman with aortic valve stenosis and are pregnant, keep all follow-up visits with all recommended health care providers.  Keep all follow-up visits for tests, exams, and treatments as directed by  your health care provider. SEEK IMMEDIATE MEDICAL CARE IF:  You develop chest pain or tightness.   You develop shortness of breath or difficulty breathing.   You develop light-headedness or faint.   It feels like your heartbeat is irregular or faster than normal.  You have a fever.   This information is not intended to replace advice given to you by your health care provider. Make sure you discuss any questions you have with your health care provider.   Document Released: 07/12/2003 Document Revised: 07/04/2015 Document Reviewed: 10/07/2012 Elsevier Interactive Patient Education 2016 [employer] Heart Failure Heart failure is a condition in which the heart has trouble pumping blood. This means your heart does not pump blood efficiently for your body to work well. In some cases of heart failure, fluid may back up into your lungs or you may have swelling (edema) in your lower legs. Heart failure is usually a long-term (chronic) condition. It is important for you to take good care of yourself and follow your health care provider's treatment plan. CAUSES  Some health conditions can cause heart failure. Those health conditions include:  High blood pressure (hypertension). Hypertension causes the heart muscle to work harder than normal. When pressure in the blood vessels is high, the heart needs to pump (contract) with more force in order to circulate blood throughout the body. High blood pressure eventually causes the heart to become stiff and weak.  Coronary artery disease (CAD). CAD is the buildup of cholesterol and fat (plaque) in the arteries of the heart. The blockage in the arteries deprives the heart muscle of oxygen and blood. This can cause chest pain and may lead to a heart attack. High blood pressure can also contribute to CAD.  Heart attack (myocardial infarction). A heart attack occurs when one or more arteries in the heart become blocked. The loss of oxygen damages the muscle  tissue of the heart. When this happens, part of the heart muscle dies. The injured tissue does not contract as well and weakens the heart's ability to pump blood.  Abnormal heart valves. When the heart valves do not open and close properly, it can cause heart failure. This makes the heart muscle pump harder to keep the blood flowing.  Heart muscle disease (cardiomyopathy or myocarditis). Heart muscle disease is damage to the heart muscle from a variety of causes. These can include drug or alcohol abuse, infections, or unknown reasons. These can increase the risk of heart failure.  Lung disease. Lung disease makes the heart work harder because the lungs do not work properly. This can cause a strain on the heart, leading it to fail.  Diabetes. Diabetes increases the risk of heart failure. High blood sugar contributes to high fat (lipid) levels in the blood. Diabetes can also cause slow damage to tiny blood vessels that carry important nutrients to the heart muscle. When the heart does not get enough oxygen and food, it can cause the heart to become weak and stiff. This leads to a heart that does not contract efficiently.  Other conditions can contribute to heart failure. These include abnormal heart rhythms, thyroid problems, and low blood counts (anemia). Certain unhealthy  behaviors can increase the risk of heart failure, including:  Being overweight.  Smoking or chewing tobacco.  Eating foods high in fat and cholesterol.  Abusing illicit drugs or alcohol.  Lacking physical activity. SYMPTOMS  Heart failure symptoms may vary and can be hard to detect. Symptoms may include:  Shortness of breath with activity, such as climbing stairs.  Persistent cough.  Swelling of the feet, ankles, legs, or abdomen.  Unexplained weight gain.  Difficulty breathing when lying flat (orthopnea).  Waking from sleep because of the need to sit up and get more air.  Rapid heartbeat.  Fatigue and loss of  energy.  Feeling light-headed, dizzy, or close to fainting.  Loss of appetite.  Nausea.  Increased urination during the night (nocturia). DIAGNOSIS  A diagnosis of heart failure is based on your history, symptoms, physical examination, and diagnostic tests. Diagnostic tests for heart failure may include:  Echocardiography.  Electrocardiography.  Chest X-ray.  Blood tests.  Exercise stress test.  Cardiac angiography.  Radionuclide scans. TREATMENT  Treatment is aimed at managing the symptoms of heart failure. Medicines, behavioral changes, or surgical intervention may be necessary to treat heart failure.  Medicines to help treat heart failure may include:  Angiotensin-converting enzyme (ACE) inhibitors. This type of medicine blocks the effects of a blood protein called angiotensin-converting enzyme. ACE inhibitors relax (dilate) the blood vessels and help lower blood pressure.  Angiotensin receptor blockers (ARBs). This type of medicine blocks the actions of a blood protein called angiotensin. Angiotensin receptor blockers dilate the blood vessels and help lower blood pressure.  Water pills (diuretics). Diuretics cause the kidneys to remove salt and water from the blood. The extra fluid is removed through urination. This loss of extra fluid lowers the volume of blood the heart pumps.  Beta blockers. These prevent the heart from beating too fast and improve heart muscle strength.  Digitalis. This increases the force of the heartbeat.  Healthy behavior changes include:  Obtaining and maintaining a healthy weight.  Stopping smoking or chewing tobacco.  Eating heart-healthy foods.  Limiting or avoiding alcohol.  Stopping illicit drug use.  Physical activity as directed by your health care provider.  Surgical treatment for heart failure may include:  A procedure to open blocked arteries, repair damaged heart valves, or remove damaged heart muscle tissue.  A  pacemaker to improve heart muscle function and control certain abnormal heart rhythms.  An internal cardioverter defibrillator to treat certain serious abnormal heart rhythms.  A left ventricular assist device (LVAD) to assist the pumping ability of the heart. HOME CARE INSTRUCTIONS   Take medicines only as directed by your health care provider. Medicines are important in reducing the workload of your heart, slowing the progression of heart failure, and improving your symptoms.  Do not stop taking your medicine unless directed by your health care provider.  Do not skip any dose of medicine.  Refill your prescriptions before you run out of medicine. Your medicines are needed every day.  Engage in moderate physical activity if directed by your health care provider. Moderate physical activity can benefit some people. The elderly and people with severe heart failure should consult with a health care provider for physical activity recommendations.  Eat heart-healthy foods. Food choices should be free of trans fat and low in saturated fat, cholesterol, and salt (sodium). Healthy choices include fresh or frozen fruits and vegetables, fish, lean meats, legumes, fat-free or low-fat dairy products, and whole grain or high fiber foods. Talk to  a dietitian to learn more about heart-healthy foods.  Limit sodium if directed by your health care provider. Sodium restriction may reduce symptoms of heart failure in some people. Talk to a dietitian to learn more about heart-healthy seasonings.  Use healthy cooking methods. Healthy cooking methods include roasting, grilling, broiling, baking, poaching, steaming, or stir-frying. Talk to a dietitian to learn more about healthy cooking methods.  Limit fluids if directed by your health care provider. Fluid restriction may reduce symptoms of heart failure in some people.  Weigh yourself every day. Daily weights are important in the early recognition of excess fluid.  You should weigh yourself every morning after you urinate and before you eat breakfast. Wear the same amount of clothing each time you weigh yourself. Record your daily weight. Provide your health care provider with your weight record.  Monitor and record your blood pressure if directed by your health care provider.  Check your pulse if directed by your health care provider.  Lose weight if directed by your health care provider. Weight loss may reduce symptoms of heart failure in some people.  Stop smoking or chewing tobacco. Nicotine makes your heart work harder by causing your blood vessels to constrict. Do not use nicotine gum or patches before talking to your health care provider.  Keep all follow-up visits as directed by your health care provider. This is important.  Limit alcohol intake to no more than 1 drink per day for nonpregnant women and 2 drinks per day for men. One drink equals 12 ounces of beer, 5 ounces of wine, or 1 ounces of hard liquor. Drinking more than that is harmful to your heart. Tell your health care provider if you drink alcohol several times a week. Talk with your health care provider about whether alcohol is safe for you. If your heart has already been damaged by alcohol or you have severe heart failure, drinking alcohol should be stopped completely.  Stop illicit drug use.  Stay up-to-date with immunizations. It is especially important to prevent respiratory infections through current pneumococcal and influenza immunizations.  Manage other health conditions such as hypertension, diabetes, thyroid disease, or abnormal heart rhythms as directed by your health care provider.  Learn to manage stress.  Plan rest periods when fatigued.  Learn strategies to manage high temperatures. If the weather is extremely hot:  Avoid vigorous physical activity.  Use air conditioning or fans or seek a cooler location.  Avoid caffeine and alcohol.  Wear loose-fitting,  lightweight, and light-colored clothing.  Learn strategies to manage cold temperatures. If the weather is extremely cold:  Avoid vigorous physical activity.  Layer clothes.  Wear mittens or gloves, a hat, and a scarf when going outside.  Avoid alcohol.  Obtain ongoing education and support as needed.  Participate in or seek rehabilitation as needed to maintain or improve independence and quality of life. SEEK MEDICAL CARE IF:   You have a rapid weight gain.  You have increasing shortness of breath that is unusual for you.  You are unable to participate in your usual physical activities.  You tire easily.  You cough more than normal, especially with physical activity.  You have any or more swelling in areas such as your hands, feet, ankles, or abdomen.  You are unable to sleep because it is hard to breathe.  You feel like your heart is beating fast (palpitations).  You become dizzy or light-headed upon standing up. SEEK IMMEDIATE MEDICAL CARE IF:   You have difficulty  breathing.  There is a change in mental status such as decreased alertness or difficulty with concentration.  You have a pain or discomfort in your chest.  You have an episode of fainting (syncope). MAKE SURE YOU:   Understand these instructions.  Will watch your condition.  Will get help right away if you are not doing well or get worse.   This information is not intended to replace advice given to you by your health care provider. Make sure you discuss any questions you have with your health care provider.   Document Released: 10/13/2005 Document Revised: 02/27/2015 Document Reviewed: 11/12/2012 Elsevier Interactive Patient Education [employer].

## 2015-12-25 NOTE — Progress Notes (Signed)
Called report to SNF at [facility]. Patient being d/c with foley per family request.

## 2015-12-25 NOTE — Telephone Encounter (Signed)
Received OT Plan of Care; forwarded to provider [out of office until Thurs, 12/27/15]/SLS 02/28

## 2015-12-25 NOTE — Progress Notes (Signed)
Pt for d/c to [facility] (SNF) today.CSW spoke with pt's dtr who reports she is meeting with Joellen Jersey in admissions at this time and is agreeable to d/c. DC summary and signed fl2 sent to facility via hub. [facility] arranged for transport. CSW signing off at d/c.   Wandra Feinstein, MSW, LCSW

## 2015-12-25 NOTE — Telephone Encounter (Signed)
Daughter called to inform Dr. Charlett Blake pt in hospital and will be released to [facility] under hospice & palliative care

## 2015-12-25 NOTE — Discharge Summary (Addendum)
Physician Discharge Summary  Shawn Cortez [id number] DOB: [birth year] DOA: 12/22/2015  PCP: Penni Homans, MD  Admit date: 12/22/2015 Discharge date: 12/25/2015  Time spent: 45 minutes  Recommendations for Outpatient Follow-up:  Patient will be discharged to skill nursing facility with hospice to follow.  Patient will need to follow up with primary care provider within one week of discharge, repeat BMP.  Patient should continue medications as prescribed.  Patient should follow a heart healthy/carb modified diet.   Discharge Diagnoses:  Acute on chronic diastolic heart failure Severe aortic stenosis Elevated troponin/CAD Generalized weakness Atrial fibrillation Dementia Increased urinary frequency Hypokalemia Goals of care  Discharge Condition: Stable  Diet recommendation: Heart healthy  Filed Weights   12/23/15 0401 12/24/15 0608 12/25/15 0534  Weight: 85.322 kg (188 lb 1.6 oz) 82.192 kg (181 lb 3.2 oz) 81.33 kg (179 lb 4.8 oz)    History of present illness:  on 12/22/2015 by Dr. Flonnie Overman Dhungel 82 year old male with history of TIA, AAA, paroxysmal A. fib, not on anticoagulation (likely due to is and unsteady gait with risk for fall), mild dementia, deconditioning, CAD, chronic diastolic CHF, severe aortic stenosis who presented to the ED with increased urinary frequency and generalized weakness since 1 day. As per wife and son at bedside who provided most of the history, patient was getting increasingly weak for past few weeks. Last night he was unable to sleep properly getting restless and going to the bathroom to urinate frequently. This morning he appeared somewhat confused also. He also noted that his left eye was weaker than usual and had difficulty walking with some unsteadiness. Normally he uses a walker and is able to ambulate from his room to the kitchen and to the bathroom only. He also complained of feeling slightly dizzy. Denies any headache, blurred vision,  fever, chills, URI symptoms, chest pain, palpitations, shortness of breath, abdominal pain, dysuria, hematuria, diarrhea (had some 1 week ago). Complains of some pain in his bilateral thighs. Patient also has bilateral leg edema which the wife has increased over the past 1 month. Denies any orthopnea or PND. As per wife he has lost almost 30 pounds in the past 6 months. No sick contacts or recent travel. No change in medications recently.  Hospital Course:  Acute on chronic diastolic CHF -chest x-ray showed vascular congestion, small bilateral effusions -BNP 456.5 -cardiology consulted and appreciated -Monitor intake and output, daily weights -Continue Lasix, will discharge with low dose lasix 20mg  daily -UOP over past 24hours 5L (net -3800)  Severe aortic stenosis -Per family, surgery is not being pursued at this time -Palliative care has been consulted- recommended SNF with hospice, added scheduled zyprexa and ativan (would be cautious with use of ativan given soft BP) -Per cardiology, without intervention, life expectancy less than 1 year  Elevated troponin/ CAD -Likely secondary to the above -Patient had left heart catheterization October 2016 which showed 65% LAD lesion -Given possible comfort care measures, no aggressive workup at this time -Continue Plavix, statin  Generalized weakness -Suspect multifactorial including progression of underlying comorbidities -Suspected to be secondary to UTI however UA was negative for infection -PT recommended SNF  Atrial fibrillation -CHADSVASC 3 (based on age and CHF) -Currently not on anticoagulation due to fall risk -Appears to be rate and rhythm controlled  Dementia -Per family has been waxing and waning  Increased urinary frequency -As mentioned above, UA was negative for infection -Likely secondary to Lasix versus questionable BPH -Continue Flomax  Hypokalemia -Secondary to diuresis -Replace  and continue to monitor  BMP  Goals of care -Palliative care consulted and appreciated -SNF with hospice -Added zyprexa and ativan, morphine scheduled and PRN  Malnutrition, moderate -Continue feeding supplements  Code Status: DNR  Procedures  None  Consults  Cardiology  Palliative care  Discharge Exam: Filed Vitals:   12/24/15 2042 12/25/15 0534  BP: 85/51 92/63  Pulse: 54 62  Temp: 97.6 F (36.4 C) 97.4 F (36.3 C)  Resp: 18 14    Exam  General: Well developed, well nourished, NAD, appears stated age  HEENT: NCAT, mucous membranes moist.   Cardiovascular: S1 S2 auscultated, 3/6 SEM, Regular rate and rhythm.  Respiratory: Diminished but clear  Abdomen: Soft, nontender, nondistended, + bowel sounds  Extremities: warm dry without cyanosis clubbing. + trace LE edema B/L  Neuro: AAOx2, nonfocal  Psych: appropriate and pleasant  Discharge Instructions      Discharge Instructions    Discharge instructions    Complete by:  As directed   Patient will be discharged to skill nursing facility with hospice to follow.  Patient will need to follow up with primary care provider within one week of discharge, repeat BMP.  Patient should continue medications as prescribed.  Patient should follow a heart healthy/carb modified diet.            Medication List    STOP taking these medications        citalopram 10 MG tablet  Commonly known as:  CELEXA      TAKE these medications        acetaminophen 500 MG tablet  Commonly known as:  TYLENOL  Take 500 mg by mouth every 8 (eight) hours as needed for mild pain or moderate pain.     bisacodyl 5 MG EC tablet  Commonly known as:  DULCOLAX  Take 5 mg by mouth at bedtime as needed for mild constipation.     clopidogrel 75 MG tablet  Commonly known as:  PLAVIX  TAKE 1 TABLET BY MOUTH ATBEDTIME.     fish oil-omega-3 fatty acids 1000 MG capsule  Take 2 g by mouth at bedtime.     furosemide 40 MG tablet  Commonly known as:  LASIX   Take 0.5 tablets (20 mg total) by mouth daily.     GAS-X PO  Take 1 tablet by mouth as needed.     ibuprofen 200 MG tablet  Commonly known as:  ADVIL,MOTRIN  Take 200 mg by mouth daily as needed (pain).     LORazepam 1 MG tablet  Commonly known as:  ATIVAN  Take 1 tablet (1 mg total) by mouth every 4 (four) hours as needed for anxiety or sleep.     LORazepam 0.5 MG tablet  Commonly known as:  ATIVAN  Take 1 tablet (0.5 mg total) by mouth 2 (two) times daily.     morphine CONCENTRATE 10 MG/0.5ML Soln concentrated solution  Take 0.25 mLs (5 mg total) by mouth 2 (two) times daily.     morphine CONCENTRATE 10 MG/0.5ML Soln concentrated solution  Take 0.25 mLs (5 mg total) by mouth every 2 (two) hours as needed for severe pain or shortness of breath.     NUTRITIONAL SHAKE PLUS PROTEIN PO  Take 1 Can by mouth daily at 12 noon.     feeding supplement Liqd  Take 1 Container by mouth daily.     feeding supplement (ENSURE ENLIVE) Liqd  Take 237 mLs by mouth daily.     polyethylene glycol  packet  Commonly known as:  MIRALAX / GLYCOLAX  Take 17 g by mouth at bedtime as needed for mild constipation.     REFRESH OP  Place 1 drop into both eyes daily as needed (itching/watery eyes).     simvastatin 40 MG tablet  Commonly known as:  ZOCOR  TAKE 1 TABLET BY MOUTH DAILY AT 6 PM.     tamsulosin 0.4 MG Caps capsule  Commonly known as:  FLOMAX  Take 1 capsule (0.4 mg total) by mouth daily.       Allergies  Allergen Reactions  . Niacin And Related Rash    Facial redness and Rash head to toe  . Omnipaque [Iohexol] Rash    Facial redness and flushing   Follow-up Information    Follow up with Penni Homans, MD. Schedule an appointment as soon as possible for a visit in 1 week.   Specialty:  Family Medicine   Why:  Hospital follow up   Contact information:   [address] [place] [address] [phone]        The results of significant diagnostics from  this hospitalization (including imaging, microbiology, ancillary and laboratory) are listed below for reference.    Significant Diagnostic Studies: Dg Chest 2 View  12/22/2015  CLINICAL DATA:  Bilateral leg weakness.  Shortness of breath. EXAM: CHEST  2 VIEW COMPARISON:  Chest CT 08/13/2015 FINDINGS: There are small bilateral pleural effusions with bibasilar opacities, likely atelectasis. Cardiomegaly with vascular congestion. No overt edema. No acute bony abnormality. IMPRESSION: Cardiomegaly with vascular congestion, small bilateral effusions and bibasilar atelectasis. Electronically Signed   By: Rolm Baptise M.D.   On: 12/22/2015 15:46    Microbiology: Recent Results (from the past 240 hour(s))  Urine culture     Status: None   Collection Time: 12/22/15  6:02 PM  Result Value Ref Range Status   Specimen Description URINE, CLEAN CATCH  Final   Special Requests NONE  Final   Culture MULTIPLE SPECIES PRESENT, SUGGEST RECOLLECTION  Final   Report Status 12/24/2015 FINAL  Final     Labs: Basic Metabolic Panel:  Recent Labs Lab 12/22/15 1450 12/22/15 2220 12/23/15 0529 12/24/15 0358 12/25/15 0650  NA 135  --  136 133* 133*  K 4.2  --  3.9 3.4* 3.9  CL 100*  --  101 95* 98*  CO2 23  --  25 26 26   GLUCOSE 98  --  86 100* 103*  BUN 21*  --  18 19 17   CREATININE 0.84 0.83 0.99 1.18 0.94  CALCIUM 8.9  --  8.6* 8.5* 8.4*   Liver Function Tests: No results for input(s): AST, ALT, ALKPHOS, BILITOT, PROT, ALBUMIN in the last 168 hours. No results for input(s): LIPASE, AMYLASE in the last 168 hours. No results for input(s): AMMONIA in the last 168 hours. CBC:  Recent Labs Lab 12/22/15 1450 12/22/15 2220 12/24/15 0358 12/25/15 0650  WBC 4.1 3.5* 4.7 4.2  HGB 12.7* 12.4* 12.4* 13.5  HCT 38.3* 37.7* 37.7* 39.5  MCV 92.1 92.9 90.8 91.9  PLT 159 154 145* 148*   Cardiac Enzymes:  Recent Labs Lab 12/22/15 1450  TROPONINI 0.10*   BNP: BNP (last 3 results)  Recent Labs   12/22/15 1511  BNP 456.5*    ProBNP (last 3 results) No results for input(s): PROBNP in the last 8760 hours.  CBG: No results for input(s): GLUCAP in the last 168 hours.     Signed:  Cristal Ford  [facility] 12/25/2015, 11:06 AM

## 2015-12-26 ENCOUNTER — Encounter: Payer: Self-pay | Admitting: Nurse Practitioner

## 2015-12-26 ENCOUNTER — Non-Acute Institutional Stay (SKILLED_NURSING_FACILITY): Payer: Medicare HMO | Admitting: Nurse Practitioner

## 2015-12-26 DIAGNOSIS — G609 Hereditary and idiopathic neuropathy, unspecified: Secondary | ICD-10-CM

## 2015-12-26 DIAGNOSIS — I5032 Chronic diastolic (congestive) heart failure: Secondary | ICD-10-CM

## 2015-12-26 DIAGNOSIS — F329 Major depressive disorder, single episode, unspecified: Secondary | ICD-10-CM

## 2015-12-26 DIAGNOSIS — F32A Depression, unspecified: Secondary | ICD-10-CM

## 2015-12-26 NOTE — Assessment & Plan Note (Signed)
-  chest x-ray showed vascular congestion, small bilateral effusions -BNP 456.5 -cardiology consulted  -Continue Lasix, will discharge with low dose lasix 20mg  daily

## 2015-12-26 NOTE — Assessment & Plan Note (Signed)
Most pain is in BLE/hands, continue Morphine for comfort measures.

## 2015-12-26 NOTE — Progress Notes (Signed)
Patient ID: Shawn Cortez, male   DOB: Jun 22, 1925, 82 y.o.   MRN: [id number]  Location:  [facility] Room Number: 17 Place of Service:  SNF (31) Provider: Marlana Latus NP  Penni Homans, MD  Patient Care Team: Mosie Lukes, MD as PCP - General Rexene Alberts, MD as Consulting Physician (Cardiothoracic Surgery) Skeet Latch, MD as Attending Physician (Cardiology)  Extended Emergency Contact Information Primary Emergency Contact: Docia Barrier, [place] [address] Johnnette Litter of [place] Phone: [phone] Relation: Daughter Secondary Emergency Contact: Vanderbeck,Carol Address: [address]          [place], [place] [address] Johnnette Litter of [place] Phone: [phone] Mobile Phone: [phone] Relation: Spouse  Code Status:  DNR Goals of care: Advanced Directive information Advanced Directives 12/22/2015  Does patient have an advance directive? Yes  Type of Advance Directive Living will;Healthcare Power of Attorney  Does patient want to make changes to advanced directive? No - Patient declined  Copy of advanced directive(s) in chart? No - copy requested     Chief Complaint  Patient presents with  . Medical Management of Chronic Issues  . Acute Visit    end of life care, Hospice referral.     HPI:  Pt is a 82 y.o. male seen today for a hospital f/u s/p admission from [facility] for end stage of CHF and end of life care. Hospitalized 12/22/15 to 12/25/15 for Acute on chronic diastolic CHF, Severe aortic stenosis non surgical candidate, Atrial fibrillation, Foley Cath in place  Past Medical History  Diagnosis Date  . Arthritis   . SOB (shortness of breath) on exertion   . Hyperlipidemia   . Stroke [facility])     TIA history 2005  . Hyperglycemia 01/04/2012  . Leg pain, right 03/01/2012  . Right leg weakness 03/01/2012  . Skin lesion of right arm 09/14/2012  . Abrasion of hand 09/14/2012  . Cerumen impaction 09/14/2012  .  Unsteady gait 12/26/2012  . Insomnia 12/26/2012  . Bilateral groin pain 02/13/2013    L>R  . Peripheral edema 03/29/2013  . Preventative health care 06/26/2013  . AAA (abdominal aortic aneurysm) ([place])   . Atrial fibrillation ([place])   . Aortic stenosis, severe 07/02/2015  . Depression 06/17/2015  . New onset a-fib ([place]) 06/17/2015  . Chronic diastolic congestive heart failure ([place])   . Hereditary and idiopathic peripheral neuropathy 08/12/2010  . SPINAL STENOSIS, LUMBAR 08/12/2010  . UNSTEADY GAIT 08/12/2010  . TIA 08/12/2010  . Coronary artery disease 08/01/2015    65% stenosis mid LAD   Past Surgical History  Procedure Laterality Date  . Thumb tendon severed and repaired      right, tendon snapped  . Carpal tunnel release      Right  . Achilles tendon repair      left, repaired w/ wire  . Brain surgery      done by Dr Lizabeth Leyden for tinnitius, unsuccessful, only on right  . Inguinal herniorrhapy  years ago    right and then 2 on left  . Lumbar laminectomy      L3-L5 posterior laminectomy 2006  . Joint replacement      Bilateral total knee replacement  . Spine surgery    . Cardiac catheterization N/A 08/01/2015    Procedure: Right/Left Heart Cath and Coronary Angiography;  Surgeon: Burnell Blanks, MD;  Location: [facility];  Service: Cardiovascular;  Laterality: N/A;  Allergies  Allergen Reactions  . Niacin And Related Rash    Facial redness and Rash head to toe  . Omnipaque [Iohexol] Rash    Facial redness and flushing      Medication List       This list is accurate as of: 12/26/15  5:11 PM.  Always use your most recent med list.               acetaminophen 500 MG tablet  Commonly known as:  TYLENOL  Take 500 mg by mouth every 8 (eight) hours as needed for mild pain or moderate pain.     bisacodyl 5 MG EC tablet  Commonly known as:  DULCOLAX  Take 5 mg by mouth at bedtime as needed for mild constipation.     clopidogrel 75 MG tablet  Commonly known  as:  PLAVIX  TAKE 1 TABLET BY MOUTH ATBEDTIME.     fish oil-omega-3 fatty acids 1000 MG capsule  Take 2 g by mouth at bedtime.     furosemide 40 MG tablet  Commonly known as:  LASIX  Take 0.5 tablets (20 mg total) by mouth daily.     GAS-X PO  Take 1 tablet by mouth as needed.     ibuprofen 200 MG tablet  Commonly known as:  ADVIL,MOTRIN  Take 200 mg by mouth daily as needed (pain).     LORazepam 1 MG tablet  Commonly known as:  ATIVAN  Take 1 tablet (1 mg total) by mouth every 4 (four) hours as needed for anxiety or sleep.     LORazepam 0.5 MG tablet  Commonly known as:  ATIVAN  Take 1 tablet (0.5 mg total) by mouth 2 (two) times daily.     morphine CONCENTRATE 10 MG/0.5ML Soln concentrated solution  Take 0.25 mLs (5 mg total) by mouth 2 (two) times daily.     morphine CONCENTRATE 10 MG/0.5ML Soln concentrated solution  Take 0.25 mLs (5 mg total) by mouth every 2 (two) hours as needed for severe pain or shortness of breath.     NUTRITIONAL SHAKE PLUS PROTEIN PO  Take 1 Can by mouth daily at 12 noon.     feeding supplement Liqd  Take 1 Container by mouth daily.     feeding supplement (ENSURE ENLIVE) Liqd  Take 237 mLs by mouth daily.     polyethylene glycol packet  Commonly known as:  MIRALAX / GLYCOLAX  Take 17 g by mouth at bedtime as needed for mild constipation.     REFRESH OP  Place 1 drop into both eyes daily as needed (itching/watery eyes).     simvastatin 40 MG tablet  Commonly known as:  ZOCOR  TAKE 1 TABLET BY MOUTH DAILY AT 6 PM.     tamsulosin 0.4 MG Caps capsule  Commonly known as:  FLOMAX  Take 1 capsule (0.4 mg total) by mouth daily.        Review of Systems  Constitutional: Positive for activity change, appetite change, fatigue and unexpected weight change. Negative for fever, chills and diaphoresis.  Respiratory: Negative for cough, chest tightness and shortness of breath.   Cardiovascular: Negative for chest pain, palpitations and leg  swelling.  Gastrointestinal: Negative for nausea, vomiting and abdominal pain.  Musculoskeletal: Negative for neck pain and neck stiffness.  Neurological: Positive for weakness. Negative for dizziness, tremors, seizures, syncope, facial asymmetry, speech difficulty, light-headedness, numbness and headaches.       Pain in BUE/hands  Psychiatric/Behavioral: Negative for behavioral  problems, confusion and agitation. The patient is not nervous/anxious.     Immunization History  Administered Date(s) Administered  . Influenza Split 08/06/2012  . Influenza Whole 08/12/2010, 08/21/2011  . Influenza,inj,Quad PF,36+ Mos 07/31/2014, 08/07/2015, 10/06/2015  . PPD Test 08/09/2015  . Pneumococcal Conjugate-13 10/02/2015  . Pneumococcal Polysaccharide-23 06/27/2009  . Tdap 08/07/2015   Pertinent  Health Maintenance Due  Topic Date Due  . INFLUENZA VACCINE  05/27/2016  . PNA vac Low Risk Adult  Completed   Fall Risk  10/02/2015 07/31/2014  Falls in the past year? No No   Functional Status Survey:    Filed Vitals:   12/26/15 1710  BP: 160/70  Pulse: 82  Temp: 99.2 F (37.3 C)  TempSrc: Tympanic  Resp: 18   There is no weight on file to calculate BMI. Physical Exam  Constitutional: He is oriented to person, place, and time. He appears well-developed and well-nourished. No distress.  HENT:  Head: Normocephalic and atraumatic.  Nose: Nose normal.  Eyes: Right eye exhibits no discharge. Left eye exhibits no discharge.  Neck: Normal range of motion. Neck supple.  Cardiovascular: Normal rate.   Murmur heard. Irregularly irregular  Pulmonary/Chest: Effort normal and breath sounds normal.  Abdominal: Soft. Bowel sounds are normal. There is no tenderness.  Musculoskeletal: He exhibits no edema.  Neurological: He is alert and oriented to person, place, and time.  Skin: Skin is warm and dry.  Psychiatric: He has a normal mood and affect.  Nursing note and vitals reviewed.   Labs  reviewed:  Recent Labs  12/23/15 0529 12/24/15 0358 12/25/15 0650  NA 136 133* 133*  K 3.9 3.4* 3.9  CL 101 95* 98*  CO2 25 26 26   GLUCOSE 86 100* 103*  BUN 18 19 17   CREATININE 0.99 1.18 0.94  CALCIUM 8.6* 8.5* 8.4*    Recent Labs  01/29/15 1609 06/04/15 1259 10/17/15 1641  AST 20 22 23   ALT 14 18 18   ALKPHOS 60 67 67  BILITOT 0.9 1.1 1.0  PROT 7.0 7.2 6.3  ALBUMIN 4.0 4.0 3.8    Recent Labs  10/17/15 1641  12/22/15 2220 12/24/15 0358 12/25/15 0650  WBC 6.8  < > 3.5* 4.7 4.2  NEUTROABS 4.9  --   --   --   --   HGB 13.4  < > 12.4* 12.4* 13.5  HCT 40.6  < > 37.7* 37.7* 39.5  MCV 93.6  < > 92.9 90.8 91.9  PLT 158.0  < > 154 145* 148*  < > = values in this interval not displayed. Lab Results  Component Value Date   TSH 1.90 06/04/2015   Lab Results  Component Value Date   HGBA1C 5.9 12/07/2015   Lab Results  Component Value Date   CHOL 115 01/29/2015   HDL 44.10 01/29/2015   LDLCALC 57 01/29/2015   TRIG 72.0 01/29/2015   CHOLHDL 3 01/29/2015    Significant Diagnostic Results in last 30 days:  Dg Chest 2 View  12/22/2015  CLINICAL DATA:  Bilateral leg weakness.  Shortness of breath. EXAM: CHEST  2 VIEW COMPARISON:  Chest CT 08/13/2015 FINDINGS: There are small bilateral pleural effusions with bibasilar opacities, likely atelectasis. Cardiomegaly with vascular congestion. No overt edema. No acute bony abnormality. IMPRESSION: Cardiomegaly with vascular congestion, small bilateral effusions and bibasilar atelectasis. Electronically Signed   By: Rolm Baptise M.D.   On: 12/22/2015 15:46    Assessment/Plan  Chronic diastolic congestive heart failure ([place]) -chest x-ray showed vascular  congestion, small bilateral effusions -BNP 456.5 -cardiology consulted  -Continue Lasix, will discharge with low dose lasix 20mg  daily   Hereditary and idiopathic peripheral neuropathy Most pain is in BLE/hands, continue Morphine for comfort measures.    Depression Lorazepam for mood/anxiety.     Family/ staff Communication: comfort measures, Hospice referral.   Labs/tests ordered:  none

## 2015-12-26 NOTE — Assessment & Plan Note (Addendum)
Lorazepam for mood/anxiety.

## 2015-12-31 ENCOUNTER — Non-Acute Institutional Stay (SKILLED_NURSING_FACILITY): Payer: Medicare Other | Admitting: Nurse Practitioner

## 2015-12-31 ENCOUNTER — Encounter: Payer: Self-pay | Admitting: Nurse Practitioner

## 2015-12-31 DIAGNOSIS — I5032 Chronic diastolic (congestive) heart failure: Secondary | ICD-10-CM

## 2015-12-31 DIAGNOSIS — R451 Restlessness and agitation: Secondary | ICD-10-CM | POA: Diagnosis not present

## 2015-12-31 DIAGNOSIS — G609 Hereditary and idiopathic neuropathy, unspecified: Secondary | ICD-10-CM

## 2015-12-31 DIAGNOSIS — F329 Major depressive disorder, single episode, unspecified: Secondary | ICD-10-CM

## 2015-12-31 DIAGNOSIS — G4700 Insomnia, unspecified: Secondary | ICD-10-CM

## 2015-12-31 DIAGNOSIS — F32A Depression, unspecified: Secondary | ICD-10-CM

## 2015-12-31 NOTE — Assessment & Plan Note (Signed)
Lorazepam for mood/anxiety is not adequate, the patient exhibited restlessness and agitation at night, Ambien is not helping, private sitter at night implemented, will add Haldol prn, observe the patient.

## 2015-12-31 NOTE — Progress Notes (Signed)
Patient ID: Shawn Cortez, male   DOB: 1925-03-22, 82 y.o.   MRN: [id number]  Location:  [facility] Room Number: 17 Place of Service:  SNF (31) Provider: Marlana Latus NP  Penni Homans, MD  Patient Care Team: Mosie Lukes, MD as PCP - General Rexene Alberts, MD as Consulting Physician (Cardiothoracic Surgery) Skeet Latch, MD as Attending Physician (Cardiology)  Extended Emergency Contact Information Primary Emergency Contact: Docia Barrier, [place] [address] Johnnette Litter of [place] Phone: [phone] Relation: Daughter Secondary Emergency Contact: Mincer,Carol Address: [address]          [place], [place] [address] Johnnette Litter of [place] Phone: [phone] Mobile Phone: [phone] Relation: Spouse  Code Status:  DNR Goals of care: Advanced Directive information Advanced Directives 12/31/2015  Does patient have an advance directive? Yes  Type of Paramedic of [place];Living will  Does patient want to make changes to advanced directive? No - Patient declined  Copy of advanced directive(s) in chart? Yes     Chief Complaint  Patient presents with  . Sleeping Problem    restless, agitatation at night     HPI:  Pt is a 82 y.o. male seen today for restlessness, agitation, emotional outburst at night, failed Lorazepam, Ambien 5mg  hs has little efficacy, Hx of end stage of CHF and end of life care, under hospice service, prn Morphine and Ativan available to him, continue Furosemide for CHF and comfort purpose.  Hospitalized 12/22/15 to 12/25/15 for Acute on chronic diastolic CHF, Severe aortic stenosis non surgical candidate, Atrial fibrillation, Foley Cath in place  Past Medical History  Diagnosis Date  . Arthritis   . SOB (shortness of breath) on exertion   . Hyperlipidemia   . Stroke [facility])     TIA history 2005  . Hyperglycemia 01/04/2012  . Leg pain, right 03/01/2012  . Right leg  weakness 03/01/2012  . Skin lesion of right arm 09/14/2012  . Abrasion of hand 09/14/2012  . Cerumen impaction 09/14/2012  . Unsteady gait 12/26/2012  . Insomnia 12/26/2012  . Bilateral groin pain 02/13/2013    L>R  . Peripheral edema 03/29/2013  . Preventative health care 06/26/2013  . AAA (abdominal aortic aneurysm) ([place])   . Atrial fibrillation ([place])   . Aortic stenosis, severe 07/02/2015  . Depression 06/17/2015  . New onset a-fib ([place]) 06/17/2015  . Chronic diastolic congestive heart failure ([place])   . Hereditary and idiopathic peripheral neuropathy 08/12/2010  . SPINAL STENOSIS, LUMBAR 08/12/2010  . UNSTEADY GAIT 08/12/2010  . TIA 08/12/2010  . Coronary artery disease 08/01/2015    65% stenosis mid LAD   Past Surgical History  Procedure Laterality Date  . Thumb tendon severed and repaired      right, tendon snapped  . Carpal tunnel release      Right  . Achilles tendon repair      left, repaired w/ wire  . Brain surgery      done by Dr Lizabeth Leyden for tinnitius, unsuccessful, only on right  . Inguinal herniorrhapy  years ago    right and then 2 on left  . Lumbar laminectomy      L3-L5 posterior laminectomy 2006  . Joint replacement      Bilateral total knee replacement  . Spine surgery    . Cardiac catheterization N/A 08/01/2015    Procedure: Right/Left Heart Cath and Coronary Angiography;  Surgeon: Burnell Blanks,  MD;  Location: [facility];  Service: Cardiovascular;  Laterality: N/A;    Allergies  Allergen Reactions  . Niacin And Related Rash    Facial redness and Rash head to toe  . Omnipaque [Iohexol] Rash    Facial redness and flushing      Medication List       This list is accurate as of: 12/31/15  3:40 PM.  Always use your most recent med list.               acetaminophen 500 MG tablet  Commonly known as:  TYLENOL  Take 500 mg by mouth every 8 (eight) hours as needed for mild pain or moderate pain.     atorvastatin 20 MG tablet  Commonly known  as:  LIPITOR  Take 20 mg by mouth daily.     bisacodyl 5 MG EC tablet  Commonly known as:  DULCOLAX  Take 5 mg by mouth at bedtime as needed for mild constipation. Reported on 12/31/2015     bisacodyl 10 MG suppository  Commonly known as:  DULCOLAX  Place 10 mg rectally as needed for moderate constipation (If no BM in 3 days).     furosemide 40 MG tablet  Commonly known as:  LASIX  Take 0.5 tablets (20 mg total) by mouth daily.     GAS-X PO  Take 1 tablet by mouth as needed.     ibuprofen 200 MG tablet  Commonly known as:  ADVIL,MOTRIN  Take 200 mg by mouth daily as needed (pain).     LORazepam 1 MG tablet  Commonly known as:  ATIVAN  Take 1 tablet (1 mg total) by mouth every 4 (four) hours as needed for anxiety or sleep.     NUTRITIONAL SHAKE PLUS PROTEIN PO  Take 1 Can by mouth daily at 12 noon.     feeding supplement Liqd  Take 1 Container by mouth daily.     polyethylene glycol packet  Commonly known as:  MIRALAX / GLYCOLAX  Take 17 g by mouth at bedtime as needed for mild constipation.     sennosides-docusate sodium 8.6-50 MG tablet  Commonly known as:  SENOKOT-S  Take 2 tablets by mouth at bedtime. Hold for loose stools     zolpidem 5 MG tablet  Commonly known as:  AMBIEN  Take 5 mg by mouth at bedtime.        Review of Systems  Constitutional: Positive for activity change, appetite change, fatigue and unexpected weight change. Negative for fever, chills and diaphoresis.  Respiratory: Negative for cough, chest tightness and shortness of breath.   Cardiovascular: Negative for chest pain, palpitations and leg swelling.  Gastrointestinal: Negative for nausea, vomiting and abdominal pain.  Musculoskeletal: Negative for neck pain and neck stiffness.  Neurological: Positive for weakness. Negative for dizziness, tremors, seizures, syncope, facial asymmetry, speech difficulty, light-headedness, numbness and headaches.       Pain in BUE/hands  Psychiatric/Behavioral:  Negative for behavioral problems, confusion and agitation. The patient is not nervous/anxious.     Immunization History  Administered Date(s) Administered  . Influenza Split 08/06/2012  . Influenza Whole 08/12/2010, 08/21/2011  . Influenza,inj,Quad PF,36+ Mos 07/31/2014, 08/07/2015, 10/06/2015  . PPD Test 08/09/2015, 12/25/2015  . Pneumococcal Conjugate-13 10/02/2015  . Pneumococcal Polysaccharide-23 06/27/2009  . Tdap 08/07/2015   Pertinent  Health Maintenance Due  Topic Date Due  . INFLUENZA VACCINE  05/27/2016  . PNA vac Low Risk Adult  Completed   Fall Risk  10/02/2015 07/31/2014  Falls in the past year? No No   Functional Status Survey:    Filed Vitals:   12/31/15 1159  BP: 100/66  Pulse: 60  Temp: 96.7 F (35.9 C)  TempSrc: Oral  Resp: 16  Height: 6\' 4"  (1.93 m)  Weight: 181 lb (82.101 kg)   Body mass index is 22.04 kg/(m^2). Physical Exam  Constitutional: He is oriented to person, place, and time. He appears well-developed and well-nourished. No distress.  HENT:  Head: Normocephalic and atraumatic.  Nose: Nose normal.  Eyes: Right eye exhibits no discharge. Left eye exhibits no discharge.  Neck: Normal range of motion. Neck supple.  Cardiovascular: Normal rate.   Murmur heard. Irregularly irregular  Pulmonary/Chest: Effort normal and breath sounds normal.  Abdominal: Soft. Bowel sounds are normal. There is no tenderness.  Musculoskeletal: He exhibits no edema.  Neurological: He is alert and oriented to person, place, and time.  Skin: Skin is warm and dry.  Psychiatric: He has a normal mood and affect.  Nursing note and vitals reviewed.   Labs reviewed:  Recent Labs  12/23/15 0529 12/24/15 0358 12/25/15 0650  NA 136 133* 133*  K 3.9 3.4* 3.9  CL 101 95* 98*  CO2 25 26 26   GLUCOSE 86 100* 103*  BUN 18 19 17   CREATININE 0.99 1.18 0.94  CALCIUM 8.6* 8.5* 8.4*    Recent Labs  01/29/15 1609 06/04/15 1259 10/17/15 1641  AST 20 22 23   ALT 14  18 18   ALKPHOS 60 67 67  BILITOT 0.9 1.1 1.0  PROT 7.0 7.2 6.3  ALBUMIN 4.0 4.0 3.8    Recent Labs  10/17/15 1641  12/22/15 2220 12/24/15 0358 12/25/15 0650  WBC 6.8  < > 3.5* 4.7 4.2  NEUTROABS 4.9  --   --   --   --   HGB 13.4  < > 12.4* 12.4* 13.5  HCT 40.6  < > 37.7* 37.7* 39.5  MCV 93.6  < > 92.9 90.8 91.9  PLT 158.0  < > 154 145* 148*  < > = values in this interval not displayed. Lab Results  Component Value Date   TSH 1.90 06/04/2015   Lab Results  Component Value Date   HGBA1C 5.9 12/07/2015   Lab Results  Component Value Date   CHOL 115 01/29/2015   HDL 44.10 01/29/2015   LDLCALC 57 01/29/2015   TRIG 72.0 01/29/2015   CHOLHDL 3 01/29/2015    Significant Diagnostic Results in last 30 days:  Dg Chest 2 View  12/22/2015  CLINICAL DATA:  Bilateral leg weakness.  Shortness of breath. EXAM: CHEST  2 VIEW COMPARISON:  Chest CT 08/13/2015 FINDINGS: There are small bilateral pleural effusions with bibasilar opacities, likely atelectasis. Cardiomegaly with vascular congestion. No overt edema. No acute bony abnormality. IMPRESSION: Cardiomegaly with vascular congestion, small bilateral effusions and bibasilar atelectasis. Electronically Signed   By: Rolm Baptise M.D.   On: 12/22/2015 15:46    Assessment/Plan  Hereditary and idiopathic peripheral neuropathy Most pain is in BLE/hands, continue Morphine for comfort measures.   Chronic diastolic congestive heart failure ([place]) -chest x-ray showed vascular congestion, small bilateral effusions -BNP 456.5 -cardiology consulted  -Continue Lasix 20mg  daily   Insomnia Increase Ambien to 10mg  hs, adding Haldol 5mg  q8h prn for agitation and restlessness.   Depression Lorazepam for mood/anxiety is not adequate, the patient exhibited restlessness and agitation at night, Ambien is not helping, private sitter at night implemented, will add Haldol prn, observe the  patient.   Agitation Will try prn Haldol, observe the  patient.     Family/ staff Communication: comfort measures, Hospice service.   Labs/tests ordered:  none

## 2015-12-31 NOTE — Assessment & Plan Note (Signed)
Will try prn Haldol, observe the patient.

## 2015-12-31 NOTE — Assessment & Plan Note (Signed)
Increase Ambien to 10mg  hs, adding Haldol 5mg  q8h prn for agitation and restlessness.

## 2015-12-31 NOTE — Assessment & Plan Note (Signed)
Most pain is in BLE/hands, continue Morphine for comfort measures.

## 2015-12-31 NOTE — Assessment & Plan Note (Signed)
-  chest x-ray showed vascular congestion, small bilateral effusions -BNP 456.5 -cardiology consulted  -Continue Lasix 20mg  daily

## 2016-01-01 ENCOUNTER — Ambulatory Visit: Payer: Medicare HMO | Admitting: Family Medicine

## 2016-01-01 ENCOUNTER — Telehealth: Payer: Self-pay | Admitting: Family Medicine

## 2016-01-01 NOTE — Telephone Encounter (Signed)
Please write him a letter  To Whom it may concern  Shawn Cortez is a patient in our office with a complex medical history. He has end stage aortic valve disease, debility and numerous other health concerns. As a result he has become immobile and he can no longer perform his own ADLs without significant assistance.   Then supply to family and see if this is what they need

## 2016-01-01 NOTE — Telephone Encounter (Signed)
Pt says that her father is in a skilled nursing care facility. She says that she is attempting to get him disability. She would like to know if PCP could help her with providing documentation to show that he isn't mobile and is unable to assist himself . She says right now his wait is 90 days but if documentation is provided his wait may be a little shorter.      CBSantiago Glad (daughter) - [phone]

## 2016-01-02 ENCOUNTER — Encounter: Payer: Self-pay | Admitting: Family Medicine

## 2016-01-02 NOTE — Telephone Encounter (Signed)
Printed the letter as well as multiple office visit notes. Patients daughter will pickup later today at the front desk.

## 2016-01-17 ENCOUNTER — Non-Acute Institutional Stay (SKILLED_NURSING_FACILITY): Payer: Medicare Other | Admitting: Internal Medicine

## 2016-01-17 ENCOUNTER — Encounter: Payer: Self-pay | Admitting: Internal Medicine

## 2016-01-17 DIAGNOSIS — I350 Nonrheumatic aortic (valve) stenosis: Secondary | ICD-10-CM | POA: Diagnosis not present

## 2016-01-17 DIAGNOSIS — L89153 Pressure ulcer of sacral region, stage 3: Secondary | ICD-10-CM

## 2016-01-17 DIAGNOSIS — Z789 Other specified health status: Secondary | ICD-10-CM | POA: Diagnosis not present

## 2016-01-17 DIAGNOSIS — F0391 Unspecified dementia with behavioral disturbance: Secondary | ICD-10-CM | POA: Diagnosis not present

## 2016-01-17 DIAGNOSIS — I5032 Chronic diastolic (congestive) heart failure: Secondary | ICD-10-CM | POA: Diagnosis not present

## 2016-01-17 DIAGNOSIS — R451 Restlessness and agitation: Secondary | ICD-10-CM

## 2016-01-17 DIAGNOSIS — R531 Weakness: Secondary | ICD-10-CM

## 2016-01-17 DIAGNOSIS — F03918 Unspecified dementia, unspecified severity, with other behavioral disturbance: Secondary | ICD-10-CM

## 2016-01-17 DIAGNOSIS — R627 Adult failure to thrive: Secondary | ICD-10-CM | POA: Diagnosis not present

## 2016-01-17 NOTE — Progress Notes (Signed)
Patient ID: Shawn Cortez, male   DOB: 1925-10-08, 82 y.o.   MRN: [id number]  Provider:  Jeanmarie Hubert, MD Location:  [facility] Room Number: 17 Place of Service:  SNF (31)  PCP: Penni Homans, MD Patient Care Team: Mosie Lukes, MD as PCP - General Rexene Alberts, MD as Consulting Physician (Cardiothoracic Surgery) Skeet Latch, MD as Attending Physician (Cardiology)  Extended Emergency Contact Information Primary Emergency Contact: Docia Barrier, [place] [address] Johnnette Litter of [place] Phone: [phone] Relation: Daughter Secondary Emergency Contact: Foye,Carol Address: [address]          [place], [place] [address] Johnnette Litter of [place] Phone: [phone] Mobile Phone: [phone] Relation: Spouse  Code Status: DNR. Hospice  Goals of Care: Advanced Directive information Advanced Directives 01/17/2016  Does patient have an advance directive? Yes  Type of Paramedic of [place];Living will;Out of facility DNR (pink MOST or yellow form)  Does patient want to make changes to advanced directive? -  Copy of advanced directive(s) in chart? Yes  Pre-existing out of facility DNR order (yellow form or pink MOST form) Yellow form placed in chart (order not valid for inpatient use)      Chief Complaint  Patient presents with  . New Admit To SNF    following hospitalization 12/22/15 to 12/25/15 with Acute on chronic diastolic heart failure. Admitted to skill unit and hospice and palliative care.    HPI: Patient is a 82 y.o. male seen today [facility] from 12/22/2015 through 12/25/2015.  Patient had acute on chronic diastolic heart failure likely related to a severe aortic stenosis for which she is not a surgical and chronic atrial fibrillation and is quite weak. He has been noted only palliative care/hospice. He has  dementia and adult failure to thrive. He has dysphagia  Because of urinary frequency has indwelling Foley catheter. This is necessary for incontinence problems and a sacral decubitus which is deteriorating.  Other problems include insomnia, a 30-40 pound weight loss in the last 6 months, coronary artery disease, hyperlipidemia, BPH, and constipation.  His wife is at bedside on today's exam. She has a good grasp of his declining situation and knows he is not expected to survive. His poor oral intake, dementia, bedridden state, and decubitus ulcer are all likely to get worse in this situation. He has a DO NOT RESUSCITATE patient. Measures for comfort care should be adhered to, but he is not a candidate for aggressive measures to try to rehabilitate him.  Past Medical History  Diagnosis Date  . Arthritis   . SOB (shortness of breath) on exertion   . Hyperlipidemia   . Stroke [facility])     TIA history 2005  . Hyperglycemia 01/04/2012  . Leg pain, right 03/01/2012  . Right leg weakness 03/01/2012  . Skin lesion of right arm 09/14/2012  . Abrasion of hand 09/14/2012  . Cerumen impaction 09/14/2012  . Unsteady gait 12/26/2012  . Insomnia 12/26/2012  . Bilateral groin pain 02/13/2013    L>R  . Peripheral edema 03/29/2013  . Preventative health care 06/26/2013  . AAA (abdominal aortic aneurysm) ([place])   . Atrial fibrillation ([place])   . Aortic stenosis, severe 07/02/2015  . Depression 06/17/2015  . New onset a-fib ([place]) 06/17/2015  . Chronic diastolic congestive heart failure ([place])   . Hereditary and idiopathic peripheral neuropathy 08/12/2010  .  SPINAL STENOSIS, LUMBAR 08/12/2010  . UNSTEADY GAIT 08/12/2010  . TIA 08/12/2010  . Coronary artery disease 08/01/2015    65% stenosis mid LAD   Past Surgical History  Procedure Laterality Date  . Thumb tendon severed and repaired      right, tendon snapped  . Carpal tunnel release      Right  . Achilles tendon repair      left, repaired w/ wire  . Brain  surgery      done by Dr Lizabeth Leyden for tinnitius, unsuccessful, only on right  . Inguinal herniorrhapy  years ago    right and then 2 on left  . Lumbar laminectomy      L3-L5 posterior laminectomy 2006  . Joint replacement      Bilateral total knee replacement  . Spine surgery    . Cardiac catheterization N/A 08/01/2015    Procedure: Right/Left Heart Cath and Coronary Angiography;  Surgeon: Burnell Blanks, MD;  Location: [facility];  Service: Cardiovascular;  Laterality: N/A;    reports that he quit smoking about 55 years ago. He has never used smokeless tobacco. He reports that he drinks about 8.4 oz of alcohol per week. He reports that he does not use illicit drugs. Social History   Social History  . Marital Status: Married    Spouse Name: N/A  . Number of Children: N/A  . Years of Education: N/A   Occupational History  . Retired    Social History Main Topics  . Smoking status: Former Smoker -- 3.00 packs/day    Quit date: 10/16/1960  . Smokeless tobacco: Never Used  . Alcohol Use: 8.4 oz/week    14 Cans of beer per week     Comment: 1-2 beer daily  . Drug Use: No  . Sexual Activity: No   Other Topics Concern  . Not on file   Social History Narrative    Functional Status Survey:    Family History  Problem Relation Age of Onset  . Other Mother     CHF  . Ulcers Mother     Venous stasis  . Varicose Veins Mother   . Other Father     CHF  . Cancer Sister     unknown  . Hip fracture Maternal Grandmother   . Pneumonia Maternal Grandmother     Health Maintenance  Topic Date Due  . ZOSTAVAX  04/24/1987  . INFLUENZA VACCINE  05/27/2016  . TETANUS/TDAP  08/06/2025  . PNA vac Low Risk Adult  Completed    Allergies  Allergen Reactions  . Niacin And Related Rash    Facial redness and Rash head to toe  . Omnipaque [Iohexol] Rash    Facial redness and flushing      Medication List       This list is accurate as of: 01/17/16  2:33 PM.  Always  use your most recent med list.               acetaminophen 500 MG tablet  Commonly known as:  TYLENOL  Take 500 mg by mouth every 8 (eight) hours as needed for mild pain or moderate pain.     bisacodyl 10 MG suppository  Commonly known as:  DULCOLAX  Place 10 mg rectally. As needed if no BM in 3 days     furosemide 40 MG tablet  Commonly known as:  LASIX  Take 0.5 tablets (20 mg total) by mouth daily.     GAS-X  PO  Take 1 tablet by mouth as needed.     haloperidol 5 MG tablet  Commonly known as:  HALDOL  Take 5 mg by mouth. One tablet every 8 hours as needed     ibuprofen 200 MG tablet  Commonly known as:  ADVIL,MOTRIN  Take 200 mg by mouth daily as needed (pain).     NUTRITIONAL SHAKE PLUS PROTEIN PO  Take 1 Can by mouth. Twice daily     feeding supplement Liqd  Take 1 Container by mouth. 237 ml twice daily     polyethylene glycol packet  Commonly known as:  MIRALAX / GLYCOLAX  Take 17 g by mouth at bedtime as needed for mild constipation.     polyvinyl alcohol 1.4 % ophthalmic solution  Commonly known as:  LIQUIFILM TEARS  Place 1 drop into both eyes. One drop twice daily as needed for dry/itching eyes     sennosides-docusate sodium 8.6-50 MG tablet  Commonly known as:  SENOKOT-S  Take 1 tablet by mouth at bedtime. Hold for loose stools     traZODone 50 MG tablet  Commonly known as:  DESYREL  Take 50 mg by mouth at bedtime. For sleep        Review of Systems  Constitutional: Positive for activity change, appetite change, fatigue and unexpected weight change. Negative for fever, chills and diaphoresis.       Frail. Thiin.  HENT: Negative for congestion, ear pain, hearing loss, rhinorrhea, sore throat, tinnitus, trouble swallowing and voice change.   Eyes:       Corrective lenses  Respiratory: Negative for cough, choking, chest tightness, shortness of breath and wheezing.   Cardiovascular: Negative for chest pain, palpitations and leg swelling.    Gastrointestinal: Negative for nausea, vomiting, abdominal pain, diarrhea, constipation and abdominal distention.  Endocrine: Negative for cold intolerance, heat intolerance, polydipsia, polyphagia and polyuria.  Genitourinary: Negative for dysuria, urgency, frequency and testicular pain.       Indwelling Foley catheter due to incontinence of urine and decubitus ulcer of the sacrum.  Musculoskeletal: Negative for myalgias, back pain, arthralgias, gait problem, neck pain and neck stiffness.  Skin: Negative for color change, pallor and rash.       Painful sacral decubitus ulcer.  Allergic/Immunologic: Negative.   Neurological: Positive for weakness. Negative for dizziness, tremors, seizures, syncope, facial asymmetry, speech difficulty, light-headedness, numbness and headaches.       Pain in BUE/hands. Significant dementia. Poorly responsive.  Hematological: Negative for adenopathy. Does not bruise/bleed easily.  Psychiatric/Behavioral: Positive for behavioral problems, confusion, sleep disturbance and decreased concentration. Negative for hallucinations and agitation. The patient is nervous/anxious.     Filed Vitals:   01/17/16 1410  BP: 100/66  Pulse: 60  Temp: 96.7 F (35.9 C)  Resp: 16  Height: 6\' 4"  (1.93 m)  Weight: 181 lb (82.101 kg)   Body mass index is 22.04 kg/(m^2). Physical Exam  Constitutional: He is oriented to person, place, and time. He appears well-developed and well-nourished. No distress.  Emaciated and poorly nourished in appearance  HENT:  Head: Normocephalic and atraumatic.  Right Ear: External ear normal.  Left Ear: External ear normal.  Nose: Nose normal.  Mouth/Throat: Oropharynx is clear and moist. No oropharyngeal exudate.  Eyes: Conjunctivae and EOM are normal. Pupils are equal, round, and reactive to light. Right eye exhibits no discharge. Left eye exhibits no discharge.  Neck: Normal range of motion. Neck supple. No JVD present. No tracheal deviation  present. No thyromegaly  present.  Cardiovascular: Normal rate, regular rhythm and intact distal pulses.  Exam reveals no gallop and no friction rub.   Murmur (3 /6 in aortic area) heard. Irregularly irregular  Pulmonary/Chest: Effort normal and breath sounds normal. No respiratory distress. He has no wheezes. He has no rales. He exhibits no tenderness.  Abdominal: Soft. Bowel sounds are normal. He exhibits no distension and no mass. There is no tenderness.  Genitourinary:  Indwelling Foley catheter.  Musculoskeletal: Normal range of motion. He exhibits no edema or tenderness.  Lymphadenopathy:    He has no cervical adenopathy.  Neurological: He is alert and oriented to person, place, and time. He has normal reflexes. No cranial nerve deficit. Coordination normal.  Skin: Skin is warm and dry. No rash noted. No erythema. No pallor.  Sacral decubitus, stage III.  Psychiatric:  Poorly responsive during today's exam  Nursing note and vitals reviewed.   Labs reviewed: Basic Metabolic Panel:  Recent Labs  12/23/15 0529 12/24/15 0358 12/25/15 0650  NA 136 133* 133*  K 3.9 3.4* 3.9  CL 101 95* 98*  CO2 25 26 26   GLUCOSE 86 100* 103*  BUN 18 19 17   CREATININE 0.99 1.18 0.94  CALCIUM 8.6* 8.5* 8.4*   Liver Function Tests:  Recent Labs  01/29/15 1609 06/04/15 1259 10/17/15 1641  AST 20 22 23   ALT 14 18 18   ALKPHOS 60 67 67  BILITOT 0.9 1.1 1.0  PROT 7.0 7.2 6.3  ALBUMIN 4.0 4.0 3.8   No results for input(s): LIPASE, AMYLASE in the last 8760 hours. No results for input(s): AMMONIA in the last 8760 hours. CBC:  Recent Labs  10/17/15 1641  12/22/15 2220 12/24/15 0358 12/25/15 0650  WBC 6.8  < > 3.5* 4.7 4.2  NEUTROABS 4.9  --   --   --   --   HGB 13.4  < > 12.4* 12.4* 13.5  HCT 40.6  < > 37.7* 37.7* 39.5  MCV 93.6  < > 92.9 90.8 91.9  PLT 158.0  < > 154 145* 148*  < > = values in this interval not displayed. Cardiac Enzymes:  Recent Labs  12/22/15 1450    TROPONINI 0.10*   BNP: Invalid input(s): POCBNP Lab Results  Component Value Date   HGBA1C 5.9 12/07/2015   Lab Results  Component Value Date   TSH 1.90 06/04/2015   Lab Results  Component Value Date   VITAMINB12 407 06/04/2015   Lab Results  Component Value Date   FOLATE >24.8 12/26/2011   Lab Results  Component Value Date   IRON 53 06/04/2015   TIBC 264 06/04/2015    Imaging and Procedures obtained prior to SNF admission: Dg Chest 2 View  12/22/2015  CLINICAL DATA:  Bilateral leg weakness.  Shortness of breath. EXAM: CHEST  2 VIEW COMPARISON:  Chest CT 08/13/2015 FINDINGS: There are small bilateral pleural effusions with bibasilar opacities, likely atelectasis. Cardiomegaly with vascular congestion. No overt edema. No acute bony abnormality. IMPRESSION: Cardiomegaly with vascular congestion, small bilateral effusions and bibasilar atelectasis. Electronically Signed   By: Rolm Baptise M.D.   On: 12/22/2015 15:46    Assessment/Plan.diagm 1. Weakness generalized Related to his adult failure to thrive. Patient severe aortic stenosis and heart failure. Oral intake has been ordered. Liquids are being thickened and he has taking some intake. Significant weight loss over the last 6 months has contributed to his generalized weakness. Due to his poor condition and declining state, there seems to be little  hope that he will "turn around" and be able to participate in any meaningful rehabilitation.  2. Chronic diastolic congestive heart failure (HCC) Appears to be compensated at this time  3. Aortic stenosis, severe Patient is not a candidate for valve replacement  4. Agitation During his hospitalization and the first several days. He was in the nursing home, he was quite agitated. His wife believes that the discomfort from his sacral decubitus was contributing to this agitation. He seems to calm down over the last few days.  5. Decubitus ulcer of sacral region, stage 3  (HCC) Sacral areas breaking down and the wound deepened substantially in the last week.  6. Dementia with behavioral disturbance Patient appears to have had a significant decline in his mental status over the last few weeks.  7. Active advance directive - DNR (Do Not Resuscitate)  8. Failure to thrive in adult We will provide measures for comfort care. Wife is stating that she does not want other aggressive measures at this time.

## 2016-01-31 ENCOUNTER — Telehealth: Payer: Self-pay | Admitting: *Deleted

## 2016-01-31 NOTE — Telephone Encounter (Signed)
Pt expired 02/10/2016 at 11:10pm

## 2016-02-25 DEATH — deceased

## 2016-08-20 ENCOUNTER — Other Ambulatory Visit (HOSPITAL_COMMUNITY): Payer: Medicare HMO

## 2016-08-20 ENCOUNTER — Ambulatory Visit: Payer: Medicare HMO | Admitting: Family
